# Patient Record
Sex: Female | Born: 1945 | Race: White | Hispanic: No | Marital: Married | Smoking: Current every day smoker
Health system: Southern US, Community
[De-identification: ages and names within clinical notes are randomized; demographics above are authoritative.]

## PROBLEM LIST (undated history)

## (undated) DIAGNOSIS — IMO0002 Reserved for concepts with insufficient information to code with codable children: Secondary | ICD-10-CM

## (undated) DIAGNOSIS — J449 Chronic obstructive pulmonary disease, unspecified: Secondary | ICD-10-CM

## (undated) DIAGNOSIS — M1990 Unspecified osteoarthritis, unspecified site: Secondary | ICD-10-CM

## (undated) DIAGNOSIS — I10 Essential (primary) hypertension: Secondary | ICD-10-CM

## (undated) DIAGNOSIS — G894 Chronic pain syndrome: Secondary | ICD-10-CM

## (undated) DIAGNOSIS — E039 Hypothyroidism, unspecified: Secondary | ICD-10-CM

## (undated) DIAGNOSIS — G4700 Insomnia, unspecified: Secondary | ICD-10-CM

## (undated) DIAGNOSIS — K219 Gastro-esophageal reflux disease without esophagitis: Secondary | ICD-10-CM

## (undated) DIAGNOSIS — F32A Depression, unspecified: Secondary | ICD-10-CM

## (undated) DIAGNOSIS — R002 Palpitations: Secondary | ICD-10-CM

## (undated) DIAGNOSIS — F329 Major depressive disorder, single episode, unspecified: Secondary | ICD-10-CM

## (undated) DIAGNOSIS — G459 Transient cerebral ischemic attack, unspecified: Secondary | ICD-10-CM

## (undated) DIAGNOSIS — G2581 Restless legs syndrome: Secondary | ICD-10-CM

## (undated) DIAGNOSIS — F419 Anxiety disorder, unspecified: Secondary | ICD-10-CM

## (undated) DIAGNOSIS — E785 Hyperlipidemia, unspecified: Secondary | ICD-10-CM

## (undated) HISTORY — DX: Gastro-esophageal reflux disease without esophagitis: K21.9

## (undated) HISTORY — DX: Restless legs syndrome: G25.81

## (undated) HISTORY — PX: CERVICAL SPINE SURGERY: SHX589

## (undated) HISTORY — DX: Reserved for concepts with insufficient information to code with codable children: IMO0002

## (undated) HISTORY — PX: CATARACT EXTRACTION, BILATERAL: SHX1313

## (undated) HISTORY — DX: Unspecified osteoarthritis, unspecified site: M19.90

## (undated) HISTORY — DX: Insomnia, unspecified: G47.00

## (undated) HISTORY — DX: Hyperlipidemia, unspecified: E78.5

## (undated) HISTORY — DX: Palpitations: R00.2

## (undated) HISTORY — DX: Transient cerebral ischemic attack, unspecified: G45.9

## (undated) HISTORY — DX: Anxiety disorder, unspecified: F41.9

## (undated) HISTORY — DX: Chronic pain syndrome: G89.4

## (undated) HISTORY — PX: APPENDECTOMY: SHX54

## (undated) HISTORY — PX: LUMBAR SPINE SURGERY: SHX701

## (undated) HISTORY — DX: Essential (primary) hypertension: I10

## (undated) HISTORY — PX: ABDOMINAL HYSTERECTOMY: SHX81

---

## 1998-04-08 ENCOUNTER — Inpatient Hospital Stay (HOSPITAL_COMMUNITY): Admission: RE | Admit: 1998-04-08 | Discharge: 1998-04-09 | Payer: Self-pay | Admitting: Neurosurgery

## 1999-07-26 ENCOUNTER — Ambulatory Visit (HOSPITAL_BASED_OUTPATIENT_CLINIC_OR_DEPARTMENT_OTHER): Admission: RE | Admit: 1999-07-26 | Discharge: 1999-07-26 | Payer: Self-pay | Admitting: Orthopedic Surgery

## 1999-08-09 ENCOUNTER — Ambulatory Visit (HOSPITAL_COMMUNITY): Admission: RE | Admit: 1999-08-09 | Discharge: 1999-08-09 | Payer: Self-pay | Admitting: Orthopedic Surgery

## 1999-08-09 ENCOUNTER — Encounter: Payer: Self-pay | Admitting: Orthopedic Surgery

## 1999-09-11 ENCOUNTER — Ambulatory Visit (HOSPITAL_COMMUNITY): Admission: RE | Admit: 1999-09-11 | Discharge: 1999-09-11 | Payer: Self-pay | Admitting: Neurosurgery

## 1999-10-06 ENCOUNTER — Ambulatory Visit (HOSPITAL_COMMUNITY): Admission: RE | Admit: 1999-10-06 | Discharge: 1999-10-06 | Payer: Self-pay | Admitting: Neurosurgery

## 1999-10-25 ENCOUNTER — Encounter: Admission: RE | Admit: 1999-10-25 | Discharge: 1999-10-25 | Payer: Self-pay | Admitting: Neurosurgery

## 2000-02-07 ENCOUNTER — Ambulatory Visit (HOSPITAL_COMMUNITY): Admission: RE | Admit: 2000-02-07 | Discharge: 2000-02-07 | Payer: Self-pay | Admitting: Neurosurgery

## 2000-12-21 ENCOUNTER — Encounter: Admission: RE | Admit: 2000-12-21 | Discharge: 2001-03-21 | Payer: Self-pay | Admitting: Neurosurgery

## 2002-07-24 ENCOUNTER — Inpatient Hospital Stay (HOSPITAL_COMMUNITY): Admission: RE | Admit: 2002-07-24 | Discharge: 2002-07-26 | Payer: Self-pay | Admitting: Neurosurgery

## 2003-06-25 ENCOUNTER — Ambulatory Visit (HOSPITAL_COMMUNITY): Admission: RE | Admit: 2003-06-25 | Discharge: 2003-06-25 | Payer: Self-pay | Admitting: Neurosurgery

## 2005-09-01 ENCOUNTER — Ambulatory Visit: Payer: Self-pay | Admitting: Internal Medicine

## 2005-10-13 ENCOUNTER — Ambulatory Visit: Payer: Self-pay | Admitting: Internal Medicine

## 2005-10-13 ENCOUNTER — Encounter (INDEPENDENT_AMBULATORY_CARE_PROVIDER_SITE_OTHER): Payer: Self-pay | Admitting: Specialist

## 2005-12-16 ENCOUNTER — Emergency Department (HOSPITAL_COMMUNITY): Admission: EM | Admit: 2005-12-16 | Discharge: 2005-12-16 | Payer: Self-pay | Admitting: *Deleted

## 2006-01-08 ENCOUNTER — Inpatient Hospital Stay (HOSPITAL_COMMUNITY): Admission: RE | Admit: 2006-01-08 | Discharge: 2006-01-10 | Payer: Self-pay | Admitting: Neurosurgery

## 2008-02-05 ENCOUNTER — Ambulatory Visit (HOSPITAL_COMMUNITY): Admission: RE | Admit: 2008-02-05 | Discharge: 2008-02-05 | Payer: Self-pay | Admitting: Neurosurgery

## 2008-02-19 ENCOUNTER — Ambulatory Visit (HOSPITAL_COMMUNITY): Admission: RE | Admit: 2008-02-19 | Discharge: 2008-02-19 | Payer: Self-pay | Admitting: Neurosurgery

## 2008-04-16 ENCOUNTER — Inpatient Hospital Stay (HOSPITAL_COMMUNITY): Admission: RE | Admit: 2008-04-16 | Discharge: 2008-04-20 | Payer: Self-pay | Admitting: Neurosurgery

## 2008-10-21 ENCOUNTER — Ambulatory Visit: Payer: Self-pay | Admitting: Internal Medicine

## 2008-11-02 ENCOUNTER — Ambulatory Visit: Payer: Self-pay | Admitting: Internal Medicine

## 2008-11-02 ENCOUNTER — Encounter: Payer: Self-pay | Admitting: Internal Medicine

## 2008-11-04 ENCOUNTER — Encounter: Payer: Self-pay | Admitting: Internal Medicine

## 2008-11-06 ENCOUNTER — Encounter: Admission: RE | Admit: 2008-11-06 | Discharge: 2008-11-06 | Payer: Self-pay | Admitting: Neurosurgery

## 2008-12-29 ENCOUNTER — Encounter (INDEPENDENT_AMBULATORY_CARE_PROVIDER_SITE_OTHER): Payer: Self-pay | Admitting: *Deleted

## 2009-01-25 ENCOUNTER — Ambulatory Visit: Payer: Self-pay | Admitting: Internal Medicine

## 2009-02-11 ENCOUNTER — Encounter: Payer: Self-pay | Admitting: Internal Medicine

## 2009-02-11 ENCOUNTER — Ambulatory Visit: Payer: Self-pay | Admitting: Internal Medicine

## 2009-02-15 ENCOUNTER — Encounter: Payer: Self-pay | Admitting: Internal Medicine

## 2009-03-22 ENCOUNTER — Inpatient Hospital Stay (HOSPITAL_COMMUNITY): Admission: AD | Admit: 2009-03-22 | Discharge: 2009-03-24 | Payer: Self-pay | Admitting: Internal Medicine

## 2009-03-23 ENCOUNTER — Ambulatory Visit: Payer: Self-pay | Admitting: Vascular Surgery

## 2009-03-23 ENCOUNTER — Encounter (INDEPENDENT_AMBULATORY_CARE_PROVIDER_SITE_OTHER): Payer: Self-pay | Admitting: *Deleted

## 2009-05-31 ENCOUNTER — Encounter: Admission: RE | Admit: 2009-05-31 | Discharge: 2009-05-31 | Payer: Self-pay | Admitting: Neurosurgery

## 2009-09-06 ENCOUNTER — Encounter: Admission: RE | Admit: 2009-09-06 | Discharge: 2009-09-06 | Payer: Self-pay | Admitting: Neurological Surgery

## 2010-01-18 ENCOUNTER — Encounter: Admission: RE | Admit: 2010-01-18 | Discharge: 2010-01-18 | Payer: Self-pay | Admitting: Neurosurgery

## 2010-02-07 ENCOUNTER — Encounter: Admission: RE | Admit: 2010-02-07 | Discharge: 2010-02-07 | Payer: Self-pay | Admitting: Neurosurgery

## 2010-03-25 ENCOUNTER — Encounter: Admission: RE | Admit: 2010-03-25 | Discharge: 2010-03-25 | Payer: Self-pay | Admitting: Neurosurgery

## 2010-08-18 ENCOUNTER — Encounter: Payer: Self-pay | Admitting: Nurse Practitioner

## 2010-10-20 ENCOUNTER — Encounter: Payer: Self-pay | Admitting: Nurse Practitioner

## 2010-10-24 ENCOUNTER — Encounter (INDEPENDENT_AMBULATORY_CARE_PROVIDER_SITE_OTHER): Payer: Self-pay | Admitting: *Deleted

## 2010-10-25 ENCOUNTER — Telehealth: Payer: Self-pay | Admitting: Internal Medicine

## 2010-10-27 ENCOUNTER — Encounter: Payer: Self-pay | Admitting: Internal Medicine

## 2010-10-27 ENCOUNTER — Ambulatory Visit
Admission: RE | Admit: 2010-10-27 | Discharge: 2010-10-27 | Payer: Self-pay | Source: Home / Self Care | Attending: Gastroenterology | Admitting: Gastroenterology

## 2010-10-27 DIAGNOSIS — K219 Gastro-esophageal reflux disease without esophagitis: Secondary | ICD-10-CM | POA: Insufficient documentation

## 2010-10-27 DIAGNOSIS — K146 Glossodynia: Secondary | ICD-10-CM | POA: Insufficient documentation

## 2010-10-27 DIAGNOSIS — R1084 Generalized abdominal pain: Secondary | ICD-10-CM | POA: Insufficient documentation

## 2010-10-27 DIAGNOSIS — G8929 Other chronic pain: Secondary | ICD-10-CM | POA: Insufficient documentation

## 2010-10-27 DIAGNOSIS — Z860100 Personal history of colon polyps, unspecified: Secondary | ICD-10-CM | POA: Insufficient documentation

## 2010-10-27 DIAGNOSIS — R1319 Other dysphagia: Secondary | ICD-10-CM | POA: Insufficient documentation

## 2010-10-27 DIAGNOSIS — M545 Low back pain, unspecified: Secondary | ICD-10-CM | POA: Insufficient documentation

## 2010-10-28 ENCOUNTER — Telehealth: Payer: Self-pay | Admitting: Nurse Practitioner

## 2010-11-04 ENCOUNTER — Encounter
Admission: RE | Admit: 2010-11-04 | Discharge: 2010-11-04 | Payer: Self-pay | Source: Home / Self Care | Attending: Neurosurgery | Admitting: Neurosurgery

## 2010-11-15 ENCOUNTER — Other Ambulatory Visit: Payer: Self-pay | Admitting: Internal Medicine

## 2010-11-15 ENCOUNTER — Ambulatory Visit
Admission: RE | Admit: 2010-11-15 | Discharge: 2010-11-15 | Payer: Self-pay | Source: Home / Self Care | Attending: Internal Medicine | Admitting: Internal Medicine

## 2010-11-15 DIAGNOSIS — D1330 Benign neoplasm of unspecified part of small intestine: Secondary | ICD-10-CM

## 2010-11-17 NOTE — Letter (Signed)
Summary: New Patient letter  [facility]  [address] [place], [place] [address]   Phone: [phone]  Fax: [phone]       10/24/2010 MRN: [id number]  [facility] [address] [place], [place]  [address]  Dear Ms. Pantaleon,  Welcome to the Gastroenterology Division at [facility].    You are scheduled to see Dr.  Marina Goodell on 12-05-10 at 11:00a.m. on the 3rd floor at [facility], [address]  We ask that you try to arrive at our office 15 minutes prior to your appointment time to allow for check-in.  We would like you to complete the enclosed self-administered evaluation form prior to your visit and bring it with you on the day of your appointment.  We will review it with you.  Also, please bring a complete list of all your medications or, if you prefer, bring the medication bottles and we will list them.  Please bring your insurance card so that we may make a copy of it.  If your insurance requires a referral to see a specialist, please bring your referral form from your primary care physician.  Co-payments are due at the time of your visit and may be paid by cash, check or credit card.     Your office visit will consist of a consult with your physician (includes a physical exam), any laboratory testing he/she may order, scheduling of any necessary diagnostic testing (e.g. x-ray, ultrasound, CT-scan), and scheduling of a procedure (e.g. Endoscopy, Colonoscopy) if required.  Please allow enough time on your schedule to allow for any/all of these possibilities.    If you cannot keep your appointment, please call [phone] to cancel or reschedule prior to your appointment date.  This allows [place] the opportunity to schedule an appointment for another patient in need of care.  If you do not cancel or reschedule by 5 p.m. the business day prior to your appointment date, you will be charged a $50.00 late cancellation/no-show fee.    Thank you for choosing [facility] for your medical needs.  We appreciate the opportunity to care for you.  Please visit [place] at our website  to learn more about our practice.                     Sincerely,                                                             The Gastroenterology Division

## 2010-11-17 NOTE — Progress Notes (Signed)
Summary: Triage  Phone Note Call from Patient Call back at Home Phone [phone]   Caller: Patient Call For: Dr. Marina Goodell Reason for Call: Talk to Nurse Summary of Call: Abd pain, vomiting, rash on her tongue. Seen ENT and it is not working. Requesting sooner appt. than her appt on 12-05-10 Initial call taken by: Karna Christmas,  October 25, 2010 9:12 AM  Follow-up for Phone Call        Given appt. with N..P. for Thursday.She will have records from ENT faxed to [facility]. Follow-up by: Teryl Lucy RN,  October 25, 2010 10:41 AM

## 2010-11-17 NOTE — Progress Notes (Signed)
Summary: Prep ?s  Phone Note Call from Patient Call back at Home Phone [phone]   Caller: Patient Call For: Gunnar Fusi Reason for Call: Talk to Nurse Summary of Call: Pt says she can not drink her prep she wants another option Initial call taken by: [place] Johnson,  October 28, 2010 10:25 AM  Follow-up for Phone Call        Pt complained about the cost of the Prep. She was told by the pharmacy it was 67.00 dollars.  I told her it is the prep the Doctor's here prefer because it really cleans out the colon if the pt drinks the entire prep.  I told her we do not  have a sample for everyone.  I did tell her I would put 1 at the front desk for her.  She said it would be Monday before she gets here. She was grateful for the sample. Follow-up by: Joselyn Glassman,  October 28, 2010 11:35 AM

## 2010-11-17 NOTE — Letter (Signed)
Summary: [facility] Instructions  [facility]  [address] [place], [place] [address]   Phone: [phone]  Fax: [phone]       Emily Leonard    [birth year]    MRN: [id number]        Procedure Day /Date:11-15-2010     Arrival Time:9:30 AM      Procedure Time:10:30 AM     Location of Procedure:                         [facility] (4th Floor)  PREPARATION FOR COLONOSCOPY WITH MOVIPREP   Starting 5 days prior to your procedure 11-10-2010  do not eat nuts, seeds, popcorn, corn, beans, peas,  salads, or any raw vegetables.  Do not take any fiber supplements (e.g. Metamucil, Citrucel, and Benefiber).  THE DAY BEFORE YOUR PROCEDURE         DATE:11-09-2010 DAY: Monday  1.  Drink clear liquids the entire day-NO SOLID FOOD  2.  Do not drink anything colored red or purple.  Avoid juices with pulp.  No orange juice.  3.  Drink at least 64 oz. (8 glasses) of fluid/clear liquids during the day to prevent dehydration and help the prep work efficiently.  CLEAR LIQUIDS INCLUDE: Water Jello Ice Popsicles Tea (sugar ok, no milk/cream) Powdered fruit flavored drinks Coffee (sugar ok, no milk/cream) Gatorade Juice: apple, white grape, white cranberry  Lemonade Clear bullion, consomm, broth Carbonated beverages (any kind) Strained chicken noodle soup Hard Candy                             4.  In the morning, mix first dose of MoviPrep solution:    Empty 1 Pouch A and 1 Pouch B into the disposable container    Add lukewarm drinking water to the top line of the container. Mix to dissolve    Refrigerate (mixed solution should be used within 24 hrs)  5.  Begin drinking the prep at 5:00 p.m. The MoviPrep container is divided by 4 marks.   Every 15 minutes drink the solution down to the next mark (approximately 8 oz) until the full liter is complete.   6.  Follow completed prep with 16 oz of clear liquid of your choice (Nothing red or purple).  Continue to  drink clear liquids until bedtime.  7.  Before going to bed, mix second dose of MoviPrep solution:    Empty 1 Pouch A and 1 Pouch B into the disposable container    Add lukewarm drinking water to the top line of the container. Mix to dissolve    Refrigerate  THE DAY OF YOUR PROCEDURE      DATE: 11-15-2010 DAY: Tuesday  Beginning at 5:30 A.M.  (5 hours before procedure):         1. Every 15 minutes, drink the solution down to the next mark (approx 8 oz) until the full liter is complete.  2. Follow completed prep with 16 oz. of clear liquid of your choice.    3. You may drink clear liquids until 8:30 AM  (2 HOURS BEFORE PROCEDURE).    MEDICATION INSTRUCTIONS  Unless otherwise instructed, you should take regular prescription medications with a small sip of water   as early as possible the morning of your procedure.        OTHER INSTRUCTIONS  You will need a responsible adult at least 66  years of age to accompany you and drive you home.   This person must remain in the waiting room during your procedure.  Wear loose fitting clothing that is easily removed.  Leave jewelry and other valuables at home.  However, you may wish to bring a book to read or  an iPod/MP3 player to listen to music as you wait for your procedure to start.  Remove all body piercing jewelry and leave at home.  Total time from sign-in until discharge is approximately 2-3 hours.  You should go home directly after your procedure and rest.  You can resume normal activities the  day after your procedure.  The day of your procedure you should not:   Drive   Make legal decisions   Operate machinery   Drink alcohol   Return to work  You will receive specific instructions about eating, activities and medications before you leave.    The above instructions have been reviewed and explained to me by   _______________________    I fully understand and can verbalize these instructions  _____________________________ Date _________

## 2010-11-17 NOTE — Assessment & Plan Note (Signed)
Summary: dysphagia--ch./Dr.perry pt.   History of Present Illness Visit Type: Initial Consult Primary GI MD: Yancey Flemings MD Primary Provider: Philemon Kingdom, MD Requesting Provider: Philemon Kingdom, MD Chief Complaint: dysphagia History of Present Illness:   Patient followed by Dr. Marina Goodell for history of colon polyps. She was last seen at the time of her colonoscopy April 2010.   Patient presents today with several GI problems. First, she complains of a constantly burning tongue. Her tongue feels raw and intermittently feels swollen.  She can not correlate onset of symptoms with any of her medications. The only medication started since onset of symptoms a few months ago was Hydromorphone. Saw an ENT in [place] (she doesn't remember who) and was given Dukes Mouthwash but failed to improved. She complains of solid food getting stuck high up in her throat. The food sometimes comes back in her mouth. She complains of pyrosis. She takes Nexium on a daily basis.  Patient complains of nagging mid abdominal pain, not necessarily worse with meals. Pain several months ago, around the same time as her upper GI problems. BMs are normal. No rectal bleeding. Her weight is up several pounds.    GI Review of Systems    Reports abdominal pain, acid reflux, belching, chest pain, dysphagia with liquids, dysphagia with solids, and  nausea.     Location of  Abdominal pain: mid abdomen.    Denies bloating, heartburn, loss of appetite, vomiting, vomiting blood, weight loss, and  weight gain.        Denies anal fissure, black tarry stools, change in bowel habit, constipation, diarrhea, diverticulosis, fecal incontinence, heme positive stool, hemorrhoids, irritable bowel syndrome, jaundice, light color stool, liver problems, rectal bleeding, and  rectal pain. Preventive Screening-Counseling & Management      Drug Use:  no.      Current Medications (verified): 1)  Fluoxetine Hcl 20 Mg Caps (Fluoxetine Hcl)  .... 3 Caps in The Am , Orally Once Daily 2)  Calcium 600 Mg Tabs (Calcium) .... Take 2 Tab Daily 3)  Fish Oil 1000 Mg Caps (Omega-3 Fatty Acids) .... Take 1 Cap Daily 4)  Aspirin 81 Mg Tbec (Aspirin) .... Take 1 Tab Daily 5)  Synthroid 112 Mcg Tabs (Levothyroxine Sodium) .... Take 1 Tab in The Am On Empty Stomach 6)  Meloxicam 15 Mg Tabs (Meloxicam) .... Take 1 Tab Daily For Pain and Inflammation 7)  Pravastatin Sodium 20 Mg Tabs (Pravastatin Sodium) .... Take 1 Tab The Am 8)  Alendronate Sodium 70 Mg Tabs (Alendronate Sodium) .... Take 1 Tab Once A Week For Bones 9)  Orphenadrine Citrate Cr 100 Mg Xr12h-Tab (Orphenadrine Citrate) .... Take 1 Tab Every 12 Hours As Needed For Leg Cramps 10)  Estradiol 1 Mg Tabs (Estradiol) .... Take 1 Tab Daily 11)  Ranitidine Hcl 150 Mg Caps (Ranitidine Hcl) .... Take 1 Tab Twice Daily 12)  Hydrochlorothiazide 25 Mg Tabs (Hydrochlorothiazide) .... Take 1 Tab Once Daily 13)  Neurontin 600 Mg Tabs (Gabapentin) .... Take 1 Cap 3 Times Daily For Nerve Pain 14)  Robaxin-750 750 Mg Tabs (Methocarbamol) .... Take 1 Tab 4 Times Daily As Needed 15)  Nexium 40 Mg Cpdr (Esomeprazole Magnesium) .... Take 1 Cap Once Daily 16)  Trazodone Hcl 50 Mg Tabs (Trazodone Hcl) .... Take 1-3 Tab At Bedtime Orally Once Daily 17)  Clonazepam 1 Mg Tabs (Clonazepam) .... Take 1 Tab Orally Once Daily 18)  Hydromorphone Hcl 4 Mg Tabs (Hydromorphone Hcl) .... Take As Directed 19)  Ropinirole  Hcl 2 Mg Tabs (Ropinirole Hcl) .... Take 1 Tab Once Daily  Allergies (verified): 1)  ! Erythromycin 2)  ! * Latex 3)  ! Morphine Sulfate (Morphine Sulfate)  Past History:  Past Medical History: Hypertension TIA Thyroid Disease Arthritis History of UTI Anxiety Disorder Tubular Adenoma- Colon Polyps Depression  Past Surgical History: Reviewed history from 10/26/2010 and no changes required. Hysterectomy 1978 Appendectomy 2010 Lumbar spine surgery fusion Cervical spine surgery  fusion  Family History: Reviewed history from 10/26/2010 and no changes required. Father- deceased 74 years, Hypertension, MI Mother- deceased 1 years Hypertension, stroke, breast cancer Son- 1 alive Daughters- 2 alive Spouse- alive 5 yrs No FH of Colon Cancer:  Social History: Reviewed history from 10/26/2010 and no changes required. Smoked 41 years- smokes 31 cig a day Alcohol Use - no Daily Caffeine Use  3 per day Illicit Drug Use - no Married 2 boys and 2 girlsDrug Use:  no  Review of Systems       The patient complains of arthritis/joint pain, back pain, change in vision, cough, heart rhythm changes, muscle pains/cramps, shortness of breath, and sore throat.  The patient denies allergy/sinus, anemia, blood in urine, breast changes/lumps, confusion, coughing up blood, fainting, fatigue, fever, headaches-new, hearing problems, heart murmur, itching, menstrual pain, night sweats, nosebleeds, pregnancy symptoms, skin rash, sleeping problems, swelling of feet/legs, swollen lymph glands, thirst - excessive , urination - excessive , urination changes/pain, urine leakage, vision changes, and voice change.    Vital Signs:  Patient profile:   65 year old female Height:      64 inches Weight:      155.4 pounds BMI:     26.77 Pulse rate:   64 / minute Pulse rhythm:   regular BP sitting:   122 / 68  (left arm)  Vitals Entered By: Milford Cage NCMA (October 27, 2010 8:24 AM)  Physical Exam  General:  Well developed, well nourished, no acute distress. Head:  Normocephalic and atraumatic. Eyes:  Conjunctiva pink, no icterus.  Mouth:  No deformity or lesions Neck:  no obvious masses  Lungs:  Clear throughout to auscultation. Heart:  Regular rate and rhythm; no murmurs, rubs,  or bruits. Abdomen:  Abdomen soft,  nondistended. Mild periumbilical tenderness.  No obvious masses or hepatomegaly.Normal bowel sounds.  Msk:  Symmetrical with no gross deformities. Normal  posture. Extremities:  No palmar erythema, no edema.  Neurologic:  Alert and  oriented x4;  grossly normal neurologically. Skin:  Intact without significant lesions or rashes. Cervical Nodes:  No significant cervical adenopathy. Psych:  Alert and cooperative. Normal mood and affect.   Impression & Recommendations:  Problem # 1:  DYSPHAGIA (ZOX-096.04) Assessment New Several month history of solid food dysphagia. On daily Nexium. She is having breakthrough pyrosis, burning tongue. Trial of Dukes Mouthwash by ENT was not helpful. We are trying to determine which ENT patient saw and obtain those records. For further evaluation the patient will be scheduled for an EGD with biopsies/ esophageal dilation ( if indicated).  The risks and benefits of the procedure, as well as alternatives were discussed with the patient and she agrees to proceed. Of note, the patient is chronic pain medications and may need Propofol for sedation. I will talk with Dr. Marina Goodell, the patient's primary gastroenterologist, and see what he recommends as far as sedation.   Orders: Colon/Endo (Colon/Endo)  Problem # 2:  GERD (ICD-530.81) Assessment: Deteriorated Breakthrough GERD symptoms on daily Nexium.  Her pyrosis is in  the setting of dysphagia and complaints of a burning tongue. See #1.   Problem # 3:  GLOSSODYNIA (ICD-529.6) Assessment: New  Previously evaluated by ENT. No obvious tongue abnormalities on exam (normal appearing papillae, normal color, normal size). It could be one of her medications. She could have a vitamin deficiency but again her tongue appears normal on exam. Etiology is not clear to me.  Problem # 4:  ABDOMINAL PAIN -GENERALIZED (ICD-789.07) Assessment: New Mid abdominal discomfort (nagging), unrelated to meals or defecation.  Pain doesn't wake her up at night, her weight is stable, BMs normal. Her abdominal exam is not overly concerning. Patient does take a daily NSAID and PUD is a consideration  but location of pain atypical for PUD.  If pain persists and EGD is negative, she may need imaging.   Problem # 5:  PERSONAL HX COLONIC POLYPS (ICD-V12.72) Assessment: Comment Only Patient has a history of adenomatous colon polyps and is due for surveillance examination in April 2012. If patient requires Propofol for her upcoming EGD Dr. Marina Goodell may wish to do her colonoscopy at the same time. I will discuss this him.   Problem # 6:  BACK PAIN, CHRONIC (ICD-724.5) Assessment: Comment Only On chronic pain medications and muscle relaxers.  Patient Instructions: 1)  Chew your food well. 2)  We have scheduled the Endoscopy/Colonoscopy with Dr. Marina Goodell on 10-28-2010.Marland Kitchen 3)  Directions and brochure provided. 4)  [facility] Patient Information Guide given to patient. 5)  We sent the perscription for the colonoscopy prep you will be drinking to [facility], [place].   6)  We have given you a mail-in rebate for $20 off your prep.  7)  Copy sent to : Dr. Philemon Kingdom 8)  The medication list was reviewed and reconciled.  All changed / newly prescribed medications were explained.  A complete medication list was provided to the patient / caregiver. Prescriptions: MOVIPREP 100 GM  SOLR (PEG-KCL-NACL-NASULF-NA ASC-C) As per prep instructions.  #1 x 0   Entered by:   Lowry Ram NCMA   Authorized by:   Willette Cluster NP   Signed by:   Lowry Ram NCMA on 10/27/2010   Method used:   Electronically to        [facility] [id number] (retail)       [address] [place] [address] [place], [place]  [address]       Ph: [phone]       Fax: [phone]   RxID:   [id number]

## 2010-11-17 NOTE — Letter (Signed)
Summary: Orest Dikes MD/[facility]  Orest Dikes MD/[facility]   Imported By: Lester [place] 11/07/2010 08:12:11  _____________________________________________________________________  External Attachment:    Type:   Image     Comment:   External Document

## 2010-11-17 NOTE — Letter (Signed)
Summary: Cruzita Lederer MD/[facility]  Cruzita Lederer MD/[facility]   Imported By: Lester [place] 11/07/2010 08:13:52  _____________________________________________________________________  External Attachment:    Type:   Image     Comment:   External Document

## 2010-11-21 ENCOUNTER — Encounter: Payer: Self-pay | Admitting: Internal Medicine

## 2010-11-23 NOTE — Miscellaneous (Signed)
Summary: trial of Mycelex  Clinical Lists Changes  Medications: Added new medication of MYCELEX 10 MG TROC (CLOTRIMAZOLE) One 5x daily until gone - Signed Rx of MYCELEX 10 MG TROC (CLOTRIMAZOLE) One 5x daily until gone;  #50 x 0;  Signed;  Entered by: Doristine Church RN II;  Authorized by: Hilarie Fredrickson MD;  Method used: Electronically to [facility] [address] [place] [address] [place], [place]  [address], Ph: [phone], Fax: [phone] Observations: Added new observation of ALLERGY REV: Done (11/15/2010 11:36)    Prescriptions: MYCELEX 10 MG TROC (CLOTRIMAZOLE) One 5x daily until gone  #50 x 0   Entered by:   Doristine Church RN II   Authorized by:   Hilarie Fredrickson MD   Signed by:   Doristine Church RN II on 11/15/2010   Method used:   Electronically to        [facility] [id number] (retail)       [address] [place] [address] [place], [place]  [address]       Ph: [phone]       Fax: [phone]   RxID:   [id number]

## 2010-11-23 NOTE — Procedures (Addendum)
Summary: Upper Endoscopy  Patient: Meilin Brosh Note: All result statuses are Final unless otherwise noted.  Tests: (1) Upper Endoscopy (EGD)   EGD Upper Endoscopy       DONE     [facility]     [address]     [place], [place]  [address]           ENDOSCOPY PROCEDURE REPORT           PATIENT:  Emmajean, Ratledge  MR#:  [id number]     BIRTHDATE:  [birth year], 64 yrs. old  GENDER:  female           ENDOSCOPIST:  Wilhemina Bonito. Eda Keys, MD     Referred by:  Office           PROCEDURE DATE:  11/15/2010     PROCEDURE:  EGD, diagnostic 19147     ASA CLASS:  Class II     INDICATIONS:  GERD, dysphagia ; burning tongue           MEDICATIONS:   MAC sedation, administered by CRNA, propofol     (Diprivan) 140 mg IV     TOPICAL ANESTHETIC:  Exactacain Spray           DESCRIPTION OF PROCEDURE:   After the risks benefits and     alternatives of the procedure were thoroughly explained, informed     consent was obtained.  The LB [id number]-H180 [id number] endoscope was     introduced through the mouth and advanced to the second portion of     the duodenum, without limitations.  The instrument was slowly     withdrawn as the mucosa was fully examined.     <<PROCEDUREIMAGES>>           The upper, middle, and distal third of the esophagus were     carefully inspected and no abnormalities were noted. The z-line     was well seen at the GEJ. The endoscope was pushed into the fundus     which was normal including a retroflexed view. The antrum,gastric     body, first and second part of the duodenum were unremarkable.     Retroflexed views revealed no abnormalities.    The scope was then     withdrawn from the patient and the procedure completed.           COMPLICATIONS:  None           ENDOSCOPIC IMPRESSION:     1) Normal EGD     2) Gerd           RECOMMENDATIONS:     1) Continue Reflux medication     2) Trial of Mycelex troches; #50; 0ne 5x daily until gone     3) See your ENT doctor if symptoms  presist           ______________________________     Wilhemina Bonito. Eda Keys, MD           CC:  The Patient; Philemon Kingdom MD           n.     eSIGNED:   Wilhemina Bonito. Eda Keys at 11/15/2010 11:28 AM           Arty Baumgartner, [phone]  Note: An exclamation mark (!) indicates a result that was not dispersed into the flowsheet. Document Creation Date: 11/15/2010 11:28 AM _______________________________________________________________________  (1) Order result status: Final Collection or observation date-time: 11/15/2010 11:11 Requested date-time:  Receipt date-time:  Reported date-time:  Referring Physician:   Ordering Physician: Fransico Setters [phone]) Specimen Source:  Source: Launa Grill Order Number: [id number] Lab site:

## 2010-11-23 NOTE — Procedures (Addendum)
Summary: Colonoscopy  Patient: Emily Leonard Note: All result statuses are Final unless otherwise noted.  Tests: (1) Colonoscopy (COL)   COL Colonoscopy           DONE     [facility]     [address]     [place], [place]  [address]           COLONOSCOPY PROCEDURE REPORT           PATIENT:  [name], [name]  MR#:  [id number]     BIRTHDATE:  [birth year], 64 yrs. old  GENDER:  female     ENDOSCOPIST:  Wilhemina Bonito. Eda Keys, MD     REF. BY:  Office     PROCEDURE DATE:  11/15/2010     PROCEDURE:  Colonoscopy with snare polypectomy X 2     ASA CLASS:  Class II     INDICATIONS:  history of pre-cancerous (adenomatous) colon polyps,     surveillance and high-risk screening ; PRIOR EXAMS 2003,6,10,10 W/     MULTIPLE AND LARGE POLYPS     MEDICATIONS:   MAC sedation, administered by CRNA, propofol     (Diprivan) 230 mg IV           DESCRIPTION OF PROCEDURE:   After the risks benefits and     alternatives of the procedure were thoroughly explained, informed     consent was obtained.  Digital rectal exam was performed and     revealed no abnormalities.   The LB 180AL [id number] endoscope was     introduced through the anus and advanced to the cecum, which was     identified by both the appendix and ileocecal valve, without     limitations.TIME TO CECUM = 5:29 MIN.  The quality of the prep was     good, using MoviPrep.  The instrument was then slowly withdrawn     (TIME = 16:08 MIN) as the colon was fully examined.     <<PROCEDUREIMAGES>>           FINDINGS:  An 8mm sessile polyp was found in the cecum. Polyp was     snared without cautery. Retrieval was successful.   A diminutive     polyp was found in the mid transverse colon. Polyp was snared     without cautery. Retrieval was successful.  Moderate     diverticulosis was found throughout the colon.   Retroflexed views     in the rectum revealed no abnormalities.    The scope was then     withdrawn from the patient and the procedure  completed.           COMPLICATIONS:  None     ENDOSCOPIC IMPRESSION:     1) Sessile polyp in the cecum - removed     2) Diminutive polyp in the mid transverse colon - removed     3) Moderate diverticulosis throughout the colon           RECOMMENDATIONS:     1) Repeat Colonoscopy in 3 years.     ______________________________     Wilhemina Bonito. Eda Keys, MD           CC:  The Patient;  Philemon Kingdom  MD           n.     eSIGNED:   Wilhemina Bonito. Eda Keys at 11/15/2010 11:20 AM           Arty Baumgartner, [id number]  Note: An exclamation mark (!) indicates a result that was not dispersed into the flowsheet. Document Creation Date: 11/15/2010 11:21 AM _______________________________________________________________________  (1) Order result status: Final Collection or observation date-time: 11/15/2010 11:04 Requested date-time:  Receipt date-time:  Reported date-time:  Referring Physician:   Ordering Physician: Fransico Setters [phone]) Specimen Source:  Source: Launa Grill Order Number: [id number] Lab site:   Appended Document: Colonoscopy recall     Procedures Next Due Date:    Colonoscopy: 10/2013

## 2010-12-01 NOTE — Letter (Signed)
Summary: Patient Notice- Polyp Results  [facility]  [address] [place], [place] [address]   Phone: [phone]  Fax: [phone]        November 21, 2010 MRN: [id number]    [facility] [address] [place], [place]  [address]    Dear Emily Leonard,  I am pleased to inform you that the colon polyp(s) removed during your recent colonoscopy was (were) found to be benign (no cancer detected) upon pathologic examination.  I recommend you have a repeat colonoscopy examination in 3 years to look for recurrent polyps, as having colon polyps increases your risk for having recurrent polyps or even colon cancer in the future.  Should you develop new or worsening symptoms of abdominal pain, bowel habit changes or bleeding from the rectum or bowels, please schedule an evaluation with either your primary care physician or with me.   Additional information/recommendations:  __ No further action with gastroenterology is needed at this time. Please      follow-up with your primary care physician for your other healthcare      needs.    Please call us if you are having persistent problems or have questions about your condition that have not been fully answered at this time.  Sincerely,  Hilarie Fredrickson MD  This letter has been electronically signed by your physician.  Appended Document: Patient Notice- Polyp Results LETTER MAILED

## 2011-01-23 LAB — DIFFERENTIAL
Basophils Absolute: 0.1 10*3/uL (ref 0.0–0.1)
Basophils Relative: 2 % — ABNORMAL HIGH (ref 0–1)
Eosinophils Absolute: 0.2 10*3/uL (ref 0.0–0.7)
Eosinophils Relative: 4 % (ref 0–5)
Lymphocytes Relative: 47 % — ABNORMAL HIGH (ref 12–46)
Lymphs Abs: 2 10*3/uL (ref 0.7–4.0)
Monocytes Absolute: 0.4 10*3/uL (ref 0.1–1.0)
Monocytes Relative: 10 % (ref 3–12)
Neutro Abs: 1.6 10*3/uL — ABNORMAL LOW (ref 1.7–7.7)
Neutrophils Relative %: 38 % — ABNORMAL LOW (ref 43–77)

## 2011-01-23 LAB — URINALYSIS, ROUTINE W REFLEX MICROSCOPIC
Ketones, ur: 15 mg/dL — AB
Nitrite: NEGATIVE
Protein, ur: NEGATIVE mg/dL
Specific Gravity, Urine: 1.011 (ref 1.005–1.030)
Urobilinogen, UA: 0.2 mg/dL (ref 0.0–1.0)
pH: 6 (ref 5.0–8.0)

## 2011-01-23 LAB — CARDIAC PANEL(CRET KIN+CKTOT+MB+TROPI)
CK, MB: 2.1 ng/mL (ref 0.3–4.0)
Relative Index: INVALID (ref 0.0–2.5)
Relative Index: INVALID (ref 0.0–2.5)
Total CK: 63 U/L (ref 7–177)
Total CK: 71 U/L (ref 7–177)
Troponin I: 0.01 ng/mL (ref 0.00–0.06)

## 2011-01-23 LAB — CULTURE, BLOOD (ROUTINE X 2): Culture: NO GROWTH

## 2011-01-23 LAB — CBC
Hemoglobin: 14 g/dL (ref 12.0–15.0)
MCHC: 34.4 g/dL (ref 30.0–36.0)
RBC: 4.5 MIL/uL (ref 3.87–5.11)

## 2011-01-23 LAB — COMPREHENSIVE METABOLIC PANEL
ALT: 12 U/L (ref 0–35)
AST: 17 U/L (ref 0–37)
Albumin: 3.3 g/dL — ABNORMAL LOW (ref 3.5–5.2)
Alkaline Phosphatase: 76 U/L (ref 39–117)
BUN: 11 mg/dL (ref 6–23)
CO2: 24 mEq/L (ref 19–32)
Calcium: 9.3 mg/dL (ref 8.4–10.5)
Chloride: 109 mEq/L (ref 96–112)
Creatinine, Ser: 0.73 mg/dL (ref 0.4–1.2)
GFR calc Af Amer: >60 mL/min (ref >60)
GFR calc non Af Amer: >60 mL/min (ref >60)
Glucose, Bld: 121 mg/dL — ABNORMAL HIGH (ref 70–99)
Potassium: 3.7 mEq/L (ref 3.5–5.1)
Sodium: 142 mEq/L (ref 135–145)
Total Bilirubin: 0.4 mg/dL (ref 0.3–1.2)
Total Protein: 6.2 g/dL (ref 6.0–8.3)

## 2011-01-23 LAB — PHOSPHORUS: Phosphorus: 3.1 mg/dL (ref 2.3–4.6)

## 2011-01-23 LAB — LIPID PANEL
LDL Cholesterol: 96 mg/dL (ref 0–99)
Total CHOL/HDL Ratio: 3.6 RATIO
Triglycerides: 100 mg/dL (ref <150)
VLDL: 20 mg/dL (ref 0–40)

## 2011-01-23 LAB — TSH: TSH: 0.13 u[IU]/mL — ABNORMAL LOW (ref 0.350–4.500)

## 2011-01-23 LAB — PROTIME-INR
INR: 1 (ref 0.00–1.49)
Prothrombin Time: 13.3 s (ref 11.6–15.2)

## 2011-01-23 LAB — URINE CULTURE
Colony Count: NO GROWTH
Culture: NO GROWTH

## 2011-01-23 LAB — MAGNESIUM: Magnesium: 1.9 mg/dL (ref 1.5–2.5)

## 2011-01-23 LAB — APTT: aPTT: 29 s (ref 24–37)

## 2011-01-31 ENCOUNTER — Ambulatory Visit (HOSPITAL_BASED_OUTPATIENT_CLINIC_OR_DEPARTMENT_OTHER): Payer: PRIVATE HEALTH INSURANCE | Admitting: Physical Medicine & Rehabilitation

## 2011-01-31 ENCOUNTER — Encounter: Payer: Medicare Other | Attending: Physical Medicine & Rehabilitation

## 2011-01-31 DIAGNOSIS — M545 Low back pain, unspecified: Secondary | ICD-10-CM | POA: Insufficient documentation

## 2011-01-31 DIAGNOSIS — M25559 Pain in unspecified hip: Secondary | ICD-10-CM | POA: Insufficient documentation

## 2011-01-31 DIAGNOSIS — M533 Sacrococcygeal disorders, not elsewhere classified: Secondary | ICD-10-CM | POA: Insufficient documentation

## 2011-01-31 DIAGNOSIS — M76899 Other specified enthesopathies of unspecified lower limb, excluding foot: Secondary | ICD-10-CM | POA: Insufficient documentation

## 2011-01-31 DIAGNOSIS — M961 Postlaminectomy syndrome, not elsewhere classified: Secondary | ICD-10-CM | POA: Insufficient documentation

## 2011-01-31 DIAGNOSIS — F17200 Nicotine dependence, unspecified, uncomplicated: Secondary | ICD-10-CM | POA: Insufficient documentation

## 2011-02-01 NOTE — Group Therapy Note (Signed)
REASON FOR VISIT:  Back pain and left hip pain.  HISTORY:  A 65 year old female with long history of lumbar surgeries. She states she has had at least 5 surgeries.  Her most recent surgery was L2 through L5 fusion performed by Dr. Delma Officer.  She does not exactly remember when this was performed and I searched E-chart.  She had L4-5 interbody fusion July 24, 2002.  L3-4 PEEK cages, body fusion 2007, bilateral laminectomy, decompression L2-3 and L2-3 fusion as well as re-instrumentation L2 through L5 April 16, 2008.  Her pain is left hip area and left lateral thigh.  The pain is described as sharp, averaging 8/10.  Sleep is fair.  She has undergone multiple injections including L5-S1 translaminar epidural May 31, 2009 and repeated on September 06, 2009.  She underwent a caudal epidural steroid injection January 18, 2010. She underwent L5-S1 transforaminal injection February 07, 2010 and underwent L5-S1 transforaminal repeat March 25, 2010 and then a L1-L2 transforaminal epidural steroid injection November 04, 2010.  Most recent MRI demonstrated a new left-sided L1-2 foraminal protrusion potentially affecting the L1 nerve root which of course prompted the L1-2 transforaminal injection.  She states that these injections have helped her on average about a month and half.  Her chief complaint is her low back.  She has had trochanteric bursa injections in the past with about a 6-week relief for these as well.  Last employed 1999.  REVIEW OF SYSTEMS:  Trouble walking, depression and anxiety.  SOCIAL HISTORY:  Married, lives with her husband, smokes three-quarter pack per day.  FAMILY HISTORY:  Hypertension and psychiatric problems.  PHYSICAL EXAMINATION:  VITAL SIGNS:  Blood pressure 144/83, pulse 56, respirations 18 and O2 sat 96% on room air. GENERAL:  Well-developed, well-nourished female, in no acute distress. Orientation x3.  Affect is alert.  Gait is normal. EXTREMITIES:  Sensation  mildly decreased left S1, sensation to pinprick also 1+ deep tendon reflex left Achilles versus 2+ on the right, 2+ at the bilateral knees, 2+ bilateral biceps, triceps and brachioradialis. Motor strength is 5/5 bilateral upper and lower extremities.  She has no evidence of toe drag or knee instability during gait.  Lumbar spine range of motion extremely limited about 25% normal forward flexion, extension, lateral rotation and bending.  IMPRESSION: 1. Lumbar post laminectomy syndrome, status post L2 through L5 fusion. 2. Sacroiliac dysfunction.  She has tenderness over the PSIS.  Also,     exam demonstrates positive Luisa Hart test in the SI area.  PLAN: 1. We will set her for SI injections. 2. For her trochanteric bursitis, we will set her for TFL stretching     with PT hip girdle strengthening exercises.  I do not think re-     injection needed at this time.  Discussed with the patient, agrees with plan.  We will also check urine drug screen.  Discussed Duragesic patches a potential longer acting medication in place of her oxycodone which she takes q.i.d.     Erick Colace, M.D. Electronically Signed    AEK/MedQ D:  01/31/2011 15:59:51  T:  02/01/2011 02:56:21  Job #:  [id number]  cc:   Cristi Loron, M.D. Fax: [phone]

## 2011-02-28 NOTE — Discharge Summary (Signed)
Emily Leonard, Emily Leonard NO.:  [id number]   MEDICAL RECORD NO.:  [id number]          PATIENT TYPE:  INP   LOCATION:  3020                         FACILITY:  [facility]   PHYSICIAN:  Monte Fantasia, MD  DATE OF BIRTH:  25-Jan-1946   DATE OF ADMISSION:  03/22/2009  DATE OF DISCHARGE:  03/24/2009                               DISCHARGE SUMMARY   PRIMARY CARE PHYSICIAN:  Unassigned, as the patient wants to follow up  with the different primary care.   DISCHARGE DIAGNOSES:  1. Altered mental status with slurred speech which has resolved.  2. Hypertension which is controlled.  3. History of restless leg syndrome.  4. Chronic back pain secondary to degenerative joint disease status      post spinal fusion.  5. Dyslipidemia.  6. Osteoporosis.   MEDICATIONS UPON DISCHARGE:  1. Nabumetone 500 mg p.o. twice daily.  2. Simvastatin 20 mg p.o. nightly.  3. Calcium 600 mg p.o. twice daily.  4. Cymbalta 60 mg p.o. daily.  5. Synthroid 125 mcg p.o. daily.  6. Protonix 80 mg p.o. b.i.d.  7. Estradiol 1 mg p.o. daily.  8. Fish oil 1 g p.o. daily.  9. Aspirin 81 mg p.o. daily.  10.Actonel 35 mg p.o. weekly.  11.Morphine 15 mg p.o. twice daily p.r.n. pain.  12.Senna 2 tablets p.o. nightly.   COURSE DURING THE HOSPITAL STAY:  Emily Leonard, 65 year old Caucasian lady  patient was transferred from [facility] with altered mental  status and slurred speech.  As per the family, the patient on admission  suddenly became more confused, having horrible dreams, and was delirious  and hence was brought into the emergency department.  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  The patient also had a 2-D echo done  which was normal systolic function and normal wall motion.  No regional  wall abnormalities.  No cardiac source of embolism identified with an EF  was normal.  The patient improved well through the stay in the hospital.  All her mood medications like ropinirole, orphenadrine, and gabapentin  was held.  The altered mental status resolved.  Carotid Dopplers were  done, which showed no ICA stenosis bilaterally.  The patient improved  well and now at present the patient is medically stable to be  discharged.  PT evaluation was also done and recommended outpatient PT  followup 3 times a week for 1 week.  The patient at present is medically  stable and can be discharged home.   INVESTIGATIONS DONE DURING THE STAY IN THE HOSPITAL:  A 2-D echo done on  March 23, 2009, conclusions, left ventricle cavity size normal, systolic  function was normal, wall motion  was normal.  No regional wall  abnormalities.  No cardiac source of embolism identified but cannot be  ruled out on the basis of this examination.   RADIOLOGICAL INVESTIGATIONS DONE DURING THE STAY IN THE HOSPITAL:  1. MR brain.  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  MRA of the head,      impression, negative intracranial.  MR angiography with large or      medium-sized vessels.  2. Chest x-ray done on March 23, 2009, impression, stable chest x-ray.      No evidence of acute cardiac or pulmonary processes.   LABORATORIES DONE DURING THE STAY IN THE HOSPITAL:  Total WBC 4.2,  hemoglobin 14.0, hematocrit 40.6, platelets 249.  Prothrombin time 13.3,  INR 1.0, PTT 29.  Sodium 142, potassium 3.7, chloride 109, bicarb 24,  glucose 121, BUN 11, creatinine 0.73, total bilirubin 0.4, alkaline  phosphatase 76, AST 17, ALT 12, albumin 3.3, total protein 6.2,  phosphorus 3.1, magnesium 1.9.  Cardiac  enzymes x2 sets have been  negative.  TSH 0.130.  UA is negative.  Blood cultures have been no  growth to date, and urine cultures have been no growth.   Neurovascular studies done on March 23, 2009, impression, no significant  extracranial carotid artery stenosis demonstrated, mild mixed plaque  noted throughout bilaterally.  Vertebrals are patent with antegrade  flow.  No other specific details can be found on the table as dictated.   DISPOSITION:  The patient at present is medically stable to be  discharged.  The patient is recommended to have a primary care physician  and to follow up with the primary care physician in the next 2 weeks.  The patient has been informed regarding her mood medications to be  avoided, possible altered mental status.   TOTAL TIME FOR DISCHARGE:  40 minutes, counseling of 20 minutes.      Monte Fantasia, MD  Electronically Signed     MP/MEDQ  D:  03/24/2009  T:  03/25/2009  Job:  [id number]

## 2011-02-28 NOTE — Op Note (Signed)
Emily Leonard, Emily Leonard NO.:  [id number]   MEDICAL RECORD NO.:  [id number]          PATIENT TYPE:  INP   LOCATION:  3019                         FACILITY:  [facility]   PHYSICIAN:  Cristi Loron, M.D.DATE OF BIRTH:  Apr 06, 1946   DATE OF PROCEDURE:  DATE OF DISCHARGE:                               OPERATIVE REPORT   BRIEF HISTORY:  The patient is a 65 year old white female who has  undergone 2 previous lumbar fusions.  She has developed intractable  back, hip, and leg pain.  She underwent both lumbar MRI and myelo-CT,  which demonstrated the patient had degenerative changes and stenosis at  L2-L3.  I discussed the various treatment options with the patient and  her family.  The patient is aware of the risks, benefits, and  alternatives of surgery, so I will proceed with an L2-L3 decompression,  instrumentation, and fusion, as well as exploration of her lumbar  fusion.   PREOPERATIVE DIAGNOSES:  1. L2-L3 degeneration stenosis.  2. Lumbar radiculopathy/myelopathy.  3. Lumbago.  4. Possible pseudoarthrosis at L3-L4.   POSTOPERATIVE DIAGNOSES:  1. L2-L3 degeneration.  2. Spondylosis.  3. Stenosis.  4. Lumbar radiculopathy/myelopathy.  5. 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   PROCEDURE:  Exploration of lumbar fusion; bilateral L2 laminotomies and  foraminotomies to decompress the bilateral L2 and L3 nerve roots; L2-L3  posterior lumbar interbody fusion with local autograft bone and  Actifuse/Vitoss bone graft extender; insertion of L2-L3 interbody  prosthesis (Capstone PEEK interbody prosthesis); L2-L5 posterior  segmental instrumentation with Legacy and CD Horizon titanium pedicle  screws and rods; and L2-L3 and L3-L4 posterolateral arthrodesis with  local morselized autograft bone, Vitoss and Actifuse bone graft  extenders.  The fusions were augmented with bone morphogenic protein-  soaked collagen sponges.   SURGEON:  Cristi Loron, M.D.   ASSISTANT:  Coletta Memos, M.D.   ANESTHESIA:  General endotracheal.   ESTIMATED BLOOD LOSS:  250 mL.   SPECIMENS:  None.   DRAINS:  None.   COMPLICATIONS:  None.   DESCRIPTION OF PROCEDURE:  The patient was brought to the operating room  by anesthesia team.  General endotracheal anesthesia was induced.  The  patient was turned to the prone position on the Wilson frame.  The  lumbosacral region was then prepared with Betadine scrub and Betadine  solution.  Sterile drapes were applied, and then injected the area to be  incised with Marcaine with epinephrine solution.  We used a scalpel to  make a linear midline incision through the patient's previous surgical  scar and extended it in the cephalad direction.  We used electrocautery  to perform a bilateral subperiosteal dissection exposing spinous process  lamina of L2, L3, and L4, as well as the facets at these levels in the  instrumentation from L3-L5.  We used a cerebellar retractor for  exposure.  We began exploration of fusion by removing the caps from the  old CD Horizon, as well as Legacy screws.  We then removed the rod.  We  noted that the screws at L3 were somewhat loose, and we  could still move  independently the L3-L4 lamina indicative of a pseudoarthrosis or  incomplete fusion.   We now turned attention to the L2-L3 decompression.  We used a high-  speed drill to perform bilateral L2 laminotomies and removed the  inferior facet at L2 bilaterally with Kerrison punch.  We then performed  foraminotomies about the bilateral L2-L3 nerve roots.  Of note, the  extent of decompression at this level was greater than required to do  the posterior lumbar interbody fusion because of the patient's stenosis  and facet arthropathy.   Having completed the decompression at L2-L3, we now turned attention to  arthrodesis.  We incised the L2-L3 intervertebral disks bilaterally with  a 15-blade scalpel and performed a partial intervertebral diskectomy   using pituitary forceps.  We prepared the vertebral end plates for the  fusion by removing the soft tissues using the curettes.  We then used  trial spacers and determined to use a 10 x 22 mm Capstone PEEK interbody  prosthesis.  We prefilled these prosthesis with local autograft bone,  Actifuse and Vitoss bone graft extender.  We then inserted the  prosthesis into the disk space of course after retracting the neural  structures out of harm's way, and I should mention we filled medially,  laterally, and ventrally the disk space with local autograft bone Vitoss  and Actifuse.  This completed the posterior lumbar interbody fusion.   We now turned attention to instrumentation.  We, under fluoroscopic  guidance, cannulated bilateral L2 pedicles with a bone probe.  We tapped  the pedicles with a 5.5 mm tap and then probed inside the tapped  pedicles around cortical breeches.  We then inserted 7.5 x 45 mm pedicle  screws bilaterally at L2 under fluoroscopic guidance.  We palpated along  the medial aspect of the L2 pedicles and noticed no cortical breeches.   Because the L3 screws seemed a little loose, we removed the L3 pedicles  and replaced them with 7.5 x 45 mm pedicle screws.  We then cut 2 rods  and contoured into appropriate lordosis, and then connected the  unilateral pedicle screw with the rod.  We passed a rod in place with  capsule, which we tightened appropriately, and then placed across the  connector between the rods completing the instrumentation.   We now turned attention to the posterolateral arthrodesis.  Because of  the pseudoarthrosis at L3-L4, we used a Leksell to remove some soft  tissue and used a high-speed drill to further decorticate the L3-L4  facet pars and transverse process at L3-L4, as well as remainder of the  posterior bony structures at L2-L3, and then we laid a bone morphogenic  protein-soaked collagen sponges over these decorticated posterolateral   structures, as well as local autograft bone, Vitoss and Actifuse.  These  completed the posterolateral arthrodesis at L2-L3 and L3-L4.  We then  inspected the thecal sac in the L2 and L3 nerve roots, noted that they  were well decompressed.  We then obtained hemostasis using bipolar  cautery.  We removed the retractor, and then reapproximated the  patient's thoracolumbar fascia with interrupted #1 Vicryl suture, the  subcutaneous tissue with a interrupted 2-0 Vicryl suture, and skin with  Steri-Strips and benzoin.  The wound was then coated with bacitracin  ointment.  A sterile dressing was applied.  The  drapes were removed and the patient was subsequently returned to supine  position where she was extubated by the anesthesia team and  transported  to the postanesthesia care unit in stable condition.  All sponge,  instrument, and needle counts were correct at the end of the case.      Cristi Loron, M.D.  Electronically Signed     JDJ/MEDQ  D:  04/16/2008  T:  04/17/2008  Job:  [id number]

## 2011-02-28 NOTE — H&P (Signed)
[name], Leonard NO.:  [id number]   MEDICAL RECORD NO.:  [id number]          PATIENT TYPE:  INP   LOCATION:  3020                         FACILITY:  [facility]   PHYSICIAN:  Manus Gunning, MD      DATE OF BIRTH:  [birth year]   DATE OF ADMISSION:  03/22/2009  DATE OF DISCHARGE:                              HISTORY & PHYSICAL   CHIEF COMPLAINT:  Altered mental status, slurred speech.   HISTORY OF PRESENT ILLNESS:  [name] is a pleasant 65 year old  Caucasian female who was transferred from [facility].  She  presented to that facility secondary to altered mental status, slurred  speech and confusion.  Apparently this morning her family claims that  the patient was doing well when she suddenly became more confused.  The  patient claims that she was having horrible dreams where people were  trying to give her more morphine and she was confused about where she  was.  The family claims that she was almost delirious and they brought  her to the emergency department.  In the emergency department, CT scan  of the head was performed and apparently did not demonstrate any acute  findings.  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  The patient herself does complain of a headache.  She denies blurring of vision.  Denies tinnitus, no loss of  consciousness.  No syncope, no presyncope.  No head injury, no  odynophagia, no dysphagia.  No neck fullness, no chest pain,  palpitations, PND, orthopnea.  No shortness of breath, cough,  expectoration.  No rhinorrhea.  No sick contacts.  No recent travel  history.  No ingestion of unusual foods.  No abdominal pain.  No nausea,  vomiting, diarrhea, constipation.  No dysuria, polyuria, hematuria.  No  bright red blood per rectum or melenic stool.   Complains of chronic  lower back pain and cervical neck pain.  Has had multiple spinal  fusions, cervical spine fusions as well.  Recent appendectomy in March  2010.   PAST MEDICAL/SURGICAL HISTORY:  1. Osteoporosis.  2. Restless leg syndrome.  3. Status post spinal fusion of lower back and cervical spine.  4. Hypertension.  5. Dyslipidemia.  6. History of appendectomy.   ALLERGIES:  ERYTHROMYCIN.   FAMILY HISTORY:  Mother had breast cancer, CVA and phlebitis.  Father  had emphysema and a CVA post surgery.   SOCIAL HISTORY:  Smokes one pack per day for 40 years.  No history of  alcohol or illicit drug use.  Lives at home with her husband.  Family  lives close by and are all present during interview.   HOME MEDICATIONS:  Home medications are as follows:.  1. Ropinirole 2 mg at bedtime.  2. Orphenadrine 100 mg twice daily.  3. Zolpidem 10 mg as directed as needed.  4. Nabumetone 500 mg twice daily.  5. Gabapentin 300 mg at bedtime.  6. Simvastatin 20 mg at bedtime.  7. Calcium 600 mg twice daily.  8. Cymbalta 60 mg once daily.  9. Diazepam 5 mg as directed as needed.  10.Synthroid 125 mcg once daily.  11.Nexium 40 mg once daily.  12.Estradiol 1 mg daily.  13.Fish oil 1000 mg daily.  14.Aspirin 81 mg daily.  15.Actonel 35 mg on Saturdays.  16.Morphine 12.5 mg twice daily.   REVIEW OF SYSTEMS:  A 14-point review of systems performed.  Pertinent  positives and negatives as described above.   PHYSICAL EXAMINATION:  VITALS:  At the time of my interview are  temperature 97.6, heart rate 59, respiratory 16, blood pressure 126/74,  oxygen saturation  97% on room air.  GENERAL:  Well-nourished, well-developed Caucasian female lying in bed  comfortably in no apparent distress.  HEENT:  Normocephalic, atraumatic.  Moist oral mucosa.  No thrush, erythema, postnasal drip.  Eyes  anicteric.  Extraocular muscles are intact.  Pupils are equal, react to  light and accommodation.  Neck:  CARDIOVASCULAR:  S1 and S2 normal,  regular rate and rhythm.  No murmur, rub, or gallops.  LUNGS:  Good air entry bilaterally.  No rhonchi, rales or wheezes  appreciated.   ABDOMEN:  Soft, nontender, nondistended.  Positive bowel sounds.  No  organomegaly.  EXTREMITIES:  No cyanosis, clubbing or edema.  Positive bilateral  dorsalis pedis.  CNS: Alert and oriented x3.  Cranial nerves II-XII grossly intact.  Power, sensation, reflexes bilaterally symmetrical.  SKIN:  No breakdown, swelling, ulcerations or masses.  HEM/ONC:  No palpable lymphadenopathy, ecchymosis, bruising or  petechiae.  NECK:  Neck is supple.  Good range of motion.  No thyromegaly.  No  carotid bruits.  Neck veins appear to be within normal limits.   LABORATORY:  Tests from [facility] are as follows:  Urinalysis  demonstrates occasional mucus, occasional epithelial cells, 1+ bacteria,  positive nitrite, negative leukocytes and negative bilirubin, ketones,  glucose and protein.  Sodium 140, potassium 4.2, chloride 106, CO2 26,  glucose 91, BUN 16, creatinine 0.80, anion gap 12, calcium 7.6,  thallium 4.5, CPK 7.4, troponin 0.01, myoglobin 37.7, T bili 0.3, AST  25, ALT 25, was 116, osmolality 270.  Chest x-ray demonstrates no acute  disease.  EKG reviewed by self demonstrates normal sinus rhythm.  No  signs of ischemia or infarction.  White blood cell count 6300,  hemoglobin 14.5, hematocrit 42.2, platelet count 261,000, polymorphs 43,  INR 1, PT 10, APTT 27.1.  The CT scan of the head demonstrates low  density in the region of the anterior horn, right internal capsule. An  acute stroke cannot be excluded.  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 No hemorrhage or mass effect.   ASSESSMENT AND PLAN:  1. Altered mental status with slurred speech, currently improved      significantly.  The family feels close to baseline.  Hold all mood      altering medications at this time.   Continue morphine for pain and      check MRI of the head.  Check ultrasound of bilateral carotids.      Check 2-D echocardiogram.  Start aspirin 325 mg p.o. daily.  Obtain      PT/OT consult.  This is all done essentially to prove out a      cerebrovascular accident.  2. History of hypertension.  Continue home medications at this time.  3. History of restless leg syndrome and chronic back pain secondary to      degenerative disk disease, status post spinal fusion.  At this time      we will hold all mood altering medications.  Continue morphine at      this time and provide supportive care.  Once workup has been      completed and the patient has not been altered greater than 24      hours, we can gradually reinstitute medications and monitor to      ensure that the medications are not causing the patient's      confusion.  At this time provide supportive care.  4. Deep venous thrombosis and GI prophylaxis.  Sequential compression      devices and Protonix and continue the patient Nexium at home.      Manus Gunning, MD     SP/MEDQ  D:  03/22/2009  T:  03/23/2009  Job:  [id number]

## 2011-03-02 ENCOUNTER — Encounter: Payer: PRIVATE HEALTH INSURANCE | Attending: Physical Medicine & Rehabilitation

## 2011-03-02 ENCOUNTER — Ambulatory Visit (HOSPITAL_BASED_OUTPATIENT_CLINIC_OR_DEPARTMENT_OTHER): Payer: PRIVATE HEALTH INSURANCE | Admitting: Physical Medicine & Rehabilitation

## 2011-03-02 DIAGNOSIS — M545 Low back pain, unspecified: Secondary | ICD-10-CM | POA: Insufficient documentation

## 2011-03-02 DIAGNOSIS — M533 Sacrococcygeal disorders, not elsewhere classified: Secondary | ICD-10-CM

## 2011-03-02 DIAGNOSIS — F17200 Nicotine dependence, unspecified, uncomplicated: Secondary | ICD-10-CM | POA: Insufficient documentation

## 2011-03-02 DIAGNOSIS — M961 Postlaminectomy syndrome, not elsewhere classified: Secondary | ICD-10-CM | POA: Insufficient documentation

## 2011-03-02 DIAGNOSIS — M25559 Pain in unspecified hip: Secondary | ICD-10-CM | POA: Insufficient documentation

## 2011-03-02 DIAGNOSIS — M76899 Other specified enthesopathies of unspecified lower limb, excluding foot: Secondary | ICD-10-CM | POA: Insufficient documentation

## 2011-03-02 NOTE — Procedures (Signed)
NAMERILY, NICKEY NO.:  [id number]  MEDICAL RECORD NO.:  [id number]           PATIENT TYPE:  O  LOCATION:  TPC                          FACILITY:  [facility]  PHYSICIAN:  Erick Colace, M.D.DATE OF BIRTH:  12-Jan-1946  DATE OF PROCEDURE:  03/02/2011 DATE OF DISCHARGE:                              OPERATIVE REPORT  REASON FOR VISIT:  Left sacroiliac injection under fluoroscopic guidance.  INDICATION:  Sacroiliac distribution pain, buttocks and hip pain as well as back pain status post fusion.  Pain is only partially response to medication management, rated 6/10, interferes with activities such as housework.  Informed consent was obtained after describing risks and benefits of the procedure with the patient.  These include bleeding, bruising, and infection.  She elects to proceed and has given written consent.  The patient placed prone on fluoroscopy table.  Betadine prep, sterile drape, a 25-gauge inch and half needle was used to anesthetize skin and subcu tissue 1% lidocaine x2 mL.  Then a 25-gauge 3-inch spinal needle was inserted in left SI joint under AP lateral oblique imaging. Omnipaque 180 under live fluoro demonstrated no intravascular uptake followed by injection of 1 mL of 2% lidocaine and 1.5 mL of 40 mg/mL Depo-Medrol.  The patient tolerated procedure well.  Pre and post injection vitals stable.  Post injection instructions given.  Return in 1 month followup visit.     Erick Colace, M.D. Electronically Signed    AEK/MEDQ  D:  03/02/2011 11:36:56  T:  03/02/2011 22:17:01  Job:  [id number]

## 2011-03-03 NOTE — Op Note (Signed)
Emily Leonard, Emily Leonard NO.:  [id number]   MEDICAL RECORD NO.:  [id number]          PATIENT TYPE:  INP   LOCATION:  3006                         FACILITY:  [facility]   PHYSICIAN:  Cristi Loron, M.D.DATE OF BIRTH:  [birth year]   DATE OF PROCEDURE:  01/08/2006  DATE OF DISCHARGE:                                 OPERATIVE REPORT   PREOPERATIVE DIAGNOSIS:  L3-4 herniated nucleus pulposus, degenerative disk  disease, stenosis, facet arthropathy, lumbago and lumbar radiculopathy.   POSTOPERATIVE DIAGNOSIS:  L3-4 herniated nucleus pulposus, degenerative disk  disease, stenosis, facet arthropathy, lumbago and lumbar radiculopathy.   OPERATION PERFORMED:  Bilateral L3 laminotomies/diskectomy; L3-4 posterior  lumbar interbody fusion; insertion of bilateral L3-4 interbody prosthesis  (Capstone PEEK cages); posterior segmental instrumentation, L3 to 5 using  Legacy titanium pedicle screws and rods; posterolateral arthrodesis L3-4  utilizing local morcellized autograft bone and  Vitoss bone graft extender.   SURGEON:  Cristi Loron, M.D.   ASSISTANT:  Danae Orleans. Venetia Maxon, M.D.   ANESTHESIA:  General endotracheal.   ESTIMATED BLOOD LOSS:  150 mL.   SPECIMENS:  None.   DRAINS:  None.   COMPLICATIONS:  None.   INDICATIONS FOR PROCEDURE:  The patient is a 65 year old white female who I  performed an L4-5 fusion on several years ago.  She initially did well. More  recently she developed severe back and left leg pain.  She failed medical  management and was worked up with a lumbar MRI which demonstrated a  herniated disk at L3-4 on the left with significant degenerative disease and  spinal stenosis/facet arthropathy.  I discussed the various treatment  options with the patient including surgery.  The patient weighed the risks,  benefits and alternatives to surgery and decided to proceed with L3-4  decompression and fusion.   DESCRIPTION OF PROCEDURE:  The patient was  brought to the operating room by  the anesthesia team.  General endotracheal anesthesia was induced.  The  patient was then turned to the prone position on the Wilson frame.  Her  lumbosacral region was then prepared with Betadine scrub and Betadine  solution and sterile drapes were applied.  I then injected the area to be  incised with Marcaine with epinephrine solution.  I used a scalpel to make a  midline incision through the patient's previous surgical scar.  I used  electrocautery to perform a bilateral subperiosteal dissection exposing the  bilateral spinous process of L2, 3, upper sacrum as well as exposing the  previous instrumentation.  We then obtained intraoperative radiograph to  confirm our location.  I then inserted a cerebellar retractor for exposure.  We began the procedure by using a high speed drill to perform bilateral L3  laminotomies.  I widened the laminotomy with the Kerrison punch and I then  removed the <L3-4 ligamentum flavum.  I then performed a foraminotomy about  the bilateral L3 and L4 nerve roots.  We then freed up the thecal sac and  the nerve roots from the epidural tissue and then inspected the  intervertebral disk. There was a  large disk herniation at L3-4 on the left.  I removed it in multiple fragments using pituitary forceps.  The disk  herniation had migrated caudally and were compressing the nerve root just as  it entered the neural foramen.  We then sized the L3-4 intervertebral disk  and performed a bilateral diskectomy using pituitary forceps and Epstein and  Scoville curets.  We cleared all soft tissue from the vertebral end plates  using the curettes in preparation for posterior lumbar interbody fusion.  We  then prefilled 10 x 26 Capstone PEEK cages with local autograft bone and  Vitoss.  We then inserted these interbody prostheses into the L3-4  interspace of course after retracting the neural structures out of harm's  way.  We filled in  between and lateral to the cages with Vitoss and  autograft bone completing the posterior lumbar interbody fusion.  We now  turned our attention to the instrumentation.  We removed the caps off all  screws at L4 and 5, removed the rod.  I then under fluoroscopic guidance  cannulated the bilateral 3 pedicles.  I tapped the pedicles with a 5.5 mm  tap.  Probed inside the tapped pedicles to rule out cortical breeches.  I  then inserted 6.5 x 50 mm screws bilaterally at L3 under fluoroscopic  guidance.  I then palpated along the medial aspect of the L3 pedicle with a  nerve hook and noted there was no cortical breeches.  We then placed a rod  from L3 to L5 connecting the unilateral pedicles.  We put the caps back on.  The new screws were Legacy screws and the old screws were CD Horizon M10  screws.  I fastened the rod to the screws by tightening each cap.  This  completed the instrumentation.   We now turned our attention to posterolateral arthrodesis.  We used a high  speed drill to decorticate the bilateral L3-4 facets, pars region.  We then  laid a combination of local morcellized autograft bone and Vitoss over the  decorticated posterolateral structures completing the posterolateral  arthrodesis.   I then inspected the thecal sac and along the exit route of the L3 and L4  nerve roots and noted neural structures were well decompressed.  We obtained  hemostasis using bipolar electrocautery.  We irrigated the wound out with  bacitracin solution.  I then removed the cerebellar retractors and then  reapproximated the patient's thoracolumbar fascia with interrupted #1 Vicryl  sutures, subcutaneous tissue with interrupted 2-0 Vicryl suture and the skin  with Steri-Strips and benzoin. The wound was then coated with bacitracin  ointment, sterile dressing was applied, the drapes were removed.  The patient was subsequently returned to supine position where she was extubated  by the anesthesia team  and transported to the post anesthesia care unit in  stable condition.  All sponge, needle and instrument counts were correct at  the end of the case.      Cristi Loron, M.D.  Electronically Signed     JDJ/MEDQ  D:  01/08/2006  T:  01/09/2006  Job:  [id number]

## 2011-03-03 NOTE — Discharge Summary (Signed)
Emily Leonard, HUSBY NO.:  [id number]   MEDICAL RECORD NO.:  [id number]          PATIENT TYPE:  INP   LOCATION:  3019                         FACILITY:  [facility]   PHYSICIAN:  Cristi Loron, M.D.DATE OF BIRTH:  [birth year]   DATE OF ADMISSION:  04/16/2008  DATE OF DISCHARGE:  04/20/2008                               DISCHARGE SUMMARY   BRIEF HISTORY:  The patient is a 65 year old white female who has  undergone 2 previous lumbar fusions.  She has developed intractable  back, hip, and leg pain.  She underwent both lumbar MRI and lumbar CT,  which demonstrated the patient has degenerative changes and  stenosis at  L2-L3.  I discussed the various treatment options with the patient and  her family.  The patient weighed the risks, benefits, and alternatives  of surgery, and decided to proceed with an L2-L3 decompression and  fusion as well as exploration of her prior arthrodesis.   For further details of this admission, please refer to typed history and  physical.   HOSPITAL COURSE:  Admitted the patient to [facility] on April 16, 2008.  On the day of admission, I performed an exploration of her L3-L4  fusion as well as L2-L3 decompression, instrumentation, and fusion. The  surgery went well (for full details of this operation, please refer to  typed operative note).   POSTOPERATIVE COURSE:  The patient's postoperative course was  unremarkable and by postop day #4, she was afebrile. Vital signs were  stable.  She was eating well and ambulating well and requesting  discharge to home.  She was therefore discharged to home on April 20, 2008.   DISCHARGE PRESCRIPTIONS:  The patient was given prescription for:  1. Percocet 10/325 #100 one p.o. q.4 h. p.r.n. pain.  2. Valium 5 mg #15 one p.o. q.6 h. p.r.n. for muscle spasm.   DISCHARGE INSTRUCTIONS:  The patient was instructed to follow up with me  in 4 weeks and given written discharge instructions.   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   PROCEDURE PERFORMED:  Exploration of lumbar fusion, 2 laminectomies,  foraminotomies due to decompressive bilateral L2 and L3 nerve roots; L2-  L3 posterior lumbar interbody fusion with local autograft bone and  Actifuse/ Vitoss bone graft extender; insertion of L2-L3 interbody  prosthesis (Capstone PEEK interbody prosthesis); L2-L5 posterior  segmental instrumentation with Legacy and CD Horizon titanium pedicle  screws and rods; L2-L3 and L3-L4 posterolateral arthrodesis with local  morselized autograft bone, Vitoss and Actifuse bone graft extenders; the  fusions were augmented with bone morphogenic protein-soaked collagen  sponges.     Cristi Loron, M.D.  Electronically Signed    JDJ/MEDQ  D:  05/14/2008  T:  05/15/2008  Job:  [id number]

## 2011-03-03 NOTE — Op Note (Signed)
NAME:  Emily Leonard, Emily Leonard                           ACCOUNT NO.:  [id number]   MEDICAL RECORD NO.:  [id number]                   PATIENT TYPE:  INP   LOCATION:  3009                                 FACILITY:  [facility]   PHYSICIAN:  Cristi Loron, M.D.            DATE OF BIRTH:  1945-10-22   DATE OF PROCEDURE:  07/24/2002  DATE OF DISCHARGE:                                 OPERATIVE REPORT   PREOPERATIVE DIAGNOSIS:  L4-5 degenerative disc disease, spondylosis,  stenosis, lumbago and lumbar radiculopathy.   POSTOPERATIVE DIAGNOSIS:  L4-5 degenerative disc disease, spondylosis,  stenosis, lumbago and lumbar radiculopathy.   OPERATION:  Redo L4 laminectomy; L4-5 posterior lumbar interbody fusion;  insertion of bilateral tangent bone dowels (8 x 26 mm); posterior  instrumentation at L4-5 (CD Horizon M10 pedicle screws); posterior lateral  arthrodesis with local morselized Allograft bone and bone scaffolding.   SURGEON:  Cristi Loron, M.D.   ASSISTANT:  Clydene Fake, M.D.   ANESTHESIA:  General endotracheal   ESTIMATED BLOOD LOSS:  350 cc   SPECIMENS:  None   DRAINS:  None   COMPLICATIONS:  None   BRIEF HISTORY:  The patient is a 65 year old white female whom I performed  an L4-5 laminectomy and diskectomy several years ago.  She did well for many  years but has complained of back and leg pain.  She was worked up with a  lumbar MRI which demonstrated severe degenerative disease at L4-5 with motor  type changes and spondylosis and stenosis.  I discussed the procedure with  her.  She weighted the risks, benefits and alteratives of surgery and  decided to proceed with a lumbar fusion.   DESCRIPTION OF PROCEDURE:  The patient was brought to the operating room by  anesthesia team.  General endotracheal anesthesia was induced.  The patient  was then turned to the prone position on the chest rolls.  Her lumbosacral  region was prepared with Betadine scrub and Betadine  solution and sterile  drapes were applied.  I then injected the area to be incised with Marcaine  with epinephrine solution.  I used the scalpel to ellipse out the patient's  out prior surgical scar and extend the incision above this cephalad and  caudal direction.  I used electrocautery to dissect down to the  thoracolumbar fascia and then divided the fascia bilaterally performing a  bilateral subperiosteal dissection.  Stripped the paraspinous musculature  from the spinous process and lamina of L4 and L5 bilaterally.  I inserted  the McCall retractor for exposure.   We obtained intraoperative radiograph to confirm our location.  We began on  the left side and used the high speed drill to perform a left L4 laminotomy.  We widened the laminectomy with the Kerrison punch with both the L4-5  ligamentum flavum.  We then incised the interspinous ligament at L4-5 and L3-  4 and then  used the Leksell rongeur to remove the spinous processes of L4  and part of the L4 lamina.  This bone was then cleaned of soft tissue and  later used in the fusion process.  The L4 laminectomy was completed with the  Kerrison punch.  There was considerable scar tissue on the right side and I  used the Jefferson #4 to dissect the epidural fibrosis from the dura and  performed a complete laminectomy at L4 and remove the ligamentum flavum  bilaterally at L4-5 and L3-4 and got good decompression of the thecal sac.  Performed a foraminotomy about the bilateral L4 and L5 nerve roots.  The  thecal sac and L5 nerve roots were mobilized and then the bilateral L4-5  aggressive dissection was performed using the #15 scalpel, the Epstein  Scoville curets and epidural pituitary forceps.   We now turned our attention to the posterior lumbar interbody fusion.  The  L4-5 vertebral end plates were then prepared by using the curved curets.  The interspace was displaced and an 8 x 26 mm bone dowel was inserted into  the interspace  bilaterally of course after retracting the neural structures  out of harms way.  In between and lateral to the bone grafts was packed with  a combination of local morselized Allograft bone completing the posterior  lumbar interbody fusion.   We now turned our attention to the posterior instrumentation.  The  electrocautery was used to expose the bilateral transverse processes at L4  and L5 and then the high speed drill was used to decorticate posterior to  the bilateral L4 and L5 pedicles.  The bilateral L4 and L5 pedicles were  then cannulated with ball probes under fluoroscopic guidance.  They were  then tapped with a 5.5 mm tap.  The interior of the tapped pedicles was  probed with a ball probe and it was noted that there was no cortical  breeches.  Then a 6.5 x 45 mm pedicle screws were placed bilaterally in the  L4 and L5 pedicles under fluoroscopic guidance.  The medial edge of the  pedicle was then palpated from within the spinal canal and noted that there  were no cortical breeches and the bilateral L4 and L5 nerve roots were not  injured.  The unilateral pedicle screws were then connected using the  appropriate length contoured rod and then secured with the caps which were  tightened down appropriately.   We now turned our attention to the posterior lateral edges, the remainder of  the L4-5 facet, as well as the L5 lamina and bilateral L4 and L5 transverse  processes were cleared of soft tissue using the high speed drill,  decorticated and then a combination of local morselized Allograft and V-Toss  bone scrapping were laid over the posterior lateral structures completing  the posterior arthrodesis.   We then inspected the thecal sac at about L4 and L5 nerve roots and are  noted to be well decompressed.  Then the wound was copiously irrigated with  Bacitracin solution.  Solution was removed and then [facility] retractor was removed.  The thoracolumbar fascia was reapproximated with  interrupted #1  Vicryl suture, subcutaneous tissue with 2-0 Vicryl suture and the skin with  Steri-Strips and Benzoin.  The wound was then covered with Bacitracin  ointment and sterile dressing was applied.  The drapes were removed. The  patient was subsequently extubated by the anesthesia team and transported to  the postanesthesia care unit in stable condition.  All sponge, instrument  and needle counts were correct at the end of this case.                                                Cristi Loron, M.D.    JDJ/MEDQ  D:  07/24/2002  T:  07/25/2002  Job:  [id number]

## 2011-03-31 ENCOUNTER — Encounter: Payer: PRIVATE HEALTH INSURANCE | Attending: Physical Medicine & Rehabilitation

## 2011-03-31 ENCOUNTER — Ambulatory Visit (HOSPITAL_BASED_OUTPATIENT_CLINIC_OR_DEPARTMENT_OTHER): Payer: PRIVATE HEALTH INSURANCE | Admitting: Physical Medicine & Rehabilitation

## 2011-03-31 DIAGNOSIS — F17200 Nicotine dependence, unspecified, uncomplicated: Secondary | ICD-10-CM | POA: Insufficient documentation

## 2011-03-31 DIAGNOSIS — M545 Low back pain, unspecified: Secondary | ICD-10-CM | POA: Insufficient documentation

## 2011-03-31 DIAGNOSIS — M5137 Other intervertebral disc degeneration, lumbosacral region: Secondary | ICD-10-CM

## 2011-03-31 DIAGNOSIS — M76899 Other specified enthesopathies of unspecified lower limb, excluding foot: Secondary | ICD-10-CM

## 2011-03-31 DIAGNOSIS — M25559 Pain in unspecified hip: Secondary | ICD-10-CM | POA: Insufficient documentation

## 2011-03-31 DIAGNOSIS — M533 Sacrococcygeal disorders, not elsewhere classified: Secondary | ICD-10-CM | POA: Insufficient documentation

## 2011-03-31 DIAGNOSIS — M51379 Other intervertebral disc degeneration, lumbosacral region without mention of lumbar back pain or lower extremity pain: Secondary | ICD-10-CM

## 2011-03-31 DIAGNOSIS — M961 Postlaminectomy syndrome, not elsewhere classified: Secondary | ICD-10-CM | POA: Insufficient documentation

## 2011-04-03 NOTE — Procedures (Signed)
Emily Leonard, Emily Leonard NO.:  [id number]  MEDICAL RECORD NO.:  [id number]           PATIENT TYPE:  LOCATION:                                 FACILITY:  PHYSICIAN:  Erick Colace, M.D.DATE OF BIRTH:  1945-12-07  DATE OF PROCEDURE: DATE OF DISCHARGE:                              OPERATIVE REPORT  PROCEDURE:  Left trochanteric bursa injection area.  INDICATION:  Trochanteric bursitis, pain is only partially response to medication management, other conservative care interfering with activities.  Informed consent was obtained after describing risks and benefits of the procedure with patient.  These include bleeding, bruising, and infection.  She elects to proceed and has given written consent.  The patient placed in right lateral decubitus position area, marked and prepped over the left greater trochanter, entered with 25-gauge inch and half needle.  0.5 mL of 40 mg/mL of Depo-Medrol plus 4 mL of 1% lidocaine were injected.  The patient tolerated procedure well. Postprocedure instructions given.     Erick Colace, M.D. Electronically Signed    AEK/MEDQ  D:  03/31/2011 11:36:50  T:  04/01/2011 00:13:19  Job:  [id number]

## 2011-04-03 NOTE — Assessment & Plan Note (Signed)
A 65 year old female who has a long history of low back pain.  She has had lumbar laminectomy and fusion basically fused L2 through L5.  She has had prior L5-S1 transforaminal injection on February 07, 2010 and March 25, 2010 and then L1-2 transforaminal on November 04, 2010.  She did have a L1-2 foraminal protrusion potentially affecting the L1 nerve root. Her last injection lasted for about a month and half.  Trialed her on SI injection was not helpful.  SI belt seems to back graded some hip pain. She has had no new medical problems in the interval time.  Pain is about 5.5-6/10.  Twenty-minute walking tolerance.  She can climb steps.  She can drive.  She needs help with certain household duties, but otherwise independent.  PHYSICAL EXAMINATION:  VITAL SIGNS:  Blood pressure 153/92, pulse 65, respirations 18, and O2 saturation 98% on room air. GENERAL:  A well-developed, well-nourished female in no acute distress. Mood and affect appropriate. MUSCULOSKELETAL:  Her gait with slight limp favoring the left lower extremity.  She has tenderness over the left hip greater trochanter.  No pain with hip range of motion on the left side.  She has normal lower extremity strength.  Normal sensation.  Deep tendon reflexes are normal. Back has some tenderness over the SI joint area.  IMPRESSION: 1. Lumbar postlaminectomy syndrome, extensive fusion L2 through L5 as     well as left-sided L1-2 protrusion, may be causing some L1     radicular symptomatology. 2. Trochanteric bursitis.  This is certainly adding to her problems.  PLAN: 1. We will inject the hip today. 2. Set her up for L1-2 transforaminal injection. 3. We will trial her on Flexeril 5 mg b.i.d.     Erick Colace, M.D. Electronically Signed    AEK/MedQ D:  03/31/2011 11:35:09  T:  04/01/2011 00:15:14  Job #:  [id number]

## 2011-05-11 ENCOUNTER — Encounter: Payer: PRIVATE HEALTH INSURANCE | Attending: Physical Medicine & Rehabilitation

## 2011-05-11 ENCOUNTER — Encounter (HOSPITAL_BASED_OUTPATIENT_CLINIC_OR_DEPARTMENT_OTHER): Payer: PRIVATE HEALTH INSURANCE | Admitting: Physical Medicine & Rehabilitation

## 2011-05-11 DIAGNOSIS — M76899 Other specified enthesopathies of unspecified lower limb, excluding foot: Secondary | ICD-10-CM | POA: Insufficient documentation

## 2011-05-11 DIAGNOSIS — IMO0002 Reserved for concepts with insufficient information to code with codable children: Secondary | ICD-10-CM

## 2011-05-11 DIAGNOSIS — M533 Sacrococcygeal disorders, not elsewhere classified: Secondary | ICD-10-CM | POA: Insufficient documentation

## 2011-05-11 DIAGNOSIS — M545 Low back pain, unspecified: Secondary | ICD-10-CM | POA: Insufficient documentation

## 2011-05-11 DIAGNOSIS — F17200 Nicotine dependence, unspecified, uncomplicated: Secondary | ICD-10-CM | POA: Insufficient documentation

## 2011-05-11 DIAGNOSIS — M25559 Pain in unspecified hip: Secondary | ICD-10-CM | POA: Insufficient documentation

## 2011-05-11 DIAGNOSIS — M961 Postlaminectomy syndrome, not elsewhere classified: Secondary | ICD-10-CM | POA: Insufficient documentation

## 2011-05-15 NOTE — Procedures (Signed)
Emily Leonard, WILLIAMS NO.:  [id number]  MEDICAL RECORD NO.:  [id number]           PATIENT TYPE:  LOCATION:                                 FACILITY:  PHYSICIAN:  Erick Colace, M.D.DATE OF BIRTH:  Feb 25, 1946  DATE OF PROCEDURE: DATE OF DISCHARGE:                              OPERATIVE REPORT  PROCEDURE:  Caudal epidural steroid injection under fluoroscopic guidance.  INDICATION:  Pain rated as 6-7/10 back and buttock sometime going into the lower extremities even as far as the feet which interferes with activity.  Pain is only partially responsive to medication management and other conservative care.  She responded only partially to SI injection.  Informed consent was obtained after describing risks and benefits of the procedure with the patient.  These include bleeding, bruising and infection.  She elects to proceed and has given written consent.  The patient placed prone on fluoroscopy table.  Betadine prep, sterile drape 25-gauge inch and half needle was used to anesthetize skin and subcutaneous tissue 1% lidocaine x2 mL.  Then, a 22-gauge, 3-1/2-inch spinal needle was inserted into the sacral hiatus.  AP and lateral images utilized.  Once needle tip approximated L3 foramen on AP views 3 mL of Omnipaque 180 were injected.  Christmas tree pattern elicited followed by injection of 2 mL of 40 mg/mL Depo-Medrol and 2 mL of 1% MPF lidocaine.  The patient tolerated the procedure well.  Postprocedure instructions and monitoring performed.  We will return in 3 months for possible reinjection at that time.     Erick Colace, M.D. Electronically Signed    AEK/MEDQ  D:  05/11/2011 11:31:15  T:  05/11/2011 12:44:09  Job:  [id number]

## 2011-06-06 ENCOUNTER — Ambulatory Visit: Payer: PRIVATE HEALTH INSURANCE | Admitting: Cardiovascular Disease

## 2011-06-06 ENCOUNTER — Ambulatory Visit (INDEPENDENT_AMBULATORY_CARE_PROVIDER_SITE_OTHER): Payer: PRIVATE HEALTH INSURANCE | Admitting: Cardiovascular Disease

## 2011-06-06 ENCOUNTER — Encounter: Payer: Self-pay | Admitting: Cardiovascular Disease

## 2011-06-06 ENCOUNTER — Encounter: Payer: Self-pay | Admitting: *Deleted

## 2011-06-06 ENCOUNTER — Telehealth: Payer: Self-pay

## 2011-06-06 DIAGNOSIS — R079 Chest pain, unspecified: Secondary | ICD-10-CM | POA: Insufficient documentation

## 2011-06-06 DIAGNOSIS — R0600 Dyspnea, unspecified: Secondary | ICD-10-CM

## 2011-06-06 DIAGNOSIS — R0609 Other forms of dyspnea: Secondary | ICD-10-CM

## 2011-06-06 DIAGNOSIS — I517 Cardiomegaly: Secondary | ICD-10-CM

## 2011-06-06 NOTE — Patient Instructions (Addendum)
You are scheduled for a Lexiscan and Echo in [place]. Please refer to instructions given in office.  Your physician has recommended that you have a pulmonary function test. Pulmonary Function Tests are a group of tests that measure how well air moves in and out of your lungs. This will be done at [facility]. We will call you with your appt  Your physician recommends that you schedule a follow-up appointment in: 1-2 months IN [place]

## 2011-06-06 NOTE — Progress Notes (Signed)
Emily Leonard Date of Birth  02-May-1946 [facility] [address]     [address] [place], [place]  [address] [phone]  Fax  [phone]  History of Present Illness:  65 year old female with a history of hypertension. She reports having spells recently.  She's been having episodes of palpitations. These episodes are associated with a week or syncopal sensation.Marland Kitchen  He is also associated with some chest heaviness.  She's having frequent episodes of heartburn.  She has multiple vague complaints.  She walks a regular basis.  She's not had any episodes of weakness or chest pain with walking.  Current Outpatient Prescriptions  Medication Sig Dispense Refill  . amitriptyline (ELAVIL) 25 MG tablet Take 25 mg by mouth at bedtime.        Marland Kitchen aspirin 81 MG tablet Take 81 mg by mouth daily.        . calcium carbonate (OS-CAL) 600 MG TABS Take 600 mg by mouth 2 (two) times daily with a meal.        . esomeprazole (NEXIUM) 40 MG capsule Take 40 mg by mouth daily before breakfast.        . estradiol (ESTRACE) 1 MG tablet Take 1 mg by mouth daily.        Marland Kitchen FLUoxetine (PROZAC) 40 MG capsule Take 40 mg by mouth daily.        Marland Kitchen gabapentin (NEURONTIN) 600 MG tablet Take 600 mg by mouth 3 (three) times daily.        . hydrochlorothiazide 25 MG tablet Take 25 mg by mouth daily.        Marland Kitchen levothyroxine (SYNTHROID, LEVOTHROID) 112 MCG tablet Take 112 mcg by mouth daily.        . meloxicam (MOBIC) 15 MG tablet Take 15 mg by mouth daily.        . Omega-3 Fatty Acids (FISH OIL) 1000 MG CAPS Take 1,000 mg by mouth 1 day or 1 dose.        . oxyCODONE (OXYCONTIN) 15 MG TB12 Take 15 mg by mouth every 12 (twelve) hours.        . pravastatin (PRAVACHOL) 20 MG tablet Take 20 mg by mouth daily.        Marland Kitchen rOPINIRole (REQUIP) 2 MG tablet Take 2 mg by mouth at bedtime.        . temazepam (RESTORIL) 30 MG capsule Take 30 mg by mouth at bedtime as needed.        Marland Kitchen tiZANidine (ZANAFLEX) 4  MG capsule Take 4 mg by mouth 3 (three) times daily.           Allergies  Allergen Reactions  . Erythromycin     REACTION: Swelling  . Latex     REACTION: Blisters  . Morphine Sulfate     REACTION: Halucinations/deep sleep    Past Medical History  Diagnosis Date  . Palpitations   . Chronic pain syndrome   . Essential hypertension, benign   . Insomnia, unspecified   . Depressive disorder, not elsewhere classified   . Anxiety   . Restless leg syndrome   . Thyroid disease   . Osteoarthritis   . TIA (transient ischemic attack)   . Hyperlipidemia   . GERD (gastroesophageal reflux disease)   . Degenerative joint disease   . Degenerative disc disease     Past Surgical History  Procedure Date  . Abdominal hysterectomy   . Appendectomy   . Cataract extraction, bilateral   .  Lumbar spine surgery     x 3    History  Smoking status  . Current Everyday Smoker -- 1.0 packs/day for 30 years  . Types: Cigarettes  Smokeless tobacco  . Not on file    History  Alcohol Use  . 0.5 oz/week  . 1 drink(s) per week    Family History  Problem Relation Age of Onset  . Hypertension Mother   . Hypertension Father   . Heart attack Father     Reviw of Systems:  Reviewed in the HPI.  All other systems are negative.  Physical Exam: BP 146/89  Pulse 76  Ht 5\' 4"  (1.626 m)  Wt 147 lb (66.679 kg)  BMI 25.23 kg/m2 The patient is alert and oriented x 3.  The mood and affect are normal.   Skin: warm and dry.  Color is normal.    HEENT:   the sclera are nonicteric.  The mucous membranes are moist.  The carotids are 2+ without bruits.  There is no thyromegaly.  There is no JVD.    Lungs: clear.  The chest wall is non tender.    Heart: regular rate with a normal S1 and S2.  There are no murmurs, gallops, or rubs. The PMI is not displaced.     Abdomen: good bowel sounds.  There is no guarding or rebound.  There is no hepatosplenomegaly or tenderness.  There are no masses.    Extremities:  no clubbing, cyanosis, or edema.  The legs are without rashes.  The distal pulses are intact.   Neuro:  Cranial nerves II - XII are intact.  Motor and sensory functions are intact.    The gait is normal.  ECG: Normal sinus rhythm. She has pulmonary disease pattern. There is right ventricular hypertrophy.  Assessment / Plan:

## 2011-06-06 NOTE — Telephone Encounter (Signed)
Duplicate chart message.

## 2011-06-06 NOTE — Assessment & Plan Note (Signed)
She's having some episodes of chest discomfort. These are fairly atypical but she is a long-term cigarette smoker. She also has shortness of breath and some weakness and dizziness. We will get a Lexiscan Myoview study for further evaluation.  We will also get an echocardiogram. She has right ventricular hypertrophy on her EKG and certainly could have some COPD. I'll see her again in one to 2 months. She was to followup in our [place] office.

## 2011-06-06 NOTE — Assessment & Plan Note (Signed)
She has a long history of cigarette smoking. I suspect she may have some degree of COPD. She also has right ventricular hypertrophy on her EKG some worried about significant coronary scarring. She does not have any real symptoms of pulmonary embolus.  We'll get a full set of pulmonary function tests including a DLCO, lung volumes,  pre-and post bronchodilator.  I've asked her to stop smoking.

## 2011-06-09 ENCOUNTER — Ambulatory Visit (HOSPITAL_COMMUNITY)
Admission: RE | Admit: 2011-06-09 | Discharge: 2011-06-09 | Disposition: A | Discharge status: Home / Self Care | Payer: PRIVATE HEALTH INSURANCE | Source: Ambulatory Visit | Attending: Cardiovascular Disease | Admitting: Cardiovascular Disease

## 2011-06-09 DIAGNOSIS — R0609 Other forms of dyspnea: Secondary | ICD-10-CM | POA: Insufficient documentation

## 2011-06-09 DIAGNOSIS — R0600 Dyspnea, unspecified: Secondary | ICD-10-CM

## 2011-06-09 DIAGNOSIS — R0989 Other specified symptoms and signs involving the circulatory and respiratory systems: Secondary | ICD-10-CM | POA: Insufficient documentation

## 2011-06-09 LAB — PULMONARY FUNCTION TEST

## 2011-06-12 ENCOUNTER — Encounter: Payer: Self-pay | Admitting: *Deleted

## 2011-06-13 ENCOUNTER — Ambulatory Visit (HOSPITAL_BASED_OUTPATIENT_CLINIC_OR_DEPARTMENT_OTHER): Payer: Medicare Other | Admitting: Radiology

## 2011-06-13 ENCOUNTER — Ambulatory Visit (HOSPITAL_COMMUNITY): Payer: Medicare Other | Attending: Cardiovascular Disease | Admitting: Radiology

## 2011-06-13 DIAGNOSIS — R0609 Other forms of dyspnea: Secondary | ICD-10-CM | POA: Insufficient documentation

## 2011-06-13 DIAGNOSIS — I517 Cardiomegaly: Secondary | ICD-10-CM

## 2011-06-13 DIAGNOSIS — R0989 Other specified symptoms and signs involving the circulatory and respiratory systems: Secondary | ICD-10-CM | POA: Insufficient documentation

## 2011-06-13 DIAGNOSIS — R079 Chest pain, unspecified: Secondary | ICD-10-CM | POA: Insufficient documentation

## 2011-06-13 DIAGNOSIS — R072 Precordial pain: Secondary | ICD-10-CM

## 2011-06-13 DIAGNOSIS — R0789 Other chest pain: Secondary | ICD-10-CM

## 2011-06-13 DIAGNOSIS — R0600 Dyspnea, unspecified: Secondary | ICD-10-CM

## 2011-06-13 MED ORDER — TECHNETIUM TC 99M TETROFOSMIN IV KIT
33.0000 | PACK | Freq: Once | INTRAVENOUS | Status: AC | PRN
  Administered 2011-06-13: 33 via INTRAVENOUS

## 2011-06-13 MED ORDER — REGADENOSON 0.4 MG/5ML IV SOLN
0.4000 mg | Freq: Once | INTRAVENOUS | Status: AC
  Administered 2011-06-13: 0.4 mg via INTRAVENOUS

## 2011-06-13 MED ORDER — TECHNETIUM TC 99M TETROFOSMIN IV KIT
11.0000 | PACK | Freq: Once | INTRAVENOUS | Status: AC | PRN
  Administered 2011-06-13: 11 via INTRAVENOUS

## 2011-06-13 NOTE — Progress Notes (Signed)
[facility] SITE 3 NUCLEAR MED [address] [place] [address] [phone]  Cardiology Nuclear Med Study  Emily Leonard is a 65 y.o. female [id number] Feb 19, 1946   Nuclear Med Background Indication for Stress Test:  Evaluation for Ischemia History:  >10 yrs. ago  GXT:OK per patient; '10 Echo:Normal LVF Cardiac Risk Factors: CVA, Family History - CAD, Hypertension, Lipids, Smoker and TIA  Symptoms:  Chest Pain/Pressure.  (last episode of chest discomfort was about one week ago), Diaphoresis, Fatigue, Nausea and Rapid HR   Nuclear Pre-Procedure Caffeine/Decaff Intake:  None NPO After: 8:00pm   Lungs:  Clear.  O2 sat 96% on RA. IV 0.9% NS with Angio Cath:  20g  IV Site: R Antecubital  IV Started by:  Stanton Kidney, EMT-P  Chest Size (in):  36 Cup Size: B  Height: 5\' 4"  (1.626 m)  Weight:  148 lb (67.132 kg)  BMI:  Body mass index is 25.40 kg/(m^2). Tech Comments:  NA    Nuclear Med Study 1 or 2 day study: 1 day  Stress Test Type:  Lexiscan  Reading MD: Cassell Clement, MD  Order Authorizing Provider:  Kristeen Miss, MD  Resting Radionuclide: Technetium 14m Tetrofosmin  Resting Radionuclide Dose: 11.0 mCi   Stress Radionuclide:  Technetium 10m Tetrofosmin  Stress Radionuclide Dose: 33.0 mCi           Stress Protocol Rest HR: 61 Stress HR: 86  Rest BP: 137/96 Stress BP: 154/91  Exercise Time (min): n/a METS: n/a   Predicted Max HR: 154 bpm % Max HR: 55.84 bpm Rate Pressure Product: 21308   Dose of Adenosine (mg):  n/a Dose of Lexiscan: 0.4 mg  Dose of Atropine (mg): n/a Dose of Dobutamine: n/a mcg/kg/min (at max HR)  Stress Test Technologist: Smiley Houseman, CMA-N  Nuclear Technologist:  Doyne Keel, CNMT     Rest Procedure:  Myocardial perfusion imaging was performed at rest 45 minutes following the intravenous administration of Technetium 48m Tetrofosmin.  Rest ECG: No acute changes.  Stress Procedure:  The patient received IV Lexiscan 0.4 mg  over 15-seconds.  Technetium 59m Tetrofosmin injected at 30-seconds.  There were no significant changes with Lexiscan.  Quantitative spect images were obtained after a 45 minute delay.  Stress ECG: No significant change from baseline ECG  QPS Raw Data Images:  Normal; no motion artifact; normal heart/lung ratio. Stress Images:  Normal homogeneous uptake in all areas of the myocardium. Rest Images:  Normal homogeneous uptake in all areas of the myocardium. Subtraction (SDS):  No evidence of ischemia. Transient Ischemic Dilatation (Normal <1.22):  1.15 Lung/Heart Ratio (Normal <0.45):  0.26  Quantitative Gated Spect Images QGS EDV:  70 ml QGS ESV:  23 ml QGS cine images:  NL LV Function; NL Wall Motion QGS EF: 68%  Impression Exercise Capacity:  Lexiscan with no exercise. BP Response:  Normal blood pressure response. Clinical Symptoms:  No chest pain. ECG Impression:  No significant ST segment change suggestive of ischemia. Comparison with Prior Nuclear Study: No previous nuclear study performed  Overall Impression:  Normal stress nuclear study.    Cassell Clement

## 2011-06-14 ENCOUNTER — Telehealth: Payer: Self-pay | Admitting: Cardiovascular Disease

## 2011-06-14 ENCOUNTER — Telehealth: Payer: Self-pay | Admitting: Cardiology

## 2011-06-14 ENCOUNTER — Institutional Professional Consult (permissible substitution): Payer: PRIVATE HEALTH INSURANCE | Admitting: Cardiovascular Disease

## 2011-06-14 NOTE — Telephone Encounter (Signed)
Returned your phone call and when asked what number she was calling from today she gave me her listed home phone number. Please call back.

## 2011-06-14 NOTE — Telephone Encounter (Signed)
Called x2 number not in service that is listed, normal echo

## 2011-06-14 NOTE — Telephone Encounter (Signed)
Pt returning call from [facility].

## 2011-06-14 NOTE — Telephone Encounter (Signed)
Message copied by Karle Plumber on Wed Jun 14, 2011  9:05 AM ------      Message from: Vesta Mixer      Created: Tue Jun 13, 2011  5:44 PM       Normal stress test

## 2011-06-14 NOTE — Telephone Encounter (Signed)
Left message for pt to call back regarding stress test and echo results.

## 2011-06-16 ENCOUNTER — Telehealth: Payer: Self-pay | Admitting: *Deleted

## 2011-06-20 ENCOUNTER — Telehealth: Payer: Self-pay | Admitting: *Deleted

## 2011-06-20 NOTE — Telephone Encounter (Signed)
Normal  Results given for echo and lexiscan, Pt verbalized understanding. Pt is f/u with dr hochrein. Alfonso Ramus RN

## 2011-06-22 ENCOUNTER — Ambulatory Visit (HOSPITAL_BASED_OUTPATIENT_CLINIC_OR_DEPARTMENT_OTHER): Payer: Medicare Other | Admitting: Physical Medicine & Rehabilitation

## 2011-06-22 ENCOUNTER — Encounter: Payer: Medicare Other | Attending: Physical Medicine & Rehabilitation

## 2011-06-22 DIAGNOSIS — M545 Low back pain, unspecified: Secondary | ICD-10-CM | POA: Insufficient documentation

## 2011-06-22 DIAGNOSIS — IMO0002 Reserved for concepts with insufficient information to code with codable children: Secondary | ICD-10-CM

## 2011-06-22 DIAGNOSIS — M533 Sacrococcygeal disorders, not elsewhere classified: Secondary | ICD-10-CM | POA: Insufficient documentation

## 2011-06-22 DIAGNOSIS — F17200 Nicotine dependence, unspecified, uncomplicated: Secondary | ICD-10-CM | POA: Insufficient documentation

## 2011-06-22 DIAGNOSIS — M961 Postlaminectomy syndrome, not elsewhere classified: Secondary | ICD-10-CM

## 2011-06-22 DIAGNOSIS — M76899 Other specified enthesopathies of unspecified lower limb, excluding foot: Secondary | ICD-10-CM | POA: Insufficient documentation

## 2011-06-22 DIAGNOSIS — M25559 Pain in unspecified hip: Secondary | ICD-10-CM | POA: Insufficient documentation

## 2011-06-26 NOTE — Assessment & Plan Note (Signed)
REASON FOR VISIT:  Chronic low back pain and lower extremity pain.  HISTORY:  [name] returns today.  A 65 year old female with prior history of lumbar degenerative disk and radiculopathy.  She has undergone multiple level fusion and basically fuse now from L2-L5.  She had a 100% relief with sacroiliac injection but this is only short term. She had no significant relief with the caudal injection other than a couple of days.  She was trialed on trochanteric bursa injection that helped for about a week.  Her pain is 6-10/17/08, interferes activity at a moderate level.  She is independent with all of her self-care and mobility.  She walk 45 minutes at a time.  She climbs steps.  She drives.  Her primary physician is prescribing oxycodone and Neurontin as her main pain medication.  She is on Neurontin 600 mg t.i.d. and the oxycodone 15 q.4-6 h p.r.n. she takes may be a couple of days.  ALLERGIES:  MORPHINE, ERYTHROMYCIN, CLONAZEPAM.  SOCIAL HISTORY:  Nonsmoker, nondrinker.  PHYSICAL EXAMINATION:  VITAL SIGNS:  Blood pressure 130/86, pulse 70, respirations 16, and O2 sat 97% on room air. GENERAL:  No acute stress.  Mood and affect appropriate. BACK:  Has tenderness of the PSIS bilaterally, tenderness over the greater trochanters bilaterally, tenderness over the lumbosacral junction paraspinals. She has limited spine range of motion about 50% range, forward flexion, extension, lateral station, and bending.  Straight leg raising test is negative.  Strength is normal lower extremity.  IMPRESSION: 1. Lumbar post laminectomy syndrome. 2. Chronic radiculitis. 3. Sacroiliac disorder.  PLAN: 1. We discussed her treatment options.  In terms of radiculitis,     certainly be reasonable trial her on Lyrica, main draw back to this     would be cost and I have written her a prescription for 75 b.i.d.     working up to t.i.d. over the course of week and she will presses     with her pharmacy if  she want to try this, she will switch from     Neurontin to this and if it is too costly she will stick with her     Neurontin. 2. I will see her back in 2 months.  I would discuss sacroiliac     radiofrequency enterotomy that has been increasing evidence for     efficacy, however, due to variation in the sacroiliac denervation     cannot guarantee that it will be helpful despite good short-term     result with sacroiliac injection. 3. We talked about other treatment options including spinal cord     stimulation.  If she relax to go that route, we will send her to     Dr. Odette Fraction.  Discussed with the patient and agrees with plan.     Erick Colace, M.D. Electronically Signed    AEK/MedQ D:  06/22/2011 11:25:31  T:  06/22/2011 13:03:27  Job #:  [id number]  cc:   Philemon Kingdom, MD Fax: [phone]  Cristi Loron, M.D. Fax: [phone]

## 2011-07-07 ENCOUNTER — Encounter: Payer: Self-pay | Admitting: *Deleted

## 2011-07-10 ENCOUNTER — Encounter: Payer: Self-pay | Admitting: Cardiology

## 2011-07-10 ENCOUNTER — Ambulatory Visit (INDEPENDENT_AMBULATORY_CARE_PROVIDER_SITE_OTHER): Payer: Medicare Other | Admitting: Cardiology

## 2011-07-10 ENCOUNTER — Encounter: Payer: PRIVATE HEALTH INSURANCE | Admitting: Cardiology

## 2011-07-10 VITALS — BP 129/80 | HR 65 | Ht 63.0 in | Wt 149.0 lb

## 2011-07-10 DIAGNOSIS — F17200 Nicotine dependence, unspecified, uncomplicated: Secondary | ICD-10-CM

## 2011-07-10 DIAGNOSIS — I10 Essential (primary) hypertension: Secondary | ICD-10-CM

## 2011-07-10 DIAGNOSIS — R002 Palpitations: Secondary | ICD-10-CM

## 2011-07-10 DIAGNOSIS — R079 Chest pain, unspecified: Secondary | ICD-10-CM

## 2011-07-10 DIAGNOSIS — R0602 Shortness of breath: Secondary | ICD-10-CM

## 2011-07-10 DIAGNOSIS — Z720 Tobacco use: Secondary | ICD-10-CM

## 2011-07-10 NOTE — Assessment & Plan Note (Signed)
If she has a negative stress test no further evaluation of the palpitations will be indicated.  We would treat these symptomatically.

## 2011-07-10 NOTE — Assessment & Plan Note (Signed)
She is going to try patches next.

## 2011-07-10 NOTE — Progress Notes (Signed)
HPI The patient presents for followup of multiple cardiovascular risk factors. She was seen by Dr. Elease Hashimoto for HTN and palpitations.  She has had labile blood pressure but this is mostly related to chronic back pain as they are trying to wean her off of pain medications. She has no prior cardiac history but is a very limited functional capacity because of back pain. He has dyslipidemia and strong smoking. She does get dyspnea with moderate exertion she is not describing PND or orthopnea. She does describe palpitations particularly at night. She might feel a little lightheaded though she said no syncope. They last for 10 seconds at a time. She has had some atypical chest discomfort associated with this. She is not describing neck or arm discomfort, nausea vomiting or diaphoresis. I did review an echocardiogram which was ordered and which was normal.  Allergies  Allergen Reactions  . Erythromycin     REACTION: Swelling  . Latex     REACTION: Blisters  . Morphine Sulfate     REACTION: Halucinations/deep sleep    Current Outpatient Prescriptions  Medication Sig Dispense Refill  . alendronate (FOSAMAX) 70 MG tablet 1 tab weekly      . amitriptyline (ELAVIL) 25 MG tablet Take 25 mg by mouth at bedtime.        Marland Kitchen aspirin 81 MG tablet Take 81 mg by mouth daily.        . calcium carbonate (OS-CAL) 600 MG TABS Take 600 mg by mouth 2 (two) times daily with a meal.        . esomeprazole (NEXIUM) 40 MG capsule Take 40 mg by mouth daily before breakfast.        . estradiol (ESTRACE) 1 MG tablet Take 1 mg by mouth daily.        Marland Kitchen FLUoxetine (PROZAC) 40 MG capsule Take 40 mg by mouth daily.        Marland Kitchen gabapentin (NEURONTIN) 600 MG tablet Take 300 mg by mouth 3 (three) times daily.       . hydrochlorothiazide 25 MG tablet Take 25 mg by mouth daily.        Marland Kitchen levothyroxine (SYNTHROID, LEVOTHROID) 112 MCG tablet Take 112 mcg by mouth daily.        . methocarbamol (ROBAXIN) 750 MG tablet Take 750 mg by mouth 4  (four) times daily. As needed for muscle spasms       . Omega-3 Fatty Acids (FISH OIL) 1000 MG CAPS Take 1,000 mg by mouth 1 day or 1 dose.        . oxyCODONE (OXYCONTIN) 15 MG TB12 Take 15 mg by mouth every 12 (twelve) hours.        . pravastatin (PRAVACHOL) 20 MG tablet Take 20 mg by mouth daily.        Marland Kitchen rOPINIRole (REQUIP) 2 MG tablet Take 2 mg by mouth at bedtime.        . temazepam (RESTORIL) 30 MG capsule Take 30 mg by mouth at bedtime as needed.          Past Medical History  Diagnosis Date  . Essential hypertension, benign   . Thyroid disease   . Hyperlipidemia   . GERD (gastroesophageal reflux disease)   . Palpitations   . Chronic pain syndrome   . Insomnia   . TIA (transient ischemic attack)   . Anxiety   . Restless leg syndrome   . Degenerative disc disease   . Degenerative joint disease   . Osteoarthritis  Past Surgical History  Procedure Date  . Abdominal hysterectomy   . Cataract extraction, bilateral   . Lumbar spine surgery     x 3  . Appendectomy     ROS:  As stated in the HPI and negative for all other systems.  PHYSICAL EXAM BP 129/80  Pulse 65  Ht 5\' 3"  (1.6 m)  Wt 149 lb (67.586 kg)  BMI 26.39 kg/m2 GENERAL:  Well appearing HEENT:  Pupils equal round and reactive, fundi not visualized, oral mucosa unremarkable NECK:  No jugular venous distention, waveform within normal limits, carotid upstroke brisk and symmetric, no bruits, no thyromegaly LYMPHATICS:  No cervical, inguinal adenopathy LUNGS:  Clear to auscultation bilaterally BACK:  No CVA tenderness, back brace in place. CHEST:  Unremarkable HEART:  PMI not displaced or sustained,S1 and S2 within normal limits, no S3, no S4, no clicks, no rubs, no murmurs ABD:  Flat, positive bowel sounds normal in frequency in pitch, no bruits, no rebound, no guarding, no midline pulsatile mass, no hepatomegaly, no splenomegaly EXT:  2 plus pulses throughout, no edema, no cyanosis no clubbing SKIN:  No  rashes no nodules NEURO:  Cranial nerves II through XII grossly intact, motor grossly intact throughout PSYCH:  Cognitively intact, oriented to person place and time  ASSESSMENT AND PLAN

## 2011-07-10 NOTE — Assessment & Plan Note (Signed)
Her labile BP is a pain issue primarily.  No change in therapy is indicated.

## 2011-07-10 NOTE — Patient Instructions (Signed)
Your physician has requested that you have a lexiscan myoview. For further information please visit [email or web address] Please follow instruction sheet, as given.  You will be called with the results of your testing.  The current medical regimen is effective;  continue present plan and medications.

## 2011-07-10 NOTE — Assessment & Plan Note (Signed)
Given her risk factors, prior to back surgery, exercise treadmill testing would be indicated. She cannot exercise and therefore she will need a [employer].

## 2011-07-11 LAB — CREATININE, SERUM: Creatinine, Ser: 0.92

## 2011-07-13 LAB — CBC
HCT: 42.3
HCT: 34.3 — ABNORMAL LOW
MCHC: 34.3
MCHC: 34
MCV: 92.5
MCV: 92.7
MCV: 93.2
Platelets: 316
Platelets: 230
Platelets: 258
RDW: 14.5
RDW: 14.2
WBC: 11.5 — ABNORMAL HIGH

## 2011-07-13 LAB — BASIC METABOLIC PANEL
BUN: 13
BUN: 7
CO2: 27
CO2: 29
Chloride: 102
Chloride: 100
Creatinine, Ser: 0.74
Glucose, Bld: 87
Potassium: 4.2

## 2011-07-19 ENCOUNTER — Encounter: Payer: Self-pay | Admitting: *Deleted

## 2011-07-19 NOTE — Progress Notes (Signed)
Copy of Nuclear study faxed to Dr. Elvina Sidle [phone] per pt. Request.Falecha L Clark

## 2011-07-20 ENCOUNTER — Other Ambulatory Visit (HOSPITAL_COMMUNITY): Payer: Medicare Other | Admitting: Radiology

## 2011-07-20 NOTE — Progress Notes (Signed)
Per Dr Antoine Poche  - pt is cleared for surgery.  Pt is aware of results.

## 2011-08-02 NOTE — Telephone Encounter (Signed)
ok 

## 2011-08-03 ENCOUNTER — Encounter: Payer: Medicare Other | Attending: Neurosurgery | Admitting: Neurosurgery

## 2011-08-22 ENCOUNTER — Ambulatory Visit (HOSPITAL_BASED_OUTPATIENT_CLINIC_OR_DEPARTMENT_OTHER): Payer: Medicare Other | Admitting: Physical Medicine & Rehabilitation

## 2011-08-22 ENCOUNTER — Encounter: Payer: Medicare Other | Attending: Physical Medicine & Rehabilitation

## 2011-08-22 ENCOUNTER — Ambulatory Visit: Payer: Medicare Other | Admitting: Physical Medicine & Rehabilitation

## 2011-08-22 DIAGNOSIS — M545 Low back pain, unspecified: Secondary | ICD-10-CM | POA: Insufficient documentation

## 2011-08-22 DIAGNOSIS — M25559 Pain in unspecified hip: Secondary | ICD-10-CM | POA: Insufficient documentation

## 2011-08-22 DIAGNOSIS — M961 Postlaminectomy syndrome, not elsewhere classified: Secondary | ICD-10-CM

## 2011-08-22 DIAGNOSIS — M76899 Other specified enthesopathies of unspecified lower limb, excluding foot: Secondary | ICD-10-CM | POA: Insufficient documentation

## 2011-08-22 DIAGNOSIS — M533 Sacrococcygeal disorders, not elsewhere classified: Secondary | ICD-10-CM | POA: Insufficient documentation

## 2011-08-22 DIAGNOSIS — F17200 Nicotine dependence, unspecified, uncomplicated: Secondary | ICD-10-CM | POA: Insufficient documentation

## 2011-08-22 NOTE — Procedures (Signed)
[name], Emily Leonard                 ACCOUNT NO.:  [id number]  MEDICAL RECORD NO.:  [id number]           PATIENT TYPE:  O  LOCATION:  [facility]                          FACILITY:  [facility]  PHYSICIAN:  Erick Colace, M.D.DATE OF BIRTH:  November 09, 1945  DATE OF PROCEDURE:  08/22/2011 DATE OF DISCHARGE:                              OPERATIVE REPORT  PROCEDURE:  Left L5, left S1, left S2 dorsal ramus radiofrequency neurotomy.  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 Pain has been temporarily relieved by sacroiliac injections.  PROCEDURE:  Informed consent was obtained after describing risks and benefits of the procedure with the patient.  These include bleeding, bruising, and infection.  She elects to proceed and has given written consent.  The patient was placed prone on fluoroscopy table.  Betadine prep, sterile drape.  Time-out taken to assure proper patient and procedure.  The patient placed prone on fluoroscopy table.  A 25-gauge inches half needle was used to anesthetize skin subcu tissue, 1% lidocaine of 2 mL x3 spots.  Then, a 22-gauge 3-1/2 inch spinal needle was inserted under fluoroscopic guidance, targeting the left S1 SAP sacral ala junction, bone contact made, confirmed with lateral imaging. Sensory stimulation at 50 Hz followed by motor stimulation at 2 Hz, confirmed proper needle location, followed by injection 1 mL of solution containing 1 mL of 4 mg/mL dexamethasone and 2 mL of 1% MPF lidocaine. Then, left S1 lateral aspect of the foramen was approximated with needle.  Sensory stimulation at 50 Hz followed by motor stimulation at 2 Hz, confirmed proper needle location, followed by injection 1 mL dexamethasone-lidocaine solution.  Then, the left S2 lateral aspect of the sacral foramen was approximated by a needle.  Sensory stimulation at 50 Hz followed by motor stimulation at 2 Hz, confirmed proper needle location, followed by injection 1 mL of  dexamethasone-lidocaine solution.  Radiofrequency was performed at 70 degrees, time 90 seconds at all 3 sites at 1 time.  The patient tolerated procedure well. Postprocedure instructions given.     Erick Colace, M.D. Electronically Signed    AEK/MEDQ  D:  08/22/2011 15:24:59  T:  08/22/2011 19:07:14  Job:  [id number]

## 2011-09-22 ENCOUNTER — Encounter: Payer: Medicare Other | Attending: Physical Medicine & Rehabilitation

## 2011-09-22 ENCOUNTER — Ambulatory Visit (HOSPITAL_BASED_OUTPATIENT_CLINIC_OR_DEPARTMENT_OTHER): Payer: Medicare Other | Admitting: Physical Medicine & Rehabilitation

## 2011-09-22 DIAGNOSIS — M25559 Pain in unspecified hip: Secondary | ICD-10-CM | POA: Insufficient documentation

## 2011-09-22 DIAGNOSIS — M76899 Other specified enthesopathies of unspecified lower limb, excluding foot: Secondary | ICD-10-CM | POA: Insufficient documentation

## 2011-09-22 DIAGNOSIS — F17200 Nicotine dependence, unspecified, uncomplicated: Secondary | ICD-10-CM | POA: Insufficient documentation

## 2011-09-22 DIAGNOSIS — M533 Sacrococcygeal disorders, not elsewhere classified: Secondary | ICD-10-CM | POA: Insufficient documentation

## 2011-09-22 DIAGNOSIS — M961 Postlaminectomy syndrome, not elsewhere classified: Secondary | ICD-10-CM | POA: Insufficient documentation

## 2011-09-22 DIAGNOSIS — M545 Low back pain, unspecified: Secondary | ICD-10-CM | POA: Insufficient documentation

## 2011-09-22 NOTE — Assessment & Plan Note (Signed)
HISTORY:  Ms. Haros is a 65 year old female who I last saw 1 month ago for sacroiliac RF on the left side.  Her left-sided back pain is 80% better.  She still has bilateral hip pain, this is more on the side of her hip that she grades as a 5/10.  She can walk 30 minutes at a time. She climbs steps.  She drives.  INTERVAL MEDICAL HISTORY:  Positive for pneumonia, has not had any fevers today, still smokes.  PAST SURGICAL HISTORY:  L2-L5 fusions.  The patient is on disability.  She needs help with certain household duties and shopping, but otherwise independent.  Her current pain medications include gabapentin 600 mg t.i.d.  ALLERGIES:  MORPHINE and ERYTHROMYCIN.  PHYSICAL EXAMINATION:  VITAL SIGNS:  Blood pressure 143/86, pulse 66, respirations 18, and O2 saturation 97% on room air. GENERAL:  No acute distress.  Mood and affect appropriate. MUSCULOSKELETAL:  Her hips have tenderness to palpation right greater left side over the greater trochanter.  No pain over the PSIS.  Her range of motion in lumbar spine is about 50% flexion, extension, lateral rotation, and bending.  Straight leg raising test is negative.  Her hip range of motion is full.  She has minor tenderness over the greater trochanter on the right side with hip internal and external rotation. Deep tendon reflexes are normal.  Straight leg raising test is negative.  IMPRESSION: 1. Lumbar post laminectomy syndrome. 2. 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     I did L5-S1 and S2. 3. Bilateral trochanteric bursitis of the hips.  I showed her some     exercises for this, she has previously had some PT exercises as     well.  She is already on medications.  We will do right hip     trochanteric bursa injection, and if successful, do the left side     next month if needed.  Discussed with the patient and agrees with     plan.     Erick Colace, M.D. Electronically  Signed    AEK/MedQ D:  09/22/2011 11:19:31  T:  09/22/2011 13:20:27  Job #:  [id number]

## 2011-10-26 ENCOUNTER — Encounter: Payer: Medicare Other | Attending: Physical Medicine & Rehabilitation

## 2011-10-26 ENCOUNTER — Ambulatory Visit (HOSPITAL_BASED_OUTPATIENT_CLINIC_OR_DEPARTMENT_OTHER): Payer: Medicare Other | Admitting: Physical Medicine & Rehabilitation

## 2011-10-26 DIAGNOSIS — M76899 Other specified enthesopathies of unspecified lower limb, excluding foot: Secondary | ICD-10-CM | POA: Insufficient documentation

## 2011-10-26 DIAGNOSIS — M533 Sacrococcygeal disorders, not elsewhere classified: Secondary | ICD-10-CM | POA: Insufficient documentation

## 2011-10-26 DIAGNOSIS — M25559 Pain in unspecified hip: Secondary | ICD-10-CM | POA: Insufficient documentation

## 2011-10-26 DIAGNOSIS — M961 Postlaminectomy syndrome, not elsewhere classified: Secondary | ICD-10-CM | POA: Insufficient documentation

## 2011-10-26 DIAGNOSIS — M545 Low back pain, unspecified: Secondary | ICD-10-CM | POA: Insufficient documentation

## 2011-10-26 DIAGNOSIS — G894 Chronic pain syndrome: Secondary | ICD-10-CM

## 2011-10-26 DIAGNOSIS — F17200 Nicotine dependence, unspecified, uncomplicated: Secondary | ICD-10-CM | POA: Insufficient documentation

## 2011-10-27 NOTE — Procedures (Signed)
Emily Leonard, SEEMAN                 ACCOUNT NO.:  [id number]  MEDICAL RECORD NO.:  [id number]           PATIENT TYPE:  O  LOCATION:  [facility]                          FACILITY:  [facility]  PHYSICIAN:  Kerrigan Glendening L. Ratasha Fabre, ANP-CDATE OF BIRTH:  1946/06/15  DATE OF PROCEDURE:  10/26/2011 DATE OF DISCHARGE:                              OPERATIVE REPORT  This is patient of Dr. Wynn Banker, who followed today just for a right greater trochanter injections.  She states her pain is worsened on that side.  She is following up with Dr. Delma Officer at [facility] and about a month regarding her back, rates her average pain at 4. Pain is worse in the morning.  Sleep patterns are fair, injections, medication helps some.  She walks independently.  She drives a car.  She walks 30 minutes at a time.  She is on disability, needs help with household duties.  REVIEW OF SYSTEMS:  Notable for difficulties as described above otherwise within normal limits.  Past medical history, social history, family history are unchanged.  PHYSICAL EXAMINATION:  VITAL SIGNS:  Blood pressure 126/80, pulse 70, respirations 16, O2 sats 99 on room air. MUSCULOSKELETAL:  Motor strength sensation intact. NEUROLOGIC:  Constitutionally, she is within normal limits.  She is alert and oriented x3.  She has a normal gait.  She is point tender over the right greater trochanter area.  ASSESSMENT:  Greater trochanter bursitis, right greater than left.  PLAN:  After informed consent, the risks and benefits were explained. Betadine prep just posterior right greater trochanter, I injected with 3 mL of lidocaine and 1 mL of 40 mg Depo-Medrol.  She tolerated it well. There is no bleeding.  No blood draw back.  She knows to ice that area tonight.  She will follow up with Dr. Wynn Banker in a month or sooner if needed and Dr. Delma Officer in 1 month.  Her questions were encouraged and answered.     Lashawn Bromwell L. Blima Dessert Electronically Signed    RLW/MEDQ  D:  10/26/2011 10:59:01  T:  10/27/2011 04:18:03  Job:  [id number]

## 2011-11-21 ENCOUNTER — Ambulatory Visit: Payer: Medicare Other | Admitting: Physical Medicine & Rehabilitation

## 2011-11-24 ENCOUNTER — Ambulatory Visit: Payer: Medicare Other | Admitting: Physical Medicine & Rehabilitation

## 2011-11-30 ENCOUNTER — Ambulatory Visit (HOSPITAL_BASED_OUTPATIENT_CLINIC_OR_DEPARTMENT_OTHER): Payer: Medicare Other | Admitting: Physical Medicine & Rehabilitation

## 2011-11-30 ENCOUNTER — Encounter: Payer: Medicare Other | Attending: Physical Medicine & Rehabilitation

## 2011-11-30 DIAGNOSIS — M533 Sacrococcygeal disorders, not elsewhere classified: Secondary | ICD-10-CM

## 2011-11-30 DIAGNOSIS — M545 Low back pain, unspecified: Secondary | ICD-10-CM | POA: Insufficient documentation

## 2011-11-30 DIAGNOSIS — M76899 Other specified enthesopathies of unspecified lower limb, excluding foot: Secondary | ICD-10-CM | POA: Insufficient documentation

## 2011-11-30 DIAGNOSIS — F17200 Nicotine dependence, unspecified, uncomplicated: Secondary | ICD-10-CM | POA: Insufficient documentation

## 2011-11-30 DIAGNOSIS — M25559 Pain in unspecified hip: Secondary | ICD-10-CM | POA: Insufficient documentation

## 2011-11-30 DIAGNOSIS — M961 Postlaminectomy syndrome, not elsewhere classified: Secondary | ICD-10-CM | POA: Insufficient documentation

## 2011-11-30 NOTE — Assessment & Plan Note (Signed)
A 66 year old female with history of lumbar post laminectomy syndrome. She has had L4-5, L5-S1 fusions.  She has had sacroiliac RF on the left side, that has helped with her left-sided low back pain.  She was able to come off narcotics.  She was able to come off of tramadol, but now complaining more pain on the right side in the similar location.  She also has pain when she lies on her hips on both sides.  She is frustrated.  We discussed she also had some radiating pain in the lower extremities, bilaterally in the feet.  PHYSICAL EXAMINATION:  GENERAL:  No acute distress.  Mood and affect is labile. VITAL SIGNS:  Blood pressure 150/97, pulse 82, respirations 16, O2 sat 100% on room air. I asked her to contact her primary physician about her blood pressure, weight 159 pounds, height 5 feet 4 inches. MUSCULOSKELETAL:  She has pain to palpation, bilateral greater trochanters.  She also has limited range of motion in lumbar spine.  Her gait is normal.  IMPRESSION: 1. Lumbar post laminectomy syndrome.  She also has a sacroiliac     disorder post lumbar fusion.  Had good result with left sacroiliac     radiofrequency.  I will do the right side sacroiliac injection and     if she responds to this temporarily, she may benefit from a right-     sided RF. 2. Trochanteric bursitis.  We will inject bilateral bursa today. 3. She is really not taking pain medicine at this time.  We will     restart the tramadol 50 mg t.i.d., which was helpful, but she     states she ran out.  She forgot to ask [place] for anymore. 4. We will bump up her gabapentin to 800 t.i.d.     Erick Colace, M.D. Electronically Signed    AEK/MedQ D:  11/30/2011 11:57:02  T:  11/30/2011 23:32:30  Job #:  [id number]  cc:   Cristi Loron, M.D. Fax: [phone]  Philemon Kingdom, MD Fax: [phone]

## 2011-12-01 NOTE — Procedures (Signed)
NAMEDELORSE, SHANE NO.:  [id number]  MEDICAL RECORD NO.:  [id number]           PATIENT TYPE:  O  LOCATION:  [facility]                          FACILITY:  [facility]  PHYSICIAN:  Erick Colace, M.D.DATE OF BIRTH:  1946/05/24  DATE OF PROCEDURE: DATE OF DISCHARGE:                              OPERATIVE REPORT  This is a bilateral trochanteric bursa injection.  INDICATION:  Bilateral trochanteric bursitis.  Pain is only partially responsive to medication management, interfering with self-care and asleep in particular.  Informed consent was obtained after describing risks and benefits of the procedure with the patient.  These include bleeding, bruising, infection.  She elects to proceed and has given written consent.  She has already failed physical therapy stretching.  Not a good candidate for nonsteroidals, does not tolerate and has other medical comorbidities.  The left hip was marked and prepped with Betadine, alcohol and sprayed with Vapocoolant spray, then entered with a 25-gauge 3-inch spinal needle was inserted to bone contact.  A 2.5 mL of solution containing 1 mL of 40 mg/mL Depo-Medrol and 4 mL of 1% lidocaine was injected.  The same procedure was repeated on the right side using same needle, injectate, and technique.  The patient tolerated the procedure well. Postprocedure instructions given.     Erick Colace, M.D. Electronically Signed    AEK/MEDQ  D:  11/30/2011 11:54:06  T:  12/01/2011 00:13:04  Job:  [id number]

## 2011-12-25 ENCOUNTER — Encounter: Payer: Self-pay | Admitting: Physical Medicine & Rehabilitation

## 2011-12-25 ENCOUNTER — Encounter: Payer: Medicare Other | Attending: Physical Medicine & Rehabilitation

## 2011-12-25 ENCOUNTER — Ambulatory Visit (HOSPITAL_BASED_OUTPATIENT_CLINIC_OR_DEPARTMENT_OTHER): Payer: Medicare Other | Admitting: Physical Medicine & Rehabilitation

## 2011-12-25 VITALS — BP 134/63 | HR 68 | Resp 16 | Ht 64.0 in | Wt 157.6 lb

## 2011-12-25 DIAGNOSIS — M533 Sacrococcygeal disorders, not elsewhere classified: Secondary | ICD-10-CM | POA: Insufficient documentation

## 2011-12-25 DIAGNOSIS — M76899 Other specified enthesopathies of unspecified lower limb, excluding foot: Secondary | ICD-10-CM | POA: Insufficient documentation

## 2011-12-25 DIAGNOSIS — M545 Low back pain, unspecified: Secondary | ICD-10-CM | POA: Insufficient documentation

## 2011-12-25 DIAGNOSIS — M961 Postlaminectomy syndrome, not elsewhere classified: Secondary | ICD-10-CM | POA: Insufficient documentation

## 2011-12-25 DIAGNOSIS — F17200 Nicotine dependence, unspecified, uncomplicated: Secondary | ICD-10-CM | POA: Insufficient documentation

## 2011-12-25 DIAGNOSIS — M25559 Pain in unspecified hip: Secondary | ICD-10-CM | POA: Insufficient documentation

## 2011-12-25 NOTE — Progress Notes (Signed)
  PROCEDURE RECORD [facility]   Name: Emily Leonard DOB:April 10, 1946 MRN: [id number]  Date:12/25/2011  Physician: Claudette Laws, MD    Nurse/CMA: Savina Olshefski RN  Allergies:  Allergies  Allergen Reactions  . Erythromycin     REACTION: Swelling  . Latex     REACTION: Blisters  . Morphine Sulfate     REACTION: Halucinations/deep sleep  . Clozapine Rash    Consent Signed: yes  Is patient diabetic? no  CBG today   Pregnant: no LMP: No LMP recorded. (age 24-55)  13yrs  Anticoagulants: no Anti-inflammatory: no Antibiotics: no  Procedure: right SI joint injection Position: Prone Start Time: 10:14 End Time: 10:18 Fluoro Time: 12 sec  RN/CMA Haematologist RN    Time 9:45 10:22    BP 134/63 137/69    Pulse 68 71    Respirations 16 16    O2 Sat 99% 98%    S/S 6 6    Pain Level 9/10 2/10     D/C home with husband Ronnie, [name], D/C instructions reviewed, and sits independently.

## 2011-12-25 NOTE — Progress Notes (Signed)
Right sacroiliac injection under fluoroscopic guidance  Indication: Right Low back and buttocks pain not relieved by medication management and other conservative care.  Informed consent was obtained after describing risks and benefits of the procedure with the patient, this includes bleeding, bruising, infection, paralysis and medication side effects. The patient wishes to proceed and has given written consent. The patient was placed in a prone position. The lumbar and sacral area was marked and prepped with Betadine. A 25-gauge 1-1/2 inch needle was inserted into the skin and subcutaneous tissue and 1 mL of 1% lidocaine was injected. Then a 25-gauge 3 inch spinal needle was inserted under fluoroscopic guidance into the left sacroiliac joint. AP and lateral images were utilized. Omnipaque 180x0.5 mL under live fluoroscopy demonstrated no intravascular uptake. Then a solution containing one ML of 40 mg per mL depomedrol and 2 ML of 1% lidocaine MPF was injected x1.5 mL. Patient tolerated the procedure well. Post procedure instructions were given. Please see post procedure form.

## 2011-12-25 NOTE — Patient Instructions (Addendum)
Sacroiliac Joint Dysfunction The sacroiliac joint connects the lower part of the spine (the sacrum) with the bones of the pelvis. CAUSES  Sometimes, there is no obvious reason for sacroiliac joint dysfunction. Other times, it may occur   During pregnancy.   After injury, such as:   Car accidents.   Sport-related injuries.   Work-related injuries.   Due to one leg being shorter than the other.   Due to other conditions that affect the joints, such as:   Rheumatoid arthritis.   Gout.   Psoriasis.   Joint infection (septic arthritis).  SYMPTOMS  Symptoms may include:  Pain in the:   Lower back.   Buttocks.   Groin.   Thighs and legs.   Difficult sitting, standing, walking, lying, bending or lifting.  DIAGNOSIS  A number of tests may be used to help diagnose the cause of sacroiliac joint dysfunction, including:  Imaging tests to look for other causes of pain, including:   MRI.   CT scan.   Bone scan.   Diagnostic injection: During a special x-ray (called fluoroscopy), a needle is put into the sacroiliac joint. A numbing medicine is injected into the joint. If the pain is improved or stopped, the diagnosis of sacroiliac joint dysfunction is more likely.  TREATMENT  There are a number of types of treatment used for sacroiliac joint dysfunction, including:  Only take over-the-counter or prescription medicines for pain, discomfort, or fever as directed by your caregiver.   Medications to relax muscles.   Rest. Decreasing activity can help cut down on painful muscle spasms and allow the back to heal.   Application of heat or ice to the lower back may improve muscle spasms and soothe pain.   Brace. A special back brace, called a sacroiliac belt, can help support the joint while your back is healing.   Physical therapy can help teach comfortable positions and exercises to strengthen muscles that support the sacroiliac joint.   Cortisone injections. Injections  of steroid medicine into the joint can help decrease swelling and improve pain.   Hyaluronic acid injections. This chemical improves lubrication within the sacroiliac joint, thereby decreasing pain.   Radiofrequency ablation. A special needle is placed into the joint, where it burns away nerves that are carrying pain messages from the joint.   Surgery. Because pain occurs during movement of the joint, screws and plates may be installed in order to limit or prevent joint motion.  HOME CARE INSTRUCTIONS   Take all medications exactly as directed.   Follow instructions regarding both rest and physical activity, to avoid worsening the pain.   Do physical therapy exercises exactly as prescribed.  SEEK IMMEDIATE MEDICAL CARE IF:  You experience increasingly severe pain.   You develop new symptoms, such as numbness or tingling in your legs or feet.   You lose bladder or bowel control.  Document Released: 12/29/2008 Document Revised: 09/21/2011 Document Reviewed: 12/29/2008 Exit[employer] Patient Information 2012 [employer]. 

## 2011-12-29 ENCOUNTER — Telehealth: Payer: Self-pay | Admitting: Physical Medicine & Rehabilitation

## 2011-12-29 NOTE — Telephone Encounter (Signed)
Emily Leonard had injection 12/25/11 and it didn't work, and is in a lot of pain. She is wondering if you can move up the "burning of the nerve".

## 2011-12-29 NOTE — Telephone Encounter (Signed)
If the injection did not work at all the radiofrequency procedure would also not be helpful. If the injection worked short-term and then the radiofrequency procedure would be helpful.

## 2011-12-29 NOTE — Telephone Encounter (Signed)
Patient in a lot of pain.  Had injection on Monday with no results.  Please call.

## 2012-01-01 ENCOUNTER — Telehealth: Payer: Self-pay | Admitting: *Deleted

## 2012-01-01 DIAGNOSIS — M25551 Pain in right hip: Secondary | ICD-10-CM

## 2012-01-01 NOTE — Telephone Encounter (Signed)
I ordered hip xray.  Make appt for her to see me and we can review her options.

## 2012-01-01 NOTE — Telephone Encounter (Signed)
I relayed MD response to Emily Leonard about her last injection.  She said she is no better than last week, and got no relief from injection so RF is not an option. She is currently receiving Tramadol and Gabapentin from our office and Oxycontin from PCP, but is still in pain.

## 2012-01-01 NOTE — Telephone Encounter (Signed)
I relayed MD response to Emily Leonard about her last injection.  She said she is no better than last week, and got no relief from injection so RF is not an option. She is currently receiving Tramadol and Gabapentin from our office and Oxycontin from PCP, but is still in pain. 

## 2012-01-01 NOTE — Telephone Encounter (Signed)
I spoke with Emily Leonard telling her that she could follow up with Dr Lovell Sheehan or pursue the hip xray.  She insists Dr Lovell Sheehan was supposed to follow up with Dr Wynn Banker, and that Lovell Sheehan has the MRI disc. She wants to wait on the xray until Dr Lovell Sheehan communicates with Dr Wynn Banker.

## 2012-01-04 ENCOUNTER — Telehealth: Payer: Self-pay | Admitting: Physical Medicine & Rehabilitation

## 2012-01-04 NOTE — Telephone Encounter (Signed)
Called last week.  She is in a lot of pain.  Injection not working.

## 2012-01-05 NOTE — Telephone Encounter (Signed)
Please advise. Pt states that Dr. Lovell Sheehan made a suggestion about a myelogram, she is going to schedule that with him but wants to know what to do in the meantime.

## 2012-01-05 NOTE — Telephone Encounter (Signed)
We can see for medication visit/UDS as an add on.  Consider fentanyl patch, I don't see where she has a UDS due to primary MD prescribing meds  The other procedure we can try is a L1 -L2 LESI she has not had this

## 2012-01-05 NOTE — Telephone Encounter (Signed)
Pt aware. Diane to schedule appointment.

## 2012-01-08 ENCOUNTER — Encounter: Payer: Self-pay | Admitting: Physical Medicine & Rehabilitation

## 2012-01-08 ENCOUNTER — Ambulatory Visit (HOSPITAL_BASED_OUTPATIENT_CLINIC_OR_DEPARTMENT_OTHER): Payer: Medicare Other | Admitting: Physical Medicine & Rehabilitation

## 2012-01-08 VITALS — BP 158/87 | HR 76 | Resp 18 | Ht 64.0 in | Wt 164.0 lb

## 2012-01-08 DIAGNOSIS — M961 Postlaminectomy syndrome, not elsewhere classified: Secondary | ICD-10-CM

## 2012-01-08 MED ORDER — FENTANYL 50 MCG/HR TD PT72: 1.0000 | MEDICATED_PATCH | TRANSDERMAL | Status: DC

## 2012-01-08 NOTE — Progress Notes (Signed)
Educated in safe use and disposal of fentanyl patch

## 2012-01-08 NOTE — Progress Notes (Signed)
  Subjective:    Patient ID: Emily Leonard, female    DOB: 1946-03-13, 66 y.o.   MRN: [id number]  HPIPain Inventory Average Pain 7  9 Pain Right Now 7 My pain is constant, sharp and stabbing  In the last 24 hours, has pain interfered with the following? General activity 8 Relation with others 8 Enjoyment of life 8 What TIME of day is your pain at its worst? morning Sleep (in general) Fair  Pain is worse with: sitting and some activites twisting motion, as w/ sweeping floor Pain improves with: medication and TENS Relief from Meds: 6  Mobility walk without assistance how many minutes can you walk? 30 ability to climb steps?  yes do you drive?  yes  Function retired I need assistance with the following:  household duties  Neuro/Psych No problems in this area  Prior Studies Any changes since last visit?  no  Physicians involved in your care Any changes since last visit?  no         Review of Systems  Constitutional: Negative.   HENT: Negative.   Eyes: Negative.   Respiratory: Negative.   Cardiovascular: Negative.   Gastrointestinal: Negative.   Genitourinary: Negative.   Musculoskeletal: Positive for myalgias, back pain and arthralgias.  Skin: Negative.   Neurological: Negative.   Hematological: Negative.   Psychiatric/Behavioral: Negative.        Objective:   Physical Exam        Assessment & Plan:

## 2012-01-08 NOTE — Patient Instructions (Signed)
Epidural Steroid Injection An epidural steroid injection is given to relieve pain in the neck, back, or legs. This procedure involves injecting a steroid and numbing medicine (local anesthetic) into the epidural space. The epidural space is the space between the outer covering of the spinal cord and the vertebra. The epidural steroid injection helps in reducing the pain that is caused by the irritation or swelling of the nerve root. However, it does not cure the underlying problem. The injection may be given for the following conditions:  Changes in the soft, gel-like cushion between two vertebrae (disk) due to wear and tear.   A reduction in the space within the spinal canal.   Slipped or herniated disk.   Low back (lumbar) sprain.   Sciatica. This is shooting pain that radiates down the buttocks and the back of the leg due to compression of the nerve.   Traumatic compression fracture of the vertebra.   Pain that develops after a surgery of the spine.   Pain that arises after an attack of viral infection affecting the nerves (shingles).  LET YOUR CAREGIVER KNOW ABOUT:   Allergies to food or medicine.   Medicines taken, including vitamins, herbs, eyedrops, over-the-counter medicines, and creams.   Use of steroids (by mouth or creams).   Previous problems with anesthetics or numbing medicines.   History of bleeding problems or blood clots.   Previous surgery.   Other health problems, including diabetes and kidney problems.   Possibility of pregnancy, if this applies.     RISKS AND COMPLICATIONS The complications due to the needle insertion are:  Headache.   Bleeding.   Infection.   Allergic reaction to the medicines.   Damage to the nerves.  The complications due to the steroid are:  Weight gain.   Hot flashes.   Mood swings.   Lack of sleep.   Increase in blood sugar levels, especially if you are diabetic.   Retention of water.  The response to this  procedure depends on the underlying cause of the pain and its duration. Patients who have long-term (chronic) pain are less likely to benefit from epidural steroids than are patients whose pain comes on strong and suddenly. BEFORE THE PROCEDURE   The caregiver may ask about your symptoms, do a detailed exam, and advise some tests. These tests may include imaging studies. Your caregiver may review the results of your tests and discuss the procedure with you.   Ask your caregiver about changing or stopping your regular medicines. You may be advised to stop taking blood-thinning medicines a few days before the procedure.   You may also be given medicines to reduce your anxiety.  PROCEDURE  You will remain awake during the whole procedure. Although, you may receive medicine to make you sleepy. You will be asked to lie on your stomach. The site of the injection is cleansed. Then, the injection site is numbed using a small amount of medicine that numbs the area (local anesthetic). A hollow needle is directed through your skin into the epidural space with the help of an X-ray. The X-ray helps to ensure that the steroid is delivered closest to the affected nerve. You may have some minimal discomfort at this time. Once the needle is in the right position, the local anesthetic and the steroid are injected into the epidural space. The needle is then removed. The skin is cleaned and a bandage is applied. The entire procedure takes only a few minutes, although repeated injections may   be required (up to 3 to 4 injections over several weeks).  AFTER THE PROCEDURE   You may be monitored for a short time before you go home.   You may feel weakness or numbness in your arm or leg, which disappears within 1 to 2 hours.   Someone must drive you home or accompany you home if you are taking a taxi.   You may be allowed to eat, drink, and take your regular medicine.   Your pain may improve or worsen right after the  procedure.   You may feel the beneficial effect of the steroid a few days later.   You may have soreness at the site of the injection.   If you have only partial relief of the pain, the injection may be repeated once or even twice within 4 to 8 weeks of the initial injection.  Document Released: 01/09/2008 Document Revised: 09/21/2011 Document Reviewed: 02/11/2009 [employer] Patient Information 2012 [employer]. 

## 2012-01-10 ENCOUNTER — Telehealth: Payer: Self-pay | Admitting: *Deleted

## 2012-01-10 ENCOUNTER — Encounter: Payer: Self-pay | Admitting: Physical Medicine & Rehabilitation

## 2012-01-10 ENCOUNTER — Other Ambulatory Visit: Payer: Self-pay | Admitting: *Deleted

## 2012-01-10 NOTE — Telephone Encounter (Signed)
LM for pt to call back. Pt had inconsistent UDS.

## 2012-01-10 NOTE — Telephone Encounter (Signed)
Message copied by Caryl Ada on Wed Jan 10, 2012 12:57 PM ------      Message from: Ricarda Frame E      Created: Wed Jan 10, 2012 11:56 AM       No oxycodone.  Looks like we were prescribing.  Will discharge pt due to inconsistent UDS.  Offer Ringer.  Cancel further appts

## 2012-01-11 ENCOUNTER — Telehealth: Payer: Self-pay | Admitting: Physical Medicine & Rehabilitation

## 2012-01-15 NOTE — Telephone Encounter (Signed)
Pt has not called back. Closing Encounter.

## 2012-01-17 ENCOUNTER — Other Ambulatory Visit: Payer: Self-pay | Admitting: *Deleted

## 2012-01-17 ENCOUNTER — Telehealth: Payer: Self-pay | Admitting: *Deleted

## 2012-01-17 NOTE — Telephone Encounter (Signed)
Discussed UDS report of no oxycodone present in specimen. Her oxycodone was prescribed by PCP. She was using on prn basis. Could go up to 3 days w/o taking any. Did report taking a dose on day of UDS. (She was non-narcotic management until 01/08/12 when UDS collected and Rx for fentanyl given.)  Fentanyl is making her nauseated and giving her about 20% relief of pain.  Did not discuss your order to discharge her from practice, pending your review of above factors.

## 2012-01-18 ENCOUNTER — Encounter: Payer: Self-pay | Admitting: Physical Medicine & Rehabilitation

## 2012-01-22 ENCOUNTER — Encounter: Payer: Self-pay | Admitting: Physical Medicine & Rehabilitation

## 2012-01-22 ENCOUNTER — Ambulatory Visit (HOSPITAL_BASED_OUTPATIENT_CLINIC_OR_DEPARTMENT_OTHER): Payer: Medicare Other | Admitting: Physical Medicine & Rehabilitation

## 2012-01-22 ENCOUNTER — Ambulatory Visit (HOSPITAL_COMMUNITY)
Admission: RE | Admit: 2012-01-22 | Discharge: 2012-01-22 | Disposition: A | Discharge status: Home / Self Care | Payer: Medicare Other | Source: Ambulatory Visit | Attending: Physical Medicine & Rehabilitation | Admitting: Physical Medicine & Rehabilitation

## 2012-01-22 ENCOUNTER — Encounter: Payer: Medicare Other | Attending: Physical Medicine & Rehabilitation

## 2012-01-22 VITALS — BP 135/79 | HR 92 | Resp 16 | Ht 64.0 in | Wt 162.4 lb

## 2012-01-22 DIAGNOSIS — M25551 Pain in right hip: Secondary | ICD-10-CM

## 2012-01-22 DIAGNOSIS — M545 Low back pain, unspecified: Secondary | ICD-10-CM | POA: Insufficient documentation

## 2012-01-22 DIAGNOSIS — G894 Chronic pain syndrome: Secondary | ICD-10-CM

## 2012-01-22 DIAGNOSIS — IMO0001 Reserved for inherently not codable concepts without codable children: Secondary | ICD-10-CM | POA: Insufficient documentation

## 2012-01-22 DIAGNOSIS — M533 Sacrococcygeal disorders, not elsewhere classified: Secondary | ICD-10-CM | POA: Insufficient documentation

## 2012-01-22 DIAGNOSIS — M25559 Pain in unspecified hip: Secondary | ICD-10-CM | POA: Insufficient documentation

## 2012-01-22 DIAGNOSIS — M961 Postlaminectomy syndrome, not elsewhere classified: Secondary | ICD-10-CM | POA: Insufficient documentation

## 2012-01-22 DIAGNOSIS — M76899 Other specified enthesopathies of unspecified lower limb, excluding foot: Secondary | ICD-10-CM | POA: Insufficient documentation

## 2012-01-22 DIAGNOSIS — F17200 Nicotine dependence, unspecified, uncomplicated: Secondary | ICD-10-CM | POA: Insufficient documentation

## 2012-01-22 DIAGNOSIS — Z981 Arthrodesis status: Secondary | ICD-10-CM | POA: Insufficient documentation

## 2012-01-22 MED ORDER — FENTANYL 25 MCG/HR TD PT72: 1.0000 | MEDICATED_PATCH | TRANSDERMAL | Status: DC

## 2012-01-22 NOTE — Patient Instructions (Signed)
Epidural Steroid Injection Care After  Refer to this sheet in the next few weeks. These instructions provide you with information on caring for yourself after your procedure. Your caregiver may also give you more specific instructions. Your treatment has been planned according to current medical practices, but problems sometimes occur. Call your caregiver if you have any problems or questions after your procedure. HOME CARE INSTRUCTIONS   Avoid the use of heat on the injection site for a day.   Do not have a tub bath or soak in water for the rest of the day.   Remove the bandage on the next day.   Resume your normal activities on the next day.   Use ice packs or mild pain relievers to reduce the soreness around the injection site.   Follow up with your caregiver 7 to 10 days after the procedure.  SEEK MEDICAL CARE IF:   You develop a fever of more than 100.5 F (38.1 C).   You continue to have pain and soreness over the injection site even after taking medicines.   You develop significant nausea or vomiting.  SEEK IMMEDIATE MEDICAL CARE IF:   You have severe back pain, which is not relieved by medicines.   You develop severe headache, stiff neck, or sensitivity to light.   You develop any new numbness or weakness of your legs.   You lose control over your bladder or bowel movements.   You develop a fever of more than 102 F (38.9 C).   You develop difficulty breathing.  Document Released: 01/17/2011 Document Revised: 09/21/2011 Document Reviewed: 01/17/2011 [employer] Patient Information 2012 [employer]. 

## 2012-01-22 NOTE — Progress Notes (Signed)
  PROCEDURE RECORD [facility]   Name: Emily Leonard DOB:17-May-1946 MRN: [id number]  Date:01/22/2012  Physician: Claudette Laws, MD    Nurse/CMA: Lanissa Cashen RN  Allergies:  Allergies  Allergen Reactions  . Erythromycin     REACTION: Swelling  . Latex     REACTION: Blisters  . Morphine Sulfate     REACTION: Halucinations/deep sleep  . Clozapine Rash  . Fentanyl Nausea Only    "Feels like you are a hundred miles away when talking to her"    Consent Signed: yes  Is patient diabetic? no  CBG today? n/a  Pregnant: no LMP: No LMP recorded. Patient has had a hysterectomy. (age 12-55)  Anticoagulants: no Anti-inflammatory: no Antibiotics: no  Procedure: L1 L2 ESI Position: Prone Start Time:2:00pm  End Time:2:07pm  Fluoro Time: 9 seconds  RN/CMA Harriette Bouillon, CMA    Time 1:05 2:09    BP 135/79 125/77    Pulse 92 80    Respirations 16 16    O2 Sat 94% 94%    S/S 6 6    Pain Level 5/10 4/10     D/C home with [name], [name], D/C instructions reviewed, and sits independently.

## 2012-01-23 NOTE — Telephone Encounter (Signed)
Patient in office for injection on 01/22/12. Not discharges; refill Rx given.

## 2012-01-24 ENCOUNTER — Encounter: Payer: Self-pay | Admitting: Physical Medicine & Rehabilitation

## 2012-01-29 ENCOUNTER — Ambulatory Visit: Payer: Medicare Other | Admitting: Physical Medicine & Rehabilitation

## 2012-02-09 ENCOUNTER — Encounter: Payer: Self-pay | Admitting: Physical Medicine & Rehabilitation

## 2012-02-09 ENCOUNTER — Ambulatory Visit (HOSPITAL_BASED_OUTPATIENT_CLINIC_OR_DEPARTMENT_OTHER): Payer: Medicare Other | Admitting: Physical Medicine & Rehabilitation

## 2012-02-09 VITALS — BP 123/78 | HR 83 | Ht 64.0 in | Wt 160.0 lb

## 2012-02-09 DIAGNOSIS — M7060 Trochanteric bursitis, unspecified hip: Secondary | ICD-10-CM

## 2012-02-09 DIAGNOSIS — M76899 Other specified enthesopathies of unspecified lower limb, excluding foot: Secondary | ICD-10-CM

## 2012-02-09 DIAGNOSIS — M961 Postlaminectomy syndrome, not elsewhere classified: Secondary | ICD-10-CM

## 2012-02-09 MED ORDER — DULOXETINE HCL 30 MG PO CPEP: 30.0000 mg | ORAL_CAPSULE | Freq: Every day | ORAL | Status: DC

## 2012-02-09 NOTE — Patient Instructions (Addendum)
Wear the last Duragesic  Or fentanyl patch for 4 days before taking it off   May start Cymbalta today but he can take up to 4 weeks before we see some result

## 2012-02-09 NOTE — Progress Notes (Signed)
  Subjective:    Patient ID: Emily Leonard, female    DOB: [birth year], 66 y.o.   MRN: [id number]  HPI  no significant improvement after L2-L3  Translaminar injection. His right hip pain today. Has had a left hip injection approximately 2 months ago Pain Inventory Average Pain 5 Pain Right Now 5 My pain is sharp and stabbing  In the last 24 hours, has pain interfered with the following? General activity 5 Relation with others 5 Enjoyment of life 5 What TIME of day is your pain at its worst? morning Sleep (in general) Fair  Pain is worse with: walking, bending, sitting, inactivity, standing and some activites Pain improves with: nothing Relief from Meds: 6  Mobility walk without assistance how many minutes can you walk? 30 min ability to climb steps?  no do you drive?  no  Function retired  Neuro/Psych No problems in this area  Prior Studies Any changes since last visit?  no  Physicians involved in your care Any changes since last visit?  no       Review of Systems  Constitutional: Positive for unexpected weight change.  HENT: Negative.   Eyes: Negative.   Respiratory: Negative.   Cardiovascular: Negative.   Gastrointestinal: Positive for nausea.  Genitourinary: Negative.   Musculoskeletal: Negative.   Skin: Negative.   Neurological: Negative.   Hematological: Negative.   Psychiatric/Behavioral: Negative.        Objective:   Physical Exam  Constitutional: She is oriented to person, place, and time. She appears well-developed and well-nourished.  Musculoskeletal:       Right hip: She exhibits tenderness.        Tenderness over the greater trochanter but normal hip range of motion no pain with hip range of motion point tenderness over the greater trochanter  Neurological: She is alert and oriented to person, place, and time. She has normal strength. Gait normal.          Assessment & Plan:   1 lumbar postlaminectomy syndrome with chronic  postoperative pain. Today she is complaining of hip pain that is consistent with trochanteric bursitis on examination. Andrey Campanile inject her today.  in terms of her chronic pain syndrome she has tried various narcotic analgesics with limited success she's had for over sedation with 50 mcg of fentanyl. With 25 mcg she is relatively well controlled but she feels that the patch may not be a good option for her since she likes to go to the beach a lot during the summer. She like to do without this she has done well with Cymbalta in the past I have no problems with starting her on this. She'll be on 30 mg. She is taking a small dose of tramadol which I don't think will significantly interact. We'll see her back in one month and go up to 60 mg if she has insufficient response. She can wean her fentanyl by leaving the last patch on for 4 days rather than 3 days

## 2012-02-19 ENCOUNTER — Other Ambulatory Visit (HOSPITAL_COMMUNITY): Payer: Self-pay | Admitting: Neurosurgery

## 2012-02-19 ENCOUNTER — Other Ambulatory Visit: Payer: Self-pay | Admitting: Neurosurgery

## 2012-02-19 DIAGNOSIS — M545 Low back pain, unspecified: Secondary | ICD-10-CM

## 2012-02-27 ENCOUNTER — Encounter (HOSPITAL_COMMUNITY): Payer: Self-pay | Admitting: Pharmacy Technician

## 2012-03-04 ENCOUNTER — Telehealth: Payer: Self-pay | Admitting: *Deleted

## 2012-03-04 NOTE — Telephone Encounter (Signed)
Pt aware that xrays are normal.

## 2012-03-05 ENCOUNTER — Ambulatory Visit: Payer: Medicare Other | Admitting: Physical Medicine & Rehabilitation

## 2012-03-08 ENCOUNTER — Ambulatory Visit (HOSPITAL_COMMUNITY)
Admission: RE | Admit: 2012-03-08 | Discharge: 2012-03-08 | Disposition: A | Discharge status: Home / Self Care | Payer: Medicare Other | Source: Ambulatory Visit | Attending: Neurosurgery | Admitting: Neurosurgery

## 2012-03-08 ENCOUNTER — Ambulatory Visit: Payer: Medicare Other | Admitting: Physical Medicine & Rehabilitation

## 2012-03-08 DIAGNOSIS — M545 Low back pain: Secondary | ICD-10-CM

## 2012-03-08 DIAGNOSIS — M79609 Pain in unspecified limb: Secondary | ICD-10-CM | POA: Insufficient documentation

## 2012-03-08 DIAGNOSIS — Z981 Arthrodesis status: Secondary | ICD-10-CM | POA: Insufficient documentation

## 2012-03-08 DIAGNOSIS — M549 Dorsalgia, unspecified: Secondary | ICD-10-CM | POA: Insufficient documentation

## 2012-03-08 DIAGNOSIS — M48061 Spinal stenosis, lumbar region without neurogenic claudication: Secondary | ICD-10-CM | POA: Insufficient documentation

## 2012-03-08 MED ORDER — DIAZEPAM 5 MG PO TABS
ORAL_TABLET | ORAL | Status: AC
  Administered 2012-03-08: 10 mg via ORAL
  Filled 2012-03-08: qty 2

## 2012-03-08 MED ORDER — IOHEXOL 180 MG/ML  SOLN
20.0000 mL | Freq: Once | INTRAMUSCULAR | Status: AC | PRN
  Administered 2012-03-08: 16 mL via INTRATHECAL

## 2012-03-08 MED ORDER — KETOROLAC TROMETHAMINE 60 MG/2ML IM SOLN
INTRAMUSCULAR | Status: AC
  Filled 2012-03-08: qty 2

## 2012-03-08 MED ORDER — OXYCODONE-ACETAMINOPHEN 5-325 MG PO TABS
2.0000 | ORAL_TABLET | ORAL | Status: DC | PRN
  Administered 2012-03-08: 2 via ORAL

## 2012-03-08 MED ORDER — OXYCODONE-ACETAMINOPHEN 5-325 MG PO TABS
ORAL_TABLET | ORAL | Status: AC
  Filled 2012-03-08: qty 2

## 2012-03-08 MED ORDER — KETOROLAC TROMETHAMINE 60 MG/2ML IM SOLN
60.0000 mg | Freq: Once | INTRAMUSCULAR | Status: AC
  Administered 2012-03-08: 60 mg via INTRAMUSCULAR

## 2012-03-08 MED ORDER — DIAZEPAM 5 MG PO TABS
10.0000 mg | ORAL_TABLET | Freq: Once | ORAL | Status: AC
  Administered 2012-03-08: 10 mg via ORAL

## 2012-03-08 NOTE — H&P (Signed)
Subjective: The patient has had chronic back pain and several back surgeries. She has failed medical management. I've discussed working up further with a lumbar mild CT. The patient wants to proceed with that study.   Past Medical History  Diagnosis Date  . Essential hypertension, benign   . Thyroid disease   . Hyperlipidemia   . GERD (gastroesophageal reflux disease)   . Palpitations   . Chronic pain syndrome   . Insomnia   . TIA (transient ischemic attack)   . Anxiety   . Restless leg syndrome   . Degenerative disc disease   . Degenerative joint disease   . Osteoarthritis     Past Surgical History  Procedure Date  . Abdominal hysterectomy   . Cataract extraction, bilateral   . Lumbar spine surgery     x 3  . Appendectomy     Allergies  Allergen Reactions  . Erythromycin     REACTION: Swelling  . Latex     REACTION: Blisters  . Morphine Sulfate     REACTION: Halucinations/deep sleep  . Clozapine Rash  . Fentanyl Nausea Only    "Feels like you are a hundred miles away when talking to her"    History  Substance Use Topics  . Smoking status: Current Everyday Smoker -- 1.0 packs/day for 30 years    Types: Cigarettes  . Smokeless tobacco: Not on file  . Alcohol Use: 0.5 oz/week    1 drink(s) per week    Family History  Problem Relation Age of Onset  . Hypertension Mother   . Hypertension Father   . Heart attack Father    Prior to Admission medications   Medication Sig Start Date End Date Taking? Authorizing Provider  alendronate (FOSAMAX) 70 MG tablet Take 70 mg by mouth every 7 (seven) days. Saturday 05/05/11  Yes Historical Provider, MD  amitriptyline (ELAVIL) 100 MG tablet Take 100 mg by mouth at bedtime.   Yes Historical Provider, MD  aspirin EC 81 MG tablet Take 81 mg by mouth daily.   Yes Historical Provider, MD  baclofen (LIORESAL) 10 MG tablet Take 10 mg by mouth at bedtime as needed. For muscle spasms 12/27/11  Yes Historical Provider, MD  calcium  carbonate (OS-CAL) 600 MG TABS Take 1,200 mg by mouth 2 (two) times daily with a meal.    Yes Historical Provider, MD  DULoxetine (CYMBALTA) 30 MG capsule Take 30 mg by mouth daily.   Yes Historical Provider, MD  estradiol (ESTRACE) 1 MG tablet Take 1 mg by mouth daily.     Yes Historical Provider, MD  gabapentin (NEURONTIN) 800 MG tablet Take 800 mg by mouth 3 (three) times daily as needed. For pain   Yes Historical Provider, MD  hydrochlorothiazide 25 MG tablet Take 25 mg by mouth daily.     Yes Historical Provider, MD  levothyroxine (SYNTHROID, LEVOTHROID) 112 MCG tablet Take 112 mcg by mouth daily.     Yes Historical Provider, MD  meloxicam (MOBIC) 15 MG tablet Take 15 mg by mouth daily.  12/07/11  Yes Historical Provider, MD  methocarbamol (ROBAXIN) 750 MG tablet Take 750 mg by mouth 4 (four) times daily. As needed for muscle spasms    Yes Historical Provider, MD  Omega-3 Fatty Acids (FISH OIL) 1000 MG CAPS Take 1,000 mg by mouth 1 day or 1 dose.     Yes Historical Provider, MD  omeprazole (PRILOSEC) 20 MG capsule Take 20 mg by mouth daily.   Yes Historical Provider,  MD  pravastatin (PRAVACHOL) 20 MG tablet Take 20 mg by mouth daily.     Yes Historical Provider, MD  temazepam (RESTORIL) 30 MG capsule Take 30 mg by mouth at bedtime as needed. For sleep   Yes Historical Provider, MD  traMADol (ULTRAM) 50 MG tablet Take 50 mg by mouth 3 (three) times daily as needed. For pain 11/30/11  Yes Historical Provider, MD     Review of Systems  Positive ROS: As above  All other systems have been reviewed and were otherwise negative with the exception of those mentioned in the HPI and as above.  Objective: Vital signs in last 24 hours: Temp:  [97 F (36.1 C)] 97 F (36.1 C) (05/24 0623) Pulse Rate:  [72] 72  (05/24 0623) Resp:  [18] 18  (05/24 0623) BP: (134)/(85) 134/85 mmHg (05/24 0623) SpO2:  [98 %] 98 % (05/24 0623) Weight:  [71.668 kg (158 lb)] 71.668 kg (158 lb) (05/24 1610)  General  Appearance: Alert, cooperative, no distress, appears stated age Head: Normocephalic, without obvious abnormality, atraumatic Eyes: PERRL, conjunctiva/corneas clear, EOM's intact, fundi benign, both eyes      Ears: Normal TM's and external ear canals, both ears Throat: Lips, mucosa, and tongue normal; teeth and gums normal Neck: Supple, symmetrical, trachea midline, no adenopathy; thyroid: No enlargement/tenderness/nodules; no carotid bruit or JVD Back: Symmetric, no curvature, ROM normal, no CVA tenderness her lumbar incision is well-healed. Lungs: Clear to auscultation bilaterally, respirations unlabored Heart: Regular rate and rhythm, S1 and S2 normal, no murmur, rub or gallop Abdomen: Soft, non-tender, bowel sounds active all four quadrants, no masses, no organomegaly Extremities: Extremities normal, atraumatic, no cyanosis or edema Pulses: 2+ and symmetric all extremities Skin: Skin color, texture, turgor normal, no rashes or lesions  NEUROLOGIC:   Mental status: alert and oriented, no aphasia, good attention span, Fund of knowledge/ memory ok Motor Exam - grossly normal Sensory Exam - grossly normal Reflexes:  Coordination - grossly normal Gait - grossly normal Balance - grossly normal Cranial Nerves: I: smell Not tested  II: visual acuity  OS: Normal    OD: Normal   II: visual fields Full to confrontation  II: pupils Equal, round, reactive to light  III,VII: ptosis None  III,IV,VI: extraocular muscles  Full ROM  V: mastication Normal  V: facial light touch sensation  Normal  V,VII: corneal reflex  Present  VII: facial muscle function - upper  Normal  VII: facial muscle function - lower Normal  VIII: hearing Not tested  IX: soft palate elevation  Normal  IX,X: gag reflex Present  XI: trapezius strength  5/5  XI: sternocleidomastoid strength 5/5  XI: neck flexion strength  5/5  XII: tongue strength  Normal    Data Review Lab Results  Component Value Date   WBC 4.2  03/23/2009   HGB 14.0 03/23/2009   HCT 40.6 03/23/2009   MCV 90.3 03/23/2009   PLT 249 03/23/2009   Lab Results  Component Value Date   NA 142 03/23/2009   K 3.7 03/23/2009   CL 109 03/23/2009   CO2 24 03/23/2009   BUN 11 03/23/2009   CREATININE 0.73 03/23/2009   GLUCOSE 121* 03/23/2009   Lab Results  Component Value Date   INR 1.0 03/23/2009    Assessment/Plan: Lumbago, lumbar degenerative disease, lumbar radiculopathy: I discussed situation with the patient. We discussed a myelogram as well as the risks, benefits, alternatives. She has decided proceed with that study.   Tressie Stalker D 03/08/2012 7:45  AM

## 2012-03-08 NOTE — Discharge Instructions (Signed)
Myelogram and Lumbar Puncture Discharge Instructions  1. Go home and rest quietly for the next 24 hours.  It is important to lie flat for the next 24 hours.  Get up only to go to the restroom.  You may lie in the bed or on a couch on your back, your stomach, your left side or your right side.  You may have one pillow under your head.  You may have pillows between your knees while you are on your side or under your knees while you are on your back.  2. DO NOT drive today.  Recline the seat as far back as it will go, while still wearing your seat belt, on the way home.  3. You may get up to go to the bathroom as needed.  You may sit up for 10 minutes to eat.  You may resume your normal diet and medications unless otherwise indicated.  4. The incidence of headache, nausea, or vomiting is about 5% (one in 20 patients).  If you develop a headache, lie flat and drink plenty of fluids until the headache goes away.  Caffeinated beverages may be helpful.  If you develop severe nausea and vomiting or a headache that does not go away with flat bed rest, call MD.  5. You may resume normal activities after your 24 hours of bed rest is over; however, do not exert yourself strongly or do any heavy lifting tomorrow.  6. Call your physician for a follow-up appointment.  The results of your myelogram will be sent directly to your physician by the following day.  7. If you have any questions or if complications develop after you arrive home, please call MD.  Discharge instructions have been explained to the patient.  The patient, or the person responsible for the patient, fully understands these instructions.   

## 2012-03-08 NOTE — Op Note (Signed)
Brief history: The patient complains of back pain.  Procedure: Lumbar myelogram  Surgeon: Dr. Jeff Wylee Ogden  Asst.: None  Level injected:l5/s1  Dye injected: 15 cc of Omnipaque 180  CSF appearance: Clear  Estimated blood loss minimal  Complications: None   

## 2012-04-19 ENCOUNTER — Other Ambulatory Visit: Payer: Self-pay | Admitting: Physical Medicine & Rehabilitation

## 2012-05-06 ENCOUNTER — Other Ambulatory Visit: Payer: Self-pay

## 2012-05-06 MED ORDER — GABAPENTIN 800 MG PO TABS: 800.0000 mg | ORAL_TABLET | Freq: Three times a day (TID) | ORAL | Status: DC

## 2012-06-19 ENCOUNTER — Telehealth: Payer: Self-pay | Admitting: Physical Medicine & Rehabilitation

## 2012-06-19 NOTE — Telephone Encounter (Signed)
Left ,essage asking Emily Leonard to call back. Unsure of what type of procedure she is talking about.

## 2012-06-19 NOTE — Telephone Encounter (Signed)
Wants to see about Dr about a procedure involving Mobic(?)

## 2012-06-21 NOTE — Telephone Encounter (Signed)
3rd attempt to reach Mrs Bihl; unsuccessful. Encounter closed.

## 2012-06-21 NOTE — Telephone Encounter (Signed)
2nd call to reach Quincey. Her husband answered and said she had just left. Gave # [phone] to try around 9:30 and she should be there.

## 2012-07-01 ENCOUNTER — Ambulatory Visit: Payer: Medicare Other | Admitting: Physical Medicine & Rehabilitation

## 2012-07-01 DIAGNOSIS — F17200 Nicotine dependence, unspecified, uncomplicated: Secondary | ICD-10-CM | POA: Insufficient documentation

## 2012-07-01 DIAGNOSIS — Z8673 Personal history of transient ischemic attack (TIA), and cerebral infarction without residual deficits: Secondary | ICD-10-CM | POA: Insufficient documentation

## 2012-07-01 DIAGNOSIS — I10 Essential (primary) hypertension: Secondary | ICD-10-CM | POA: Insufficient documentation

## 2012-07-01 DIAGNOSIS — K219 Gastro-esophageal reflux disease without esophagitis: Secondary | ICD-10-CM | POA: Insufficient documentation

## 2012-07-01 DIAGNOSIS — M961 Postlaminectomy syndrome, not elsewhere classified: Secondary | ICD-10-CM | POA: Insufficient documentation

## 2012-07-01 DIAGNOSIS — M76899 Other specified enthesopathies of unspecified lower limb, excluding foot: Secondary | ICD-10-CM | POA: Insufficient documentation

## 2012-07-01 DIAGNOSIS — E785 Hyperlipidemia, unspecified: Secondary | ICD-10-CM | POA: Insufficient documentation

## 2012-07-05 ENCOUNTER — Encounter: Payer: Medicare Other | Attending: Physical Medicine & Rehabilitation

## 2012-07-05 ENCOUNTER — Encounter: Payer: Self-pay | Admitting: Physical Medicine & Rehabilitation

## 2012-07-05 ENCOUNTER — Ambulatory Visit (HOSPITAL_BASED_OUTPATIENT_CLINIC_OR_DEPARTMENT_OTHER): Payer: Medicare Other | Admitting: Physical Medicine & Rehabilitation

## 2012-07-05 ENCOUNTER — Ambulatory Visit: Payer: Medicare Other | Admitting: Physical Medicine & Rehabilitation

## 2012-07-05 VITALS — BP 119/68 | HR 72 | Resp 16 | Ht 64.0 in | Wt 162.0 lb

## 2012-07-05 DIAGNOSIS — M961 Postlaminectomy syndrome, not elsewhere classified: Secondary | ICD-10-CM

## 2012-07-05 NOTE — Progress Notes (Signed)
Subjective:    Patient ID: Emily Leonard, female    DOB: [birth year], 66 y.o.   MRN: [id number] Chief complaint: I hurt all over HPI Patient with all over body pain but states that the hips seem to be the worst. She has pain when she tries to lay on her side at night. She also has pain when she arises from sitting position. We reviewed her medications. These are prescribed by her primary care physician. She reports taking both Mobic and Celebrex. I instructed patient to stop taking the Mobic. She is taking oxycodone 30 mg IR prescribed by her primary physician and also takes tramadol. I instructed her to stop the tramadol Pain Inventory Average Pain 8 Pain Right Now 8 My pain is dull  In the last 24 hours, has pain interfered with the following? General activity 2 Relation with others 2 Enjoyment of life 2 What TIME of day is your pain at its worst? morning Sleep (in general) Fair  Pain is worse with: unsure Pain improves with: medication Relief from Meds: 5  Mobility how many minutes can you walk? 20 ability to climb steps?  yes do you drive?  yes  Function I need assistance with the following:  household duties and shopping  Neuro/Psych bladder control problems loss of taste or smell  Prior Studies Any changes since last visit?  no  Physicians involved in your care Any changes since last visit?  no   Family History  Problem Relation Age of Onset  . Hypertension Mother   . Hypertension Father   . Heart attack Father    History   Social History  . Marital Status: Married    Spouse Name: N/A    Number of Children: N/A  . Years of Education: N/A   Social History Main Topics  . Smoking status: Current Every Day Smoker -- 1.0 packs/day for 30 years    Types: Cigarettes  . Smokeless tobacco: None  . Alcohol Use: 0.5 oz/week    1 drink(s) per week  . Drug Use: No  . Sexually Active:    Other Topics Concern  . None   Social History Narrative  . None    Past Surgical History  Procedure Date  . Abdominal hysterectomy   . Cataract extraction, bilateral   . Lumbar spine surgery     x 3  . Appendectomy    Past Medical History  Diagnosis Date  . Essential hypertension, benign   . Thyroid disease   . Hyperlipidemia   . GERD (gastroesophageal reflux disease)   . Palpitations   . Chronic pain syndrome   . Insomnia   . TIA (transient ischemic attack)   . Anxiety   . Restless leg syndrome   . Degenerative disc disease   . Degenerative joint disease   . Osteoarthritis    BP 119/68  Pulse 72  Resp 16  Ht 5\' 4"  (1.626 m)  Wt 162 lb (73.483 kg)  BMI 27.81 kg/m2  SpO2 90%      Review of Systems  Constitutional: Negative.   HENT: Negative.   Eyes: Negative.   Respiratory: Negative.   Cardiovascular: Negative.   Gastrointestinal: Negative.   Genitourinary: Positive for difficulty urinating.  Musculoskeletal: Positive for back pain.  Skin: Negative.   Neurological: Negative.   Hematological: Negative.   Psychiatric/Behavioral: Negative.        Objective:   Physical Exam  Constitutional: She appears well-developed and well-nourished.  HENT:  Head: Normocephalic and atraumatic.  Musculoskeletal:       Right hip: She exhibits bony tenderness.       Left hip: She exhibits bony tenderness.       Lumbar back: She exhibits decreased range of motion.       Pain over greater trochanter both hips  Neurological: She has normal strength. No sensory deficit. Gait normal.  Psychiatric: Judgment and thought content normal. Her mood appears anxious. Her speech is delayed. Cognition and memory are impaired. She is inattentive.          Assessment & Plan:  1. Lumbar postlaminectomy syndromePrimary care physician to prescribe pain medications. I've advised her to stop her Mobic as well as her tramadol since she has other medications that she is taking  2. Bilateral trochanteric bursitis  Trochanteric bursa  injection Bilateral hips marked and prepped with Betadine injured with a 21-gauge 2 inch needle 2.5 cc of solution  1 cc of 40 more per cc Depo-Medrol and 4 cc of 1% lidocaine were injectedOn each side The patient tolerated procedure well. Postprocedure instructions given Repeat in 3 months if pain recurs

## 2012-07-05 NOTE — Patient Instructions (Addendum)

## 2012-07-06 ENCOUNTER — Other Ambulatory Visit: Payer: Self-pay | Admitting: Physical Medicine & Rehabilitation

## 2012-08-02 DIAGNOSIS — M25559 Pain in unspecified hip: Secondary | ICD-10-CM | POA: Insufficient documentation

## 2012-08-02 DIAGNOSIS — M545 Low back pain, unspecified: Secondary | ICD-10-CM | POA: Insufficient documentation

## 2012-08-10 ENCOUNTER — Other Ambulatory Visit: Payer: Self-pay | Admitting: Physical Medicine & Rehabilitation

## 2012-10-03 ENCOUNTER — Encounter: Payer: Self-pay | Admitting: Physical Medicine & Rehabilitation

## 2012-10-03 ENCOUNTER — Encounter: Payer: Medicare Other | Attending: Physical Medicine & Rehabilitation

## 2012-10-03 ENCOUNTER — Ambulatory Visit (HOSPITAL_BASED_OUTPATIENT_CLINIC_OR_DEPARTMENT_OTHER): Payer: Medicare Other | Admitting: Physical Medicine & Rehabilitation

## 2012-10-03 VITALS — BP 140/94 | HR 71 | Resp 14 | Ht 64.0 in | Wt 163.8 lb

## 2012-10-03 DIAGNOSIS — M25571 Pain in right ankle and joints of right foot: Secondary | ICD-10-CM

## 2012-10-03 DIAGNOSIS — K219 Gastro-esophageal reflux disease without esophagitis: Secondary | ICD-10-CM | POA: Insufficient documentation

## 2012-10-03 DIAGNOSIS — Z8673 Personal history of transient ischemic attack (TIA), and cerebral infarction without residual deficits: Secondary | ICD-10-CM | POA: Insufficient documentation

## 2012-10-03 DIAGNOSIS — M961 Postlaminectomy syndrome, not elsewhere classified: Secondary | ICD-10-CM

## 2012-10-03 DIAGNOSIS — M25579 Pain in unspecified ankle and joints of unspecified foot: Secondary | ICD-10-CM

## 2012-10-03 DIAGNOSIS — F17200 Nicotine dependence, unspecified, uncomplicated: Secondary | ICD-10-CM | POA: Insufficient documentation

## 2012-10-03 DIAGNOSIS — E785 Hyperlipidemia, unspecified: Secondary | ICD-10-CM | POA: Insufficient documentation

## 2012-10-03 DIAGNOSIS — I10 Essential (primary) hypertension: Secondary | ICD-10-CM | POA: Insufficient documentation

## 2012-10-03 DIAGNOSIS — M76899 Other specified enthesopathies of unspecified lower limb, excluding foot: Secondary | ICD-10-CM | POA: Insufficient documentation

## 2012-10-03 MED ORDER — GABAPENTIN 800 MG PO TABS: 800.0000 mg | ORAL_TABLET | Freq: Two times a day (BID) | ORAL | Status: DC

## 2012-10-03 MED ORDER — DICLOFENAC SODIUM 1 % TD GEL: 2.0000 g | Freq: Four times a day (QID) | TRANSDERMAL | Status: DC

## 2012-10-03 MED ORDER — TRAMADOL HCL 50 MG PO TABS: 50.0000 mg | ORAL_TABLET | Freq: Three times a day (TID) | ORAL | Status: DC | PRN

## 2012-10-03 NOTE — Progress Notes (Signed)
Subjective:    Patient ID: Emily Leonard, female    DOB: November 17, 1945, 66 y.o.   MRN: [id number]  HPI R ankle feels weak no falls No trauma Recurrent ankle sprains as a young adult Pain Inventory Average Pain 7 Pain Right Now 5 My pain is sharp and stabbing  In the last 24 hours, has pain interfered with the following? General activity 5 Relation with others 5 Enjoyment of life 8 What TIME of day is your pain at its worst? morning Sleep (in general) Fair  Pain is worse with:  Pain improves with: medication and  Relief from Meds: 5  Mobility how many minutes can you walk? 30 ability to climb steps?  yes do you drive?  yes  Function retired I need assistance with the following:  household duties and shopping  Neuro/Psych trouble walking  Prior Studies Any changes since last visit?  no Clinical Data: Low back pain. Right leg pain  CT MYELOGRAPHY LUMBAR SPINE  Technique: CT imaging of the lumbar spine was performed after  intrathecal contrast administration. Multiplanar CT image  reconstructions were also generated.  Comparison: Lumbar MRI 12/12/2011  Findings: Normal lumbar alignment. Negative for fracture or mass  lesion. Conus medullaris is normal and terminates at L1.  T12-L1: Mild disc bulging.  L1-2: Broad-based disc bulging. Mild facet and ligamentum flavum  hypertrophy with mild spinal stenosis.  L2-3: Trans pedicle fusion with posterior decompression. Hardware  position is satisfactory. Solid interbody bone fusion with spacer  and bone graft.  L3-4: Trans pedicle fusion with posterior decompression. No  spinal stenosis. Solid interbody fusion.  L4-5: Trans pedicle fusion with posterior decompression. No  significant stenosis. Bone graft is present in the disc space  however there is not definite bony union and this may represent a  pseudoarthrosis. Foramina are patent.  L5 S1: Mild disc degeneration disc bulging without stenosis.  IMPRESSION:  Mild spinal  stenosis L1-2 related to disc bulging and posterior  element hypertrophy.  Trans pedicle and interbody fusion L2-L5. No significant stenosis.  Possible pseudoarthrosis at L4-5.  Physicians involved in your care Any changes since last visit?  no   Family History  Problem Relation Age of Onset  . Hypertension Mother   . Hypertension Father   . Heart attack Father    History   Social History  . Marital Status: Married    Spouse Name: N/A    Number of Children: N/A  . Years of Education: N/A   Social History Main Topics  . Smoking status: Current Every Day Smoker -- 1.0 packs/day for 30 years    Types: Cigarettes  . Smokeless tobacco: None  . Alcohol Use: 0.5 oz/week    1 drink(s) per week  . Drug Use: No  . Sexually Active:    Other Topics Concern  . None   Social History Narrative  . None   Past Surgical History  Procedure Date  . Abdominal hysterectomy   . Cataract extraction, bilateral   . Lumbar spine surgery     x 3  . Appendectomy    Past Medical History  Diagnosis Date  . Essential hypertension, benign   . Thyroid disease   . Hyperlipidemia   . GERD (gastroesophageal reflux disease)   . Palpitations   . Chronic pain syndrome   . Insomnia   . TIA (transient ischemic attack)   . Anxiety   . Restless leg syndrome   . Degenerative disc disease   . Degenerative joint disease   .  Osteoarthritis    BP 140/94  Pulse 71  Resp 14  Ht 5\' 4"  (1.626 m)  Wt 163 lb 12.8 oz (74.299 kg)  BMI 28.12 kg/m2  SpO2 94%    Review of Systems  Constitutional: Positive for unexpected weight change.  Musculoskeletal: Positive for back pain and gait problem.  All other systems reviewed and are negative.       Objective:   Physical Exam  Nursing note and vitals reviewed. Constitutional: She is oriented to person, place, and time. She appears well-developed and well-nourished.  HENT:  Head: Normocephalic and atraumatic.  Musculoskeletal:       Right ankle:  She exhibits normal range of motion, no swelling and no ecchymosis. tenderness. Achilles tendon normal.       Lumbar back: She exhibits decreased range of motion and deformity. She exhibits no tenderness, no edema and no pain.       Flat back  Tenderness R medial talar dome No ligamentous instability  -SLR  Neurological: She is alert and oriented to person, place, and time. She has normal strength. No sensory deficit. Gait normal.  Reflex Scores:      Patellar reflexes are 2+ on the right side and 2+ on the left side.      Achilles reflexes are 2+ on the right side and 2+ on the left side. Psychiatric: She has a normal mood and affect.          Assessment & Plan:  1. Lumbar postlaminectomy syndrome, Now off of oxycodone. Pain is stable on tramadol and gabapentin. Her gabapentin dose is 800 mcg. Tramadol dose is 50 mg. She takes 2-3 tablets of the tramadol per day and mainly 2 tablets a day on the gabapentin 2. History of trochanteric bursitis improved no need for injection today Return to clinic in 6 months 3. 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 No need for imaging studies. Will try diclofenac gel

## 2013-02-13 HISTORY — PX: COLONOSCOPY: SHX174

## 2013-02-14 DIAGNOSIS — F419 Anxiety disorder, unspecified: Secondary | ICD-10-CM | POA: Insufficient documentation

## 2013-02-14 DIAGNOSIS — K219 Gastro-esophageal reflux disease without esophagitis: Secondary | ICD-10-CM | POA: Insufficient documentation

## 2013-02-14 DIAGNOSIS — E785 Hyperlipidemia, unspecified: Secondary | ICD-10-CM | POA: Insufficient documentation

## 2013-02-14 DIAGNOSIS — E039 Hypothyroidism, unspecified: Secondary | ICD-10-CM | POA: Insufficient documentation

## 2013-02-14 DIAGNOSIS — K635 Polyp of colon: Secondary | ICD-10-CM | POA: Insufficient documentation

## 2013-02-14 DIAGNOSIS — G609 Hereditary and idiopathic neuropathy, unspecified: Secondary | ICD-10-CM | POA: Insufficient documentation

## 2013-02-14 DIAGNOSIS — F17200 Nicotine dependence, unspecified, uncomplicated: Secondary | ICD-10-CM | POA: Insufficient documentation

## 2013-02-14 DIAGNOSIS — G894 Chronic pain syndrome: Secondary | ICD-10-CM | POA: Insufficient documentation

## 2013-02-14 DIAGNOSIS — M5136 Other intervertebral disc degeneration, lumbar region: Secondary | ICD-10-CM | POA: Insufficient documentation

## 2013-02-18 ENCOUNTER — Telehealth: Payer: Self-pay

## 2013-02-18 MED ORDER — DULOXETINE HCL 30 MG PO CPEP: 30.0000 mg | ORAL_CAPSULE | Freq: Every day | ORAL | Status: DC

## 2013-02-18 NOTE — Telephone Encounter (Signed)
Patient request cymbalta refill to [facility].  This was sent to the pharmacy.

## 2013-03-16 HISTORY — PX: HEMORRHOID SURGERY: SHX153

## 2013-04-01 ENCOUNTER — Encounter: Payer: Self-pay | Admitting: Physical Medicine & Rehabilitation

## 2013-04-01 ENCOUNTER — Encounter: Payer: Medicare Other | Attending: Physical Medicine & Rehabilitation

## 2013-04-01 ENCOUNTER — Ambulatory Visit (HOSPITAL_BASED_OUTPATIENT_CLINIC_OR_DEPARTMENT_OTHER): Payer: Medicare Other | Admitting: Physical Medicine & Rehabilitation

## 2013-04-01 VITALS — BP 127/57 | HR 69 | Resp 16 | Ht 64.0 in | Wt 164.0 lb

## 2013-04-01 DIAGNOSIS — R209 Unspecified disturbances of skin sensation: Secondary | ICD-10-CM

## 2013-04-01 DIAGNOSIS — Z79899 Other long term (current) drug therapy: Secondary | ICD-10-CM | POA: Insufficient documentation

## 2013-04-01 DIAGNOSIS — M961 Postlaminectomy syndrome, not elsewhere classified: Secondary | ICD-10-CM

## 2013-04-01 MED ORDER — TRAMADOL HCL 50 MG PO TABS: 50.0000 mg | ORAL_TABLET | Freq: Three times a day (TID) | ORAL | Status: DC | PRN

## 2013-04-01 MED ORDER — GABAPENTIN 800 MG PO TABS: 800.0000 mg | ORAL_TABLET | Freq: Two times a day (BID) | ORAL | Status: DC

## 2013-04-01 NOTE — Patient Instructions (Signed)
Please followup with your surgeon for your hemorrhoid pain and itching  I will see you in 6 months  If your right arm burning pain or left big toe burning pain increase call me and I will order a scan

## 2013-04-01 NOTE — Progress Notes (Signed)
Subjective:    Patient ID: Emily Leonard, female    DOB: 1946-04-10, 67 y.o.   MRN: [id number]  HPI Right arm feels like it's on fire. 1 one month duration. No fall, never had anything like that before . Sensation is intermittent. Not influenced by neck position or arm position. No new bowel or bladder dysfunction  Also has intermittent left great toe numbness and burning pain  Hemmorhoidectomy 2 weeks ago.  Pain Inventory Average Pain 6 Pain Right Now 6 My pain is dull and aching  In the last 24 hours, has pain interfered with the following? General activity na Relation with others na Enjoyment of life na What TIME of day is your pain at its worst? night Sleep (in general) Fair  Pain is worse with: na Pain improves with: na Relief from Meds: 6  Mobility how many minutes can you walk? 30 Do you have any goals in this area?  no  Function retired I need assistance with the following:  household duties  Neuro/Psych No problems in this area  Prior Studies Any changes since last visit?  no  Physicians involved in your care Any changes since last visit?  no   Family History  Problem Relation Age of Onset  . Hypertension Mother   . Hypertension Father   . Heart attack Father    History   Social History  . Marital Status: Married    Spouse Name: N/A    Number of Children: N/A  . Years of Education: N/A   Social History Main Topics  . Smoking status: Current Every Day Smoker -- 1.00 packs/day for 30 years    Types: Cigarettes  . Smokeless tobacco: None  . Alcohol Use: 0.5 oz/week    1 drink(s) per week  . Drug Use: No  . Sexually Active:    Other Topics Concern  . None   Social History Narrative  . None   Past Surgical History  Procedure Laterality Date  . Abdominal hysterectomy    . Cataract extraction, bilateral    . Lumbar spine surgery      x 3  . Appendectomy    . Hemorrhoid surgery  03/2013  . Colonoscopy  02/2013   Past Medical History   Diagnosis Date  . Essential hypertension, benign   . Thyroid disease   . Hyperlipidemia   . GERD (gastroesophageal reflux disease)   . Palpitations   . Chronic pain syndrome   . Insomnia   . TIA (transient ischemic attack)   . Anxiety   . Restless leg syndrome   . Degenerative disc disease   . Degenerative joint disease   . Osteoarthritis    BP 127/57  Pulse 69  Resp 16  Ht 5\' 4"  (1.626 m)  Wt 164 lb (74.39 kg)  BMI 28.14 kg/m2  SpO2 94%     Review of Systems  All other systems reviewed and are negative.       Objective:   Physical Exam  Nursing note and vitals reviewed. Constitutional: She is oriented to person, place, and time.  Musculoskeletal:       Lumbar back: She exhibits decreased range of motion. She exhibits no tenderness.  Negative straight leg raising test bilateral Negative Spurling maneuver    Neurological: She is alert and oriented to person, place, and time. She has normal strength. She displays no tremor. A sensory deficit is present. Coordination and gait normal.  Reflex Scores:      Tricep reflexes  are 2+ on the right side and 2+ on the left side.      Bicep reflexes are 2+ on the right side and 2+ on the left side.      Brachioradialis reflexes are 2+ on the right side and 2+ on the left side.      Patellar reflexes are 1+ on the right side and 1+ on the left side.      Achilles reflexes are 1+ on the right side and 1+ on the left side. Left L5 sensory deficit No upper extremity sensory deficits          Assessment & Plan:  1. Lumbar postlaminectomy syndrome, Now off of oxycodone. Pain is stable on tramadol and gabapentin. Her gabapentin dose is 800 mcg. Tramadol dose is 50 mg. She takes 2-3 tablets of the tramadol per day and mainly 2 tablets a day on the gabapentin 2. History of trochanteric bursitis improved no need for injection today Return to clinic in 6 months If the right arm or left great toe discomfort becomes more constant  she will contact the office for another appointment and will likely need imaging studies

## 2013-05-08 ENCOUNTER — Ambulatory Visit (HOSPITAL_BASED_OUTPATIENT_CLINIC_OR_DEPARTMENT_OTHER): Payer: Medicare Other | Admitting: Physical Medicine & Rehabilitation

## 2013-05-08 ENCOUNTER — Encounter: Payer: Self-pay | Admitting: Physical Medicine & Rehabilitation

## 2013-05-08 ENCOUNTER — Encounter: Payer: Medicare Other | Attending: Physical Medicine & Rehabilitation

## 2013-05-08 VITALS — BP 123/72 | HR 66 | Resp 14 | Ht 64.0 in | Wt 164.0 lb

## 2013-05-08 DIAGNOSIS — M76899 Other specified enthesopathies of unspecified lower limb, excluding foot: Secondary | ICD-10-CM

## 2013-05-08 DIAGNOSIS — G8929 Other chronic pain: Secondary | ICD-10-CM

## 2013-05-08 DIAGNOSIS — Z79899 Other long term (current) drug therapy: Secondary | ICD-10-CM | POA: Insufficient documentation

## 2013-05-08 DIAGNOSIS — M7062 Trochanteric bursitis, left hip: Secondary | ICD-10-CM

## 2013-05-08 DIAGNOSIS — M961 Postlaminectomy syndrome, not elsewhere classified: Secondary | ICD-10-CM

## 2013-05-08 DIAGNOSIS — M25579 Pain in unspecified ankle and joints of unspecified foot: Secondary | ICD-10-CM

## 2013-05-08 NOTE — Patient Instructions (Signed)
Ankle x-rays ordered Will review these. If ankle pain persists, will need to do ultrasound guided ankle injection

## 2013-05-08 NOTE — Progress Notes (Signed)
Subjective:    Patient ID: Emily Leonard, female    DOB: 09-Dec-1945, 67 y.o.   MRN: [id number]  HPI  Pain Inventory Average Pain 7 Pain Right Now 7 My pain is sharp and dull  In the last 24 hours, has pain interfered with the following? General activity 5 Relation with others 10 Enjoyment of life 10 What TIME of day is your pain at its worst? constant Sleep (in general) Fair  Pain is worse with: walking, bending, sitting, inactivity and standing Pain improves with: pacing activities and medication Relief from Meds: 7  Mobility how many minutes can you walk? 30 ability to climb steps?  yes do you drive?  yes Do you have any goals in this area?  yes  Function Do you have any goals in this area?  no  Neuro/Psych trouble walking  Prior Studies Any changes since last visit?  no  Physicians involved in your care Any changes since last visit?  no   Family History  Problem Relation Age of Onset  . Hypertension Mother   . Hypertension Father   . Heart attack Father    History   Social History  . Marital Status: Married    Spouse Name: N/A    Number of Children: N/A  . Years of Education: N/A   Social History Main Topics  . Smoking status: Current Every Day Smoker -- 1.00 packs/day for 30 years    Types: Cigarettes  . Smokeless tobacco: None  . Alcohol Use: 0.5 oz/week    1 drink(s) per week  . Drug Use: No  . Sexually Active:    Other Topics Concern  . None   Social History Narrative  . None   Past Surgical History  Procedure Laterality Date  . Abdominal hysterectomy    . Cataract extraction, bilateral    . Lumbar spine surgery      x 3  . Appendectomy    . Hemorrhoid surgery  03/2013  . Colonoscopy  02/2013   Past Medical History  Diagnosis Date  . Essential hypertension, benign   . Thyroid disease   . Hyperlipidemia   . GERD (gastroesophageal reflux disease)   . Palpitations   . Chronic pain syndrome   . Insomnia   . TIA (transient  ischemic attack)   . Anxiety   . Restless leg syndrome   . Degenerative disc disease   . Degenerative joint disease   . Osteoarthritis    BP 123/72  Pulse 66  Resp 14  Ht 5\' 4"  (1.626 m)  Wt 164 lb (74.39 kg)  BMI 28.14 kg/m2  SpO2 98%     Review of Systems  Musculoskeletal: Positive for back pain and gait problem.  All other systems reviewed and are negative.       Objective:   Physical Exam  Tenderness to palpation over bilateral greater  trochanter area of the hip Pain with lateral decubitus positioning Pain to palpation at the area just lateral to the tibialis EHL tendon, at the joint line     Assessment & Plan:  1. Lumbar postlaminectomy syndromePrimary care physician to prescribe pain medications. I've advised her to stop her Mobic as well as her tramadol since she has other medications that she is taking  2. Bilateral trochanteric bursitis  Trochanteric bursa injection Bilateral hips marked and prepped with Betadine injured with a 25-gauge 1.5 inch needle 5 cc of solution  1 cc of 40 more per cc Depo-Medrol and 4 cc of  1% lidocaine were injectedOn each side The patient tolerated procedure well. Postprocedure instructions given Repeat in 3 months if pain recurs 3. Ankle pain this appears to be in the joint. Will order x-rays. May benefit from ankle injection under ultrasound guidance

## 2013-06-24 ENCOUNTER — Other Ambulatory Visit: Payer: Self-pay

## 2013-06-24 MED ORDER — TRAMADOL HCL 50 MG PO TABS: 50.0000 mg | ORAL_TABLET | Freq: Three times a day (TID) | ORAL | Status: DC | PRN

## 2013-08-07 ENCOUNTER — Encounter: Payer: Medicare Other | Attending: Physical Medicine & Rehabilitation

## 2013-08-07 ENCOUNTER — Ambulatory Visit (HOSPITAL_BASED_OUTPATIENT_CLINIC_OR_DEPARTMENT_OTHER): Payer: Medicare Other | Admitting: Physical Medicine & Rehabilitation

## 2013-08-07 ENCOUNTER — Encounter: Payer: Self-pay | Admitting: Physical Medicine & Rehabilitation

## 2013-08-07 VITALS — BP 131/81 | HR 75 | Resp 14 | Ht 64.0 in | Wt 162.8 lb

## 2013-08-07 DIAGNOSIS — Z79899 Other long term (current) drug therapy: Secondary | ICD-10-CM | POA: Insufficient documentation

## 2013-08-07 DIAGNOSIS — M961 Postlaminectomy syndrome, not elsewhere classified: Secondary | ICD-10-CM | POA: Insufficient documentation

## 2013-08-07 DIAGNOSIS — M76899 Other specified enthesopathies of unspecified lower limb, excluding foot: Secondary | ICD-10-CM

## 2013-08-07 DIAGNOSIS — M7061 Trochanteric bursitis, right hip: Secondary | ICD-10-CM | POA: Insufficient documentation

## 2013-08-07 NOTE — Patient Instructions (Signed)

## 2013-08-07 NOTE — Progress Notes (Signed)
  Subjective:    Patient ID: Emily Leonard, female    DOB: Nov 14, 1945, 67 y.o.   MRN: [id number]  HPI Unable to sleep on her side. The pain wakes her up in the hip area every night. Last set of injections was helpful for several weeks. She would like to repeat. Last injection was 3 months ago Pain Inventory Average Pain 5 Pain Right Now 5 My pain is dull and stabbing  In the last 24 hours, has pain interfered with the following? General activity 4 Relation with others 7 Enjoyment of life 8 What TIME of day is your pain at its worst? morning and evening Sleep (in general) Fair  Pain is worse with: some activites Pain improves with: TENS and injections Relief from Meds: 6  Mobility walk without assistance how many minutes can you walk? 30 ability to climb steps?  yes do you drive?  yes  Function retired I need assistance with the following:  household duties and shopping  Neuro/Psych No problems in this area  Prior Studies Any changes since last visit?  no  Physicians involved in your care Any changes since last visit?  no   Family History  Problem Relation Age of Onset  . Hypertension Mother   . Hypertension Father   . Heart attack Father    History   Social History  . Marital Status: Married    Spouse Name: N/A    Number of Children: N/A  . Years of Education: N/A   Social History Main Topics  . Smoking status: Current Every Day Smoker -- 1.00 packs/day for 30 years    Types: Cigarettes  . Smokeless tobacco: Never Used  . Alcohol Use: 0.5 oz/week    1 drink(s) per week  . Drug Use: No  . Sexual Activity: None   Other Topics Concern  . None   Social History Narrative  . None   Past Surgical History  Procedure Laterality Date  . Abdominal hysterectomy    . Cataract extraction, bilateral    . Lumbar spine surgery      x 3  . Appendectomy    . Hemorrhoid surgery  03/2013  . Colonoscopy  02/2013   Past Medical History  Diagnosis Date  .  Essential hypertension, benign   . Thyroid disease   . Hyperlipidemia   . GERD (gastroesophageal reflux disease)   . Palpitations   . Chronic pain syndrome   . Insomnia   . TIA (transient ischemic attack)   . Anxiety   . Restless leg syndrome   . Degenerative disc disease   . Degenerative joint disease   . Osteoarthritis    BP 131/81  Pulse 75  Resp 14  Ht 5\' 4"  (1.626 m)  Wt 162 lb 12.8 oz (73.846 kg)  BMI 27.93 kg/m2  SpO2 95%    Review of Systems  All other systems reviewed and are negative.       Objective:   Physical Exam        Assessment & Plan:  Bilateral trochanteric bursitis  Trochanteric bursa injection  Bilateral hips marked and prepped with Betadine injured with a 25-gauge 1.5 inch needle 5 cc of solution 1 cc of 6mg  per cc betamethasone and 4 cc of 1% lidocaine were injectedOn each side  The patient tolerated procedure well.  Postprocedure instructions given  Repeat in 3 months if pain recurs

## 2013-09-29 ENCOUNTER — Ambulatory Visit: Payer: Medicare Other | Admitting: Physical Medicine & Rehabilitation

## 2013-10-28 ENCOUNTER — Ambulatory Visit (HOSPITAL_BASED_OUTPATIENT_CLINIC_OR_DEPARTMENT_OTHER): Payer: Medicare Other | Admitting: Physical Medicine & Rehabilitation

## 2013-10-28 ENCOUNTER — Encounter: Payer: Self-pay | Admitting: Physical Medicine & Rehabilitation

## 2013-10-28 ENCOUNTER — Encounter: Payer: Medicare Other | Attending: Physical Medicine & Rehabilitation

## 2013-10-28 VITALS — BP 135/82 | HR 70 | Resp 14 | Ht 64.0 in | Wt 164.0 lb

## 2013-10-28 DIAGNOSIS — M19249 Secondary osteoarthritis, unspecified hand: Secondary | ICD-10-CM

## 2013-10-28 DIAGNOSIS — M961 Postlaminectomy syndrome, not elsewhere classified: Secondary | ICD-10-CM

## 2013-10-28 DIAGNOSIS — R209 Unspecified disturbances of skin sensation: Secondary | ICD-10-CM

## 2013-10-28 DIAGNOSIS — Z79899 Other long term (current) drug therapy: Secondary | ICD-10-CM | POA: Insufficient documentation

## 2013-10-28 MED ORDER — GABAPENTIN 600 MG PO TABS: 600.0000 mg | ORAL_TABLET | Freq: Three times a day (TID) | ORAL | Status: DC

## 2013-10-28 NOTE — Patient Instructions (Signed)
Talk to family MD about drowsiness.  Don't think it is related to Tramadol, could be related to robaxin,amitriptyline, or gabapentin.  I adjusted dose of gabapentin May need to adjust other medication doses

## 2013-10-28 NOTE — Progress Notes (Signed)
Originally scheduled for bilateral hip injections under ultrasound guidance however hip pain is not an issue.  The patient has to pain complaints today. The right index finger has pain. No history of trauma. She wonders if it may be gout but the patient does not have a history of gout.  In addition the patient complains of pain in the left great toe. More specifically this is on the plantar aspect of the toe and it feels raw  Limited review of systems:  No significant back pain her neck pain today. No bowel or bladder complaint   Examination: No acute distress Mood and affect are appropriate Right hand has no evidence of pain to palpation in the DIPs or PIPs. The patient does have had very dense nodules right DIP index finger. Grip strength is good bilaterally in the upper extremities Left foot has reduced sensation to light touch as well as pinprick on the plantar surface of the toe as well as medial foot. Intact sensation on the lateral surface Negative Tinel sign over the medial malleoli are area and tarsal tunnel area. Negative straight leg raise Deep tendon reflexes are reduced in bilateral knees and ankles  Impression: 1. Right index finger pain this appears to be secondary to osteoarthritis no acute inflammation. Recommend diclofenac gel to that area 4 times per day  2. Left great toe pain has history of lumbar radiculopathy as well as lumbar fusion surgery from L3-S1. Suspect that this is radicular. She has been on chronic gabapentin but may need to increase dosage.  3. Drowsiness this is multifactorial maybe secondary to medications. Is on multiple sedating medications. I would suspect gabapentin or Robaxin or amitriptyline I will change her gabapentin dose to 600 mg but take 3 times a day Talk to primary M.D. regarding Robaxin or amitriptyline dose adjustment

## 2013-10-29 ENCOUNTER — Encounter: Payer: Self-pay | Admitting: Internal Medicine

## 2014-01-07 ENCOUNTER — Other Ambulatory Visit: Payer: Self-pay | Admitting: Physical Medicine & Rehabilitation

## 2014-01-09 ENCOUNTER — Telehealth: Payer: Self-pay

## 2014-01-09 NOTE — Telephone Encounter (Signed)
Patient called requesting tramadol refill.  Please advise.

## 2014-01-09 NOTE — Telephone Encounter (Signed)
May reill tramadol 50mg #90 with 5 refills

## 2014-01-12 MED ORDER — TRAMADOL HCL 50 MG PO TABS: 50.0000 mg | ORAL_TABLET | Freq: Three times a day (TID) | ORAL | Status: DC | PRN

## 2014-01-12 NOTE — Telephone Encounter (Signed)
Refill phoned in at [facility]. Left a message with patient's husband to inform her that refill was sent to pharmacy.

## 2014-01-28 ENCOUNTER — Telehealth: Payer: Self-pay

## 2014-01-28 NOTE — Telephone Encounter (Signed)
Patient called complaining of burning and needles in her feet.  She would like a back injection for this.  Please advise as to what type of injection.

## 2014-01-29 NOTE — Telephone Encounter (Signed)
Bilateral S1 transforaminal

## 2014-01-29 NOTE — Telephone Encounter (Signed)
Contacted patient to schedule a  Bilateral S1 transforaminal appt is 03/12/14.

## 2014-02-23 ENCOUNTER — Telehealth: Payer: Self-pay

## 2014-02-23 NOTE — Telephone Encounter (Signed)
Pharmacist called to make you aware that patient was prescribed Oxycodone on 4/23 by another provider. He wanted to make sure it was okay to fill the Tramadol RX. Please advise.

## 2014-02-23 NOTE — Telephone Encounter (Signed)
Contacted pharmacy to inform pharmacist per Dr. Letta Pate not to fill patient's Tramadol if she was receiving Oxycodone. Pharmacist states he would inform patient.

## 2014-02-23 NOTE — Telephone Encounter (Signed)
Do not fill tramadol if pt receiving oxycodone

## 2014-02-24 ENCOUNTER — Telehealth: Payer: Self-pay

## 2014-02-24 NOTE — Telephone Encounter (Signed)
Patient was requesting tramadol refill.  Informed her she has refills left and she needs to contact pharmacy.  Patient will do this.

## 2014-02-24 NOTE — Telephone Encounter (Signed)
Tried to contact patient regarding medication request, unable to leave message.

## 2014-02-24 NOTE — Telephone Encounter (Signed)
Contacted pharmacy to see why patient was not able to fill her Tramadol. Pharmacist states the patient received Oxycodone on 02/05/14 from her PCP, and she would need her PCP to call the pharmacy. The pharmacy also said that the PCP would need to prescribe the Tramadol. Contacted patient to inform her. Patient is going to contact her PCP.

## 2014-02-24 NOTE — Telephone Encounter (Signed)
Patient called back and stated that the pharmacy needs the okay to fill the Tramadol.

## 2014-02-24 NOTE — Telephone Encounter (Signed)
Patient is requesting a refill on her medications. Patient did not give the names of the medications.

## 2014-03-12 ENCOUNTER — Ambulatory Visit (HOSPITAL_BASED_OUTPATIENT_CLINIC_OR_DEPARTMENT_OTHER): Payer: Medicare Other | Admitting: Physical Medicine & Rehabilitation

## 2014-03-12 ENCOUNTER — Encounter: Payer: Self-pay | Admitting: Physical Medicine & Rehabilitation

## 2014-03-12 ENCOUNTER — Encounter: Payer: Medicare Other | Attending: Physical Medicine & Rehabilitation

## 2014-03-12 VITALS — BP 143/77 | HR 67 | Resp 14 | Ht 64.0 in | Wt 160.0 lb

## 2014-03-12 DIAGNOSIS — IMO0002 Reserved for concepts with insufficient information to code with codable children: Secondary | ICD-10-CM

## 2014-03-12 NOTE — Patient Instructions (Signed)

## 2014-03-12 NOTE — Progress Notes (Signed)
Bilateral S1 foramen epidural steroid injection under fluoroscopic guidance  Indication: Lumbosacral radiculitis is not relieved by medication management or other conservative care and interfering with self-care and mobility.  Informed consent was obtained after describing risk and benefits of the procedure with the patient, this includes bleeding, bruising, infection, paralysis and medication side effects.  The patient wishes to proceed and has given written consent.  Patient was placed in a prone position.  The lumbar area was marked and prepped with Betadine.  It was entered with a 25-gauge 1-1/2 inch needle and one mL of 1% lidocaine was injected into the skin and subcutaneous tissue.  Then a 22-gauge 3.5 inch  spinal needle was guided into the left S1 foramen under AP and Lateral imaging.  Once needle tip of approximated the posterior elements, a loss of resistance technique was utilized with lateral imaging.  A positive loss of resistance was obtained and then confirmed by injecting 2 mL's of Omnipaque 180.  Then a solution containing 56ml of 10 mg per cc dexamethasone and 1.5 mL's of 1% lidocaine was injected. This same procedure was repeated on the right side at S1 using the same needle and technique The patient tolerated procedure well.  Post procedure instructions were given.  Please see post procedure form.  Toe pain improved post procedure Back pain with minimal improvement post procedure

## 2014-03-12 NOTE — Progress Notes (Signed)
  PROCEDURE RECORD [facility]   Name: PHALA SCHRAEDER DOB:1945-12-22 MRN: [id number]  Date:03/12/2014  Physician: Alysia Penna, MD    Nurse/CMA: Mckyle Solanki CMA  Allergies:  Allergies  Allergen Reactions  . Erythromycin     REACTION: Swelling  . Latex     REACTION: Blisters  . Morphine Sulfate     REACTION: Halucinations/deep sleep  . Clozapine Rash  . Fentanyl Nausea Only    "Feels like you are a hundred miles away when talking to her"    Consent Signed: yes  Is patient diabetic? no  CBG today? na  Pregnant: no LMP: No LMP recorded. Patient has had a hysterectomy. (age 45-55)  Anticoagulants: no Anti-inflammatory: no Antibiotics: no  Procedure: Bilateral S1 transforaminal Epidural Steroid Injection Position: Prone Start Time: 10:39 End Time: 10:49 Fluoro Time: 27 seconds  RN/CMA Tauheedah Bok CMA Makisha Marrin CMA    Time 10:04 10:52    BP 143/77 133/63    Pulse 67 65    Respirations 14 14    O2 Sat 95 95    S/S 6 6    Pain Level 8/10 7/10     D/C home with husband, [name], D/C instructions reviewed, and sits independently.

## 2014-03-18 ENCOUNTER — Telehealth: Payer: Self-pay

## 2014-03-18 NOTE — Telephone Encounter (Signed)
Patient states she has been having headaches and lack of concentration for a couple days. Patient was wondering if this could be a side effect of the ESI she had last week. Per Emily Leonard informed patient that those symptoms would not be a side effect of the ESI, and she may need to F/U with her PCP. Patient verbalized understanding.

## 2014-03-19 ENCOUNTER — Encounter: Payer: Self-pay | Admitting: Cardiology

## 2014-03-24 ENCOUNTER — Encounter: Payer: Self-pay | Admitting: Internal Medicine

## 2014-04-28 ENCOUNTER — Encounter: Payer: Medicare Other | Attending: Physical Medicine & Rehabilitation

## 2014-04-28 ENCOUNTER — Ambulatory Visit (HOSPITAL_BASED_OUTPATIENT_CLINIC_OR_DEPARTMENT_OTHER): Payer: Medicare Other | Admitting: Physical Medicine & Rehabilitation

## 2014-04-28 ENCOUNTER — Encounter: Payer: Self-pay | Admitting: Physical Medicine & Rehabilitation

## 2014-04-28 VITALS — BP 136/90 | HR 77 | Resp 14 | Ht 64.0 in | Wt 162.0 lb

## 2014-04-28 DIAGNOSIS — IMO0002 Reserved for concepts with insufficient information to code with codable children: Secondary | ICD-10-CM | POA: Insufficient documentation

## 2014-04-28 DIAGNOSIS — M5416 Radiculopathy, lumbar region: Secondary | ICD-10-CM

## 2014-04-28 DIAGNOSIS — M961 Postlaminectomy syndrome, not elsewhere classified: Secondary | ICD-10-CM

## 2014-04-28 NOTE — Progress Notes (Signed)
  PROCEDURE RECORD [facility]   Name: Emily Leonard DOB:[birth year] MRN: [id number]  Date:04/28/2014  Physician: Alysia Penna, MD    Nurse/CMA: Chiyoko Torrico CMA  Allergies:  Allergies  Allergen Reactions  . Erythromycin     REACTION: Swelling  . Latex     REACTION: Blisters  . Morphine Sulfate     REACTION: Halucinations/deep sleep  . Clozapine Rash  . Fentanyl Nausea Only    "Feels like you are a hundred miles away when talking to her"    Consent Signed: Yes.    Is patient diabetic? No.  CBG today?   Pregnant: No. LMP: No LMP recorded. Patient has had a hysterectomy. (age 88-55)  Anticoagulants: no Anti-inflammatory: no Antibiotics: no  Procedure: Caudal Epidural Steroid Injection Position: Prone Start Time: 2:37 End Time:2:44      Fluoro Time:26 seconds   RN/CMA Tevon Berhane CMA Shernita Rabinovich CMA    Time 1:32 2:47    BP 136/90 148/66    Pulse 77 72    Respirations 14 14    O2 Sat 98 97    S/S 6 6    Pain Level 7/10 4/10     D/C home with husband, [name], D/C instructions reviewed, and sits independently.

## 2014-04-28 NOTE — Progress Notes (Signed)
Caudal epidural injection Informed consent was obtained after describing risks and benefits of the procedure the patient is include bleeding bruising and infection she elects to proceed and has given written consent. Patient placed prone on fluoroscopy table Betadine prep Sterile drape 25-gauge 1.5 inch needle was used to anesthetize the skin and subcutaneous tissue after identifying the sacral hiatus on fluoroscopic AP views Then a 22-gauge 3.5 inch needle was inserted into the sacral hiatus. Lateral views were utilized to maneuver the needle into the sacral canal Under AP views 3 cc of Omnipaque 180 injected Then a solution containing 12 mg of Celestone and 3 cc of 1% NP of lidocaine were injected. Patient tolerated procedure well Post procedure instructions given  Preinjection 7/10 Post injection 4/10

## 2014-04-28 NOTE — Patient Instructions (Addendum)
You received an epidural steroid injection under fluoroscopic guidance. This is the most accurate way to perform an epidural injection. This injection was performed to relieve thigh or leg or foot pain that may be related to a pinched nerve in the lumbar spine. The local anesthetic injected today may cause numbness in your leg for a couple hours. If it is severe we may need to observe you for 30-60 minutes after the injection. The cortisone medicine injected today may take several days to take full effect. This medicine can also cause facial flushing or feeling of being warm.  This injection may last for days weeks or months. It can be repeated if needed. If it is not effective, another spinal level may need to be injected. Other treatments include medication management as well as physical therapy. In some cases surgery may be an option.  Preinjection

## 2014-05-07 ENCOUNTER — Encounter: Payer: Self-pay | Admitting: *Deleted

## 2014-05-21 ENCOUNTER — Telehealth: Payer: Self-pay | Admitting: Cardiology

## 2014-05-21 NOTE — Telephone Encounter (Signed)
6 pages of records received from [facility] on 8.6.15: Given to [facility] in HIM for the patient's appointment on 8.7.15 with Dr. Hochrein:djc

## 2014-05-22 ENCOUNTER — Ambulatory Visit (INDEPENDENT_AMBULATORY_CARE_PROVIDER_SITE_OTHER): Payer: Medicare Other | Admitting: Cardiology

## 2014-05-22 ENCOUNTER — Encounter (HOSPITAL_COMMUNITY): Payer: Self-pay | Admitting: *Deleted

## 2014-05-22 ENCOUNTER — Encounter: Payer: Self-pay | Admitting: Cardiology

## 2014-05-22 VITALS — BP 152/94 | HR 62 | Ht 64.0 in | Wt 162.6 lb

## 2014-05-22 DIAGNOSIS — R002 Palpitations: Secondary | ICD-10-CM

## 2014-05-22 DIAGNOSIS — R072 Precordial pain: Secondary | ICD-10-CM

## 2014-05-22 MED ORDER — DILTIAZEM HCL 30 MG PO TABS: 30.0000 mg | ORAL_TABLET | Freq: Every day | ORAL | Status: DC

## 2014-05-22 NOTE — Patient Instructions (Signed)
Your physician recommends that you schedule a follow-up appointment in:  One year with Dr Percival Spanish  We are ordering a stress test  Start taking Cardizem 30 mg at night

## 2014-05-22 NOTE — Progress Notes (Signed)
HPI The patient presents for evaluation of chest discomfort and palpitations. She has had palpitations off and on for some time. She describes a brief isolated fluttering that will happen mostly at night but rarely during the day. She does not get shortness of breath, PND or orthopnea or other symptoms with this but it is distressing to her. She cannot make it happen. She does do some activities such as vacuuming which wears her out. However, she is limited by chronic back pain. She has had some chest heaviness. This happens sporadically and not necessarily with activities. She does not describe associated symptoms such as nausea vomiting or diaphoresis. She's not had any new shortness of breath, PND or orthopnea. She has had no weight gain or edema.  Allergies  Allergen Reactions  . Erythromycin     REACTION: Swelling  . Latex     REACTION: Blisters  . Morphine Sulfate     REACTION: Halucinations/deep sleep  . Clozapine Rash  . Fentanyl Nausea Only    "Feels like you are a hundred miles away when talking to her"    Current Outpatient Prescriptions  Medication Sig Dispense Refill  . alendronate (FOSAMAX) 70 MG tablet Take 70 mg by mouth every 7 (seven) days. Saturday      . amitriptyline (ELAVIL) 100 MG tablet Take 100 mg by mouth at bedtime.      Marland Kitchen aspirin EC 81 MG tablet Take 81 mg by mouth daily.      . baclofen (LIORESAL) 10 MG tablet Take 10 mg by mouth at bedtime as needed. For muscle spasms      . calcium carbonate (OS-CAL) 600 MG TABS Take 1,200 mg by mouth 2 (two) times daily with a meal.       . diclofenac sodium (VOLTAREN) 1 % GEL Apply 2 g topically 4 (four) times daily.  3 Tube  1  . DULoxetine (CYMBALTA) 30 MG capsule Take 1 capsule (30 mg total) by mouth daily.  30 capsule  1  . estradiol (ESTRACE) 1 MG tablet Take 1 mg by mouth daily.        Marland Kitchen gabapentin (NEURONTIN) 600 MG tablet Take 1 tablet (600 mg total) by mouth 3 (three) times daily. For pain  90 tablet  5  .  GAVILYTE-N WITH FLAVOR PACK 420 G solution       . hydrochlorothiazide 25 MG tablet Take 25 mg by mouth daily.        Marland Kitchen levothyroxine (SYNTHROID, LEVOTHROID) 112 MCG tablet Take 112 mcg by mouth daily.        . meloxicam (MOBIC) 15 MG tablet Take 15 mg by mouth daily.       . methocarbamol (ROBAXIN) 750 MG tablet Take 750 mg by mouth 4 (four) times daily. As needed for muscle spasms       . nabumetone (RELAFEN) 500 MG tablet       . Omega-3 Fatty Acids (FISH OIL) 1000 MG CAPS Take 1,000 mg by mouth 1 day or 1 dose.        Marland Kitchen omeprazole (PRILOSEC) 40 MG capsule       . pravastatin (PRAVACHOL) 20 MG tablet Take 20 mg by mouth daily.        Marland Kitchen PROCTOSOL HC 2.5 % rectal cream       . temazepam (RESTORIL) 30 MG capsule Take 30 mg by mouth at bedtime as needed. For sleep      . traMADol (ULTRAM) 50 MG tablet Take 1  tablet (50 mg total) by mouth every 8 (eight) hours as needed. Must last 30 days  90 tablet  5   No current facility-administered medications for this visit.    Past Medical History  Diagnosis Date  . Essential hypertension, benign   . Thyroid disease   . Hyperlipidemia   . GERD (gastroesophageal reflux disease)   . Palpitations   . Chronic pain syndrome   . Insomnia   . TIA (transient ischemic attack)   . Anxiety   . Restless leg syndrome   . Degenerative disc disease   . Degenerative joint disease   . Osteoarthritis     Past Surgical History  Procedure Laterality Date  . Abdominal hysterectomy    . Cataract extraction, bilateral    . Lumbar spine surgery      x 3  . Appendectomy    . Hemorrhoid surgery  03/2013  . Colonoscopy  02/2013    Family History  Problem Relation Age of Onset  . Hypertension Mother   . Hypertension Father   . Heart attack Father     History   Social History  . Marital Status: Married    Spouse Name: N/A    Number of Children: N/A  . Years of Education: N/A   Occupational History  . Not on file.   Social History Main Topics  .  Smoking status: Current Every Day Smoker -- 1.00 packs/day for 30 years    Types: Cigarettes  . Smokeless tobacco: Never Used  . Alcohol Use: 0.5 oz/week    1 drink(s) per week  . Drug Use: No  . Sexual Activity: Not on file   Other Topics Concern  . Not on file   Social History Narrative  . No narrative on file    ROS:  As stated in the HPI and negative for all other systems.   PHYSICAL EXAM BP 152/94  Pulse 62  Ht 5\' 4"  (1.626 m)  Wt 162 lb 9.6 oz (73.755 kg)  BMI 27.90 kg/m2 GENERAL:  Well appearing HEENT:  Pupils equal round and reactive, fundi not visualized, oral mucosa unremarkable NECK:  No jugular venous distention, waveform within normal limits, carotid upstroke brisk and symmetric, no bruits, no thyromegaly LYMPHATICS:  No cervical, inguinal adenopathy LUNGS:  Clear to auscultation bilaterally BACK:  No CVA tenderness CHEST:  Unremarkable HEART:  PMI not displaced or sustained,S1 and S2 within normal limits, no S3, no S4, no clicks, no rubs, no murmurs ABD:  Flat, positive bowel sounds normal in frequency in pitch, no bruits, no rebound, no guarding, no midline pulsatile mass, no hepatomegaly, no splenomegaly EXT:  2 plus pulses throughout, no edema, no cyanosis no clubbing SKIN:  No rashes no nodules NEURO:  Cranial nerves II through XII grossly intact, motor grossly intact throughout [facility]:  Cognitively intact, oriented to person place and time   EKG:  Sinus rhythm, rate 62, left axis deviation, poor anterior R wave progression, no acute ST-T wave changes  05/22/2014   ASSESSMENT AND PLAN  CHEST PAIN:  She will need stress testing. However, because of her back pain she would not be a walk on a treadmill. Therefore, she will have a [employer].  PALPITATIONS:  Her symptoms symptoms are likely PACs or PVCs. Since she is only really having them at night I will have her take Cardizem 30 mg daily. I did review some labs recently drawn. She has normal  electrolytes. TSH was also within normal limits.  CAROTID  PLAQUE:  She did have some mild plaque on her recent community screening and we will followup on this in the future.  HTN:  Her blood pressure is controlled. She will continue the meds as listed.

## 2014-06-02 ENCOUNTER — Telehealth (HOSPITAL_COMMUNITY): Payer: Self-pay

## 2014-06-02 NOTE — Telephone Encounter (Signed)
Encounter complete. 

## 2014-06-03 ENCOUNTER — Telehealth (HOSPITAL_COMMUNITY): Payer: Self-pay

## 2014-06-03 NOTE — Telephone Encounter (Signed)
Encounter complete. 

## 2014-06-04 ENCOUNTER — Ambulatory Visit (HOSPITAL_COMMUNITY)
Admission: RE | Admit: 2014-06-04 | Discharge: 2014-06-04 | Disposition: A | Discharge status: Home / Self Care | Payer: Medicare Other | Source: Ambulatory Visit | Attending: Cardiology | Admitting: Cardiology

## 2014-06-04 DIAGNOSIS — R0609 Other forms of dyspnea: Secondary | ICD-10-CM | POA: Diagnosis not present

## 2014-06-04 DIAGNOSIS — F17200 Nicotine dependence, unspecified, uncomplicated: Secondary | ICD-10-CM | POA: Diagnosis not present

## 2014-06-04 DIAGNOSIS — I2510 Atherosclerotic heart disease of native coronary artery without angina pectoris: Secondary | ICD-10-CM | POA: Diagnosis present

## 2014-06-04 DIAGNOSIS — Z8249 Family history of ischemic heart disease and other diseases of the circulatory system: Secondary | ICD-10-CM | POA: Insufficient documentation

## 2014-06-04 DIAGNOSIS — I10 Essential (primary) hypertension: Secondary | ICD-10-CM | POA: Diagnosis not present

## 2014-06-04 DIAGNOSIS — R0989 Other specified symptoms and signs involving the circulatory and respiratory systems: Secondary | ICD-10-CM | POA: Insufficient documentation

## 2014-06-04 DIAGNOSIS — R079 Chest pain, unspecified: Secondary | ICD-10-CM

## 2014-06-04 DIAGNOSIS — R42 Dizziness and giddiness: Secondary | ICD-10-CM | POA: Insufficient documentation

## 2014-06-04 DIAGNOSIS — R002 Palpitations: Secondary | ICD-10-CM | POA: Insufficient documentation

## 2014-06-04 MED ORDER — TECHNETIUM TC 99M SESTAMIBI GENERIC - CARDIOLITE
30.3000 | Freq: Once | INTRAVENOUS | Status: AC | PRN
  Administered 2014-06-04: 30.3 via INTRAVENOUS

## 2014-06-04 MED ORDER — TECHNETIUM TC 99M SESTAMIBI GENERIC - CARDIOLITE
10.1000 | Freq: Once | INTRAVENOUS | Status: AC | PRN
  Administered 2014-06-04: 10.1 via INTRAVENOUS

## 2014-06-04 MED ORDER — AMINOPHYLLINE 25 MG/ML IV SOLN
75.0000 mg | Freq: Once | INTRAVENOUS | Status: AC
  Administered 2014-06-04: 75 mg via INTRAVENOUS

## 2014-06-04 MED ORDER — REGADENOSON 0.4 MG/5ML IV SOLN
0.4000 mg | Freq: Once | INTRAVENOUS | Status: AC
  Administered 2014-06-04: 0.4 mg via INTRAVENOUS

## 2014-06-04 NOTE — Procedures (Addendum)
[facility] [address] [place] [address] [phone]  Cardiology Nuclear Med Study  Emily Leonard is a 68 y.o. female     MRN : [id number]     DOB: Sep 04, 1946  Procedure Date: 06/04/2014  Nuclear Med Background Indication for Stress Test:  Evaluation for Ischemia History:  No prior cardiac or respiratory history reported;No prior NUC MPI for comparison. Cardiac Risk Factors: Carotid Disease, Family History - CAD, Hypertension, Lipids, Smoker and TIA  Symptoms:  Chest Pain, DOE, Light-Headedness and Palpitations   Nuclear Pre-Procedure Caffeine/Decaff Intake:  9:00pm NPO After: 7:00am   IV Site: R Forearm  IV 0.9% NS with Angio Cath:  22g  Chest Size (in):  n/a IV Started by: Rolene Course, RN  Height: 5\' 4"  (1.626 m)  Cup Size: B  BMI:  Body mass index is 27.79 kg/(m^2). Weight:  162 lb (73.483 kg)   Tech Comments:  n/a    Nuclear Med Study 1 or 2 day study: 1 day  Stress Test Type:  [place]  Order Authorizing Provider:  Minus Breeding, MD   Resting Radionuclide: Technetium 29m Sestamibi  Resting Radionuclide Dose: 10.1 mCi   Stress Radionuclide:  Technetium 36m Sestamibi  Stress Radionuclide Dose: 30.3 mCi           Stress Protocol Rest HR: 58 Stress HR:70  Rest BP: 122/84 Stress BP: 147/66  Exercise Time (min): n/a METS: n/a          Dose of Adenosine (mg):  n/a Dose of Lexiscan: 0.4 mg  Dose of Atropine (mg): n/a Dose of Dobutamine: n/a mcg/kg/min (at max HR)  Stress Test Technologist: Mellody Memos, CCT Nuclear Technologist: Imagene Riches, CNMT   Rest Procedure:  Myocardial perfusion imaging was performed at rest 45 minutes following the intravenous administration of Technetium 34m Sestamibi. Stress Procedure:  The patient received IV Lexiscan 0.4 mg over 15-seconds.  Technetium 82m Sestamibi injected at 30-seconds.  Patient experienced chest pressure, shortness of breath, stomach pains and was  administered 75 mg of Aminophylline IV at 5 minutes. There were no significant changes with Lexiscan.  Quantitative spect images were obtained after a 45 minute delay.  Transient Ischemic Dilatation (Normal <1.22):  0.99 QGS EDV:  76 ml QGS ESV:  19 ml LV Ejection Fraction: 74%  Rest ECG: NSR with non-specific ST-T wave changes  Stress ECG: No significant change from baseline ECG  QPS Raw Data Images:  Mild breast attenuation.  Normal left ventricular size. Stress Images:  There is decreased uptake in the anterior wall. Rest Images:  There is decreased uptake in the anterior wall. Subtraction (SDS):  There is a fixed anteriour defect that is most consistent with breast attenuation.  Impression Exercise Capacity:  Lexiscan with no exercise. BP Response:  Normal blood pressure response. Clinical Symptoms:  Dyspnea, mild chest pressure ECG Impression:  No significant ECG changes with Lexiscan. Comparison with Prior Nuclear Study: No previous nuclear study performed  Overall Impression:  Low risk stress nuclear study with small area of distal anterior breast attenuation artifact. No reversible ischemia.  LV Wall Motion:  NL LV Function; NL Wall Motion; EF 74%  [name], MD, [facility] Board Certified in Nuclear Cardiology Attending Cardiologist [place], [place]  06/04/2014 12:42 PM

## 2014-06-05 ENCOUNTER — Encounter: Payer: Self-pay | Admitting: Physical Medicine & Rehabilitation

## 2014-06-05 ENCOUNTER — Encounter: Payer: Medicare Other | Attending: Physical Medicine & Rehabilitation

## 2014-06-05 ENCOUNTER — Ambulatory Visit (HOSPITAL_BASED_OUTPATIENT_CLINIC_OR_DEPARTMENT_OTHER): Payer: Medicare Other | Admitting: Physical Medicine & Rehabilitation

## 2014-06-05 VITALS — BP 130/68 | HR 67 | Resp 14 | Ht 64.0 in | Wt 164.0 lb

## 2014-06-05 DIAGNOSIS — M961 Postlaminectomy syndrome, not elsewhere classified: Secondary | ICD-10-CM

## 2014-06-05 DIAGNOSIS — IMO0002 Reserved for concepts with insufficient information to code with codable children: Secondary | ICD-10-CM | POA: Diagnosis present

## 2014-06-05 DIAGNOSIS — M48062 Spinal stenosis, lumbar region with neurogenic claudication: Secondary | ICD-10-CM

## 2014-06-05 NOTE — Progress Notes (Signed)
Subjective:    Patient ID: Emily Leonard, female    DOB: [birth year], 68 y.o.   MRN: [id number]  HPI  Chief complaint is low back and foot pain.  Underwent caudal epidural injection 04/28/2014 pre-injection 7/10 pain post injection 4/10 pain, injection lasted about 4 days. Injection helped with back pain but not with foot pain. Did not help with cramping pain in the calf muscles  Pain Inventory Average Pain 7 Pain Right Now 7 My pain is sharp, burning and aching  In the last 24 hours, has pain interfered with the following? General activity 7 Relation with others 7 Enjoyment of life 9 What TIME of day is your pain at its worst? constant all day Sleep (in general) Fair  Pain is worse with: na Pain improves with: na Relief from Meds: 5  Mobility walk without assistance ability to climb steps?  yes do you drive?  yes transfers alone Do you have any goals in this area?  yes  Function retired  Neuro/Psych No problems in this area  Prior Studies Any changes since last visit?  yes CT [facility] LUMBAR SPINE May 24th 2013 Technique: CT imaging of the lumbar spine was performed after  intrathecal contrast administration. Multiplanar CT image  reconstructions were also generated.  Comparison: Lumbar MRI 12/12/2011  Findings: Normal lumbar alignment. Negative for fracture or mass  lesion. Conus medullaris is normal and terminates at L1.  T12-L1: Mild disc bulging.  L1-2: Broad-based disc bulging. Mild facet and ligamentum flavum  hypertrophy with mild spinal stenosis.  L2-3: Trans pedicle fusion with posterior decompression. Hardware  position is satisfactory. Solid interbody bone fusion with spacer  and bone graft.  L3-4: Trans pedicle fusion with posterior decompression. No  spinal stenosis. Solid interbody fusion.  L4-5: Trans pedicle fusion with posterior decompression. No  significant stenosis. Bone graft is present in the disc space  however there is not definite  bony union and this may represent a  pseudoarthrosis. Foramina are patent.  L5 S1: Mild disc degeneration disc bulging without stenosis.  IMPRESSION:  Mild spinal stenosis L1-2 related to disc bulging and posterior  element hypertrophy.  Trans pedicle and interbody fusion L2-L5. No significant stenosis.  Possible pseudoarthrosis at L4-5.  Physicians involved in your care Any changes since last visit?  yes   Family History  Problem Relation Age of Onset  . Hypertension Mother   . Hypertension Father   . Heart attack Father 52  . Heart attack Brother 29   History   Social History  . Marital Status: Married    Spouse Name: N/A    Number of Children: 4  . Years of Education: N/A   Social History Main Topics  . Smoking status: Current Every Day Smoker -- 1.00 packs/day for 30 years    Types: Cigarettes  . Smokeless tobacco: Never Used  . Alcohol Use: 0.5 oz/week    1 drink(s) per week  . Drug Use: No  . Sexual Activity: None   Other Topics Concern  . None   Social History Narrative  . None   Past Surgical History  Procedure Laterality Date  . Abdominal hysterectomy    . Cataract extraction, bilateral    . Lumbar spine surgery      x 3  . Appendectomy    . Hemorrhoid surgery  03/2013  . Colonoscopy  02/2013  . Cervical spine surgery     Past Medical History  Diagnosis Date  . Essential hypertension, benign   .  Thyroid disease   . Hyperlipidemia   . GERD (gastroesophageal reflux disease)   . Palpitations   . Chronic pain syndrome   . Insomnia   . TIA (transient ischemic attack)   . Anxiety   . Restless leg syndrome   . Degenerative disc disease   . Degenerative joint disease   . Osteoarthritis    BP 130/68  Pulse 67  Resp 14  Ht 5\' 4"  (1.626 m)  Wt 164 lb (74.39 kg)  BMI 28.14 kg/m2  SpO2 94%  Opioid Risk Score:   Fall Risk Score: Moderate Fall Risk (6-13 points) (pt educated, declined handout)    Review of Systems  Musculoskeletal: Positive  for back pain.  All other systems reviewed and are negative.      Objective:   Physical Exam   decreased sensation to pinprick bilateral L3 Decreased hip flexion left side Lumbar spine range of motion is 75% flexion , extension, lateral bending Straight leg raising is negative Lower extremities without swelling tenderness Motor strength has 3/5 left hip flexion 5/5 right hip flexion 5/5 bilateral knee extension 5/5 bottle ankle dorsiflexion plantar flexion Gait without evidence of toe drag or knee instability     Assessment & Plan:  1. Lumbar postlaminectomy syndrome status post L2-L5 fusion. I suspect she is developing degeneration above and below her fusion. She appears to have some neurogenic claudication like symptoms. I would recommend neuro surgical reevaluation. I will defer imaging studies to neurosurgery. Her last imaging study was a CT myelogram  RTC 3 month Family physician describing medications with exception of gabapentin. Recommend increasing gabapentin to 900 mg each bedtime continue 600 mg twice a day during the day

## 2014-06-05 NOTE — Patient Instructions (Signed)
Increased nighttime dose of gabapentin to 1-1/2 tablets

## 2014-06-18 ENCOUNTER — Ambulatory Visit: Payer: Medicare Other | Admitting: Physical Medicine & Rehabilitation

## 2014-06-18 ENCOUNTER — Encounter: Payer: Medicare Other | Attending: Physical Medicine & Rehabilitation

## 2014-06-18 DIAGNOSIS — IMO0002 Reserved for concepts with insufficient information to code with codable children: Secondary | ICD-10-CM | POA: Insufficient documentation

## 2014-08-11 ENCOUNTER — Telehealth: Payer: Self-pay | Admitting: *Deleted

## 2014-08-11 NOTE — Telephone Encounter (Signed)
Patient called clinical line and left message - she needs an appt for injections in her heels

## 2014-08-13 ENCOUNTER — Encounter: Payer: Medicare Other | Attending: Physical Medicine & Rehabilitation

## 2014-08-13 ENCOUNTER — Ambulatory Visit (HOSPITAL_BASED_OUTPATIENT_CLINIC_OR_DEPARTMENT_OTHER): Payer: Medicare Other | Admitting: Physical Medicine & Rehabilitation

## 2014-08-13 ENCOUNTER — Encounter: Payer: Self-pay | Admitting: Physical Medicine & Rehabilitation

## 2014-08-13 VITALS — BP 130/80 | HR 67 | Resp 14 | Ht 65.0 in | Wt 165.5 lb

## 2014-08-13 DIAGNOSIS — M5416 Radiculopathy, lumbar region: Secondary | ICD-10-CM | POA: Insufficient documentation

## 2014-08-13 NOTE — Progress Notes (Signed)
  PROCEDURE RECORD [facility]   Name: TRENIKA HUDSON DOB:19-May-1946 MRN: [id number]  Date:08/13/2014  Physician: Alysia Penna, MD    Nurse/CMA: Kip Cropp  Allergies:  Allergies  Allergen Reactions  . Erythromycin     REACTION: Swelling  . Latex     REACTION: Blisters  . Morphine Sulfate     REACTION: Halucinations/deep sleep  . Clozapine Rash  . Fentanyl Nausea Only    "Feels like you are a hundred miles away when talking to her"    Consent Signed: Yes.    Is patient diabetic? No.  CBG today? .   Pregnant: No. LMP: No LMP recorded. Patient has had a hysterectomy. (age [age])  Anticoagulants: no Anti-inflammatory: no Antibiotics: no  Procedure: Tranforaminal Epidural Steroid Injection  Position: Prone Start Time: 1:26 pm End Time: 1:30  Fluoro Time: 14 seconds  RN/CMA Kassey Laforest Pierce Barocio    Time 1:16 pm 1:34pm    BP 130/80 145/68    Pulse 76 68    Respirations 14 14    O2 Sat 94 96    S/S 6/6 6/6    Pain Level 8.5/10 4/10     D/C home with husband, [name], D/C instructions reviewed, and sits independently.

## 2014-08-13 NOTE — Progress Notes (Signed)
Subjective:    Patient ID: Emily Leonard, female    DOB: Mar 14, 1946, 67 y.o.   MRN: [id number]  HPI  Pain into left great toe starting in the back History of L 2 through L5 fusion   Pain Inventory Average Pain 6 Pain Right Now 8 My pain is constant, sharp and stabbing  In the last 24 hours, has pain interfered with the following? General activity 7 Relation with others 7 Enjoyment of life 7 What TIME of day is your pain at its worst? daytime and night Sleep (in general) Fair  Pain is worse with: some activites Pain improves with: rest and medication Relief from Meds: 6  Mobility ability to climb steps?  yes do you drive?  yes  Function retired  Neuro/Psych No problems in this area  Prior Studies Any changes since last visit?  no  Physicians involved in your care Any changes since last visit?  no   Family History  Problem Relation Age of Onset  . Hypertension Mother   . Hypertension Father   . Heart attack Father 7  . Heart attack Brother 45   History   Social History  . Marital Status: Married    Spouse Name: N/A    Number of Children: 4  . Years of Education: N/A   Social History Main Topics  . Smoking status: Current Every Day Smoker -- 1.00 packs/day for 30 years    Types: Cigarettes  . Smokeless tobacco: Never Used  . Alcohol Use: 0.5 oz/week    1 drink(s) per week  . Drug Use: No  . Sexual Activity: None   Other Topics Concern  . None   Social History Narrative  . None   Past Surgical History  Procedure Laterality Date  . Abdominal hysterectomy    . Cataract extraction, bilateral    . Lumbar spine surgery      x 3  . Appendectomy    . Hemorrhoid surgery  03/2013  . Colonoscopy  02/2013  . Cervical spine surgery     Past Medical History  Diagnosis Date  . Essential hypertension, benign   . Thyroid disease   . Hyperlipidemia   . GERD (gastroesophageal reflux disease)   . Palpitations   . Chronic pain syndrome   . Insomnia    . TIA (transient ischemic attack)   . Anxiety   . Restless leg syndrome   . Degenerative disc disease   . Degenerative joint disease   . Osteoarthritis    BP 130/80  Pulse 67  Resp 14  Ht 5\' 5"  (1.651 m)  Wt 165 lb 8 oz (75.07 kg)  BMI 27.54 kg/m2  SpO2 94%  Opioid Risk Score:   Fall Risk Score: Low Fall Risk (0-5 points)   Review of Systems     Objective:   Physical Exam        Assessment & Plan:    Lumbar Left L5 transforaminal epidural steroid injection under fluoroscopic guidance  Indication: Lumbosacral radiculitis is not relieved by medication management or other conservative care and interfering with self-care and mobility.   Informed consent was obtained after describing risk and benefits of the procedure with the patient, this includes bleeding, bruising, infection, paralysis and medication side effects.  The patient wishes to proceed and has given written consent.  Patient was placed in prone position.  The lumbar area was marked and prepped with Betadine.  It was entered with a 25-gauge 1-1/2 inch needle and one mL  of 1% lidocaine was injected into the skin and subcutaneous tissue.  Then a 22-gauge 3.5 in spinal needle was inserted into the Left L5-S1 intervertebral foramen under AP, lateral, and oblique view.  Then a solution containing one mL of 10 mg per mL dexamethasone and 2 mL of 1% lidocaine was injected.  The patient tolerated procedure well.  Post procedure instructions were given.  Please see post procedure form.

## 2014-08-13 NOTE — Patient Instructions (Signed)

## 2014-09-04 ENCOUNTER — Encounter: Payer: Self-pay | Admitting: Physical Medicine & Rehabilitation

## 2014-09-04 ENCOUNTER — Ambulatory Visit (HOSPITAL_BASED_OUTPATIENT_CLINIC_OR_DEPARTMENT_OTHER): Payer: Medicare Other | Admitting: Physical Medicine & Rehabilitation

## 2014-09-04 ENCOUNTER — Encounter: Payer: Medicare Other | Attending: Physical Medicine & Rehabilitation

## 2014-09-04 VITALS — BP 142/85 | HR 76 | Resp 14 | Wt 163.4 lb

## 2014-09-04 DIAGNOSIS — M7062 Trochanteric bursitis, left hip: Secondary | ICD-10-CM | POA: Insufficient documentation

## 2014-09-04 DIAGNOSIS — M7061 Trochanteric bursitis, right hip: Secondary | ICD-10-CM

## 2014-09-04 MED ORDER — TRAMADOL HCL 50 MG PO TABS: 50.0000 mg | ORAL_TABLET | Freq: Two times a day (BID) | ORAL | Status: DC

## 2014-09-04 MED ORDER — DULOXETINE HCL 30 MG PO CPEP: 30.0000 mg | ORAL_CAPSULE | Freq: Every day | ORAL | Status: DC

## 2014-09-04 NOTE — Patient Instructions (Signed)
I will prescribe your tramadol and Cymbalta, long-term goal is to reduce medications  You've had cortisone injection for hip bursitis today can repeat in 3 months

## 2014-09-04 NOTE — Progress Notes (Signed)
Trochanteric bursa injection- Bilateral procedure With  ultrasound guidance  Indication Trochanteric bursitis. Exam has tenderness over the greater trochanter of the hip. Pain has not responded to conservative care such as exercise therapy and oral medications. Pain interferes with sleep or with mobility Informed consent was obtained after describing risks and benefits of the procedure with the patient these include bleeding bruising and infection. Patient has signed written consent form. Patient placed in a lateral decubitus position with the affected hip superior.One percent lidocaine infiltrated with 25-gauge 1.5 inch needle. Then a 80 mm echo block needle was inserted under direct ultrasound visualization Point of maximal pain was palpated marked and prepped with Betadine and entered with a needle to bone contact. Needle slightly withdrawn then 6mg  of betamethasone with 4 cc 1% lidocaine were injected.This procedure was repeated on the opposite side Patient tolerated procedure well. Post procedure instructions given.

## 2014-09-07 ENCOUNTER — Ambulatory Visit: Payer: Medicare Other | Admitting: Physical Medicine & Rehabilitation

## 2014-12-03 ENCOUNTER — Encounter: Payer: Self-pay | Admitting: Physical Medicine & Rehabilitation

## 2014-12-03 ENCOUNTER — Encounter: Payer: Medicare Other | Attending: Physical Medicine & Rehabilitation

## 2014-12-03 ENCOUNTER — Ambulatory Visit (HOSPITAL_BASED_OUTPATIENT_CLINIC_OR_DEPARTMENT_OTHER): Payer: Medicare Other | Admitting: Physical Medicine & Rehabilitation

## 2014-12-03 VITALS — BP 140/80 | HR 78 | Resp 14

## 2014-12-03 DIAGNOSIS — M5416 Radiculopathy, lumbar region: Secondary | ICD-10-CM

## 2014-12-03 DIAGNOSIS — M5417 Radiculopathy, lumbosacral region: Secondary | ICD-10-CM | POA: Diagnosis not present

## 2014-12-03 NOTE — Patient Instructions (Signed)

## 2014-12-03 NOTE — Progress Notes (Signed)
Right S1 transforaminal epidural steroid injection under fluoroscopic guidance  Indication: Lumbosacral radiculitis is not relieved by medication management or other conservative care and interfering with self-care and mobility.   Informed consent was obtained after describing risk and benefits of the procedure with the patient, this includes bleeding, bruising, infection, paralysis and medication side effects.  The patient wishes to proceed and has given written consent.  Patient was placed in prone position.  The lumbar area was marked and prepped with Betadine.  It was entered with a 25-gauge 1-1/2 inch needle and one mL of 1% lidocaine was injected into the skin and subcutaneous tissue.  Then a 22-gauge 3.5inch spinal needle was inserted into the Right S1 intervertebral foramen under AP, lateral, and oblique view.  Then a solution containing one mL of 10 mg per mL dexamethasone and 2 mL of 1% lidocaine was injected.  The patient tolerated procedure well.  Post procedure instructions were given.  Please see post procedure form.

## 2014-12-03 NOTE — Progress Notes (Signed)
  PROCEDURE RECORD [facility]   Name: Emily Leonard DOB:1946/09/21 MRN: [id number]  Date:12/03/2014  Physician: Alysia Penna, MD    Nurse/CMA: Hanne Kegg RN  Allergies:  Allergies  Allergen Reactions  . Erythromycin     REACTION: Swelling  . Latex     REACTION: Blisters  . Morphine Sulfate     REACTION: Halucinations/deep sleep  . Clozapine Rash  . Fentanyl Nausea Only    "Feels like you are a hundred miles away when talking to her"    Consent Signed: Yes.    Is patient diabetic? No.  CBG today?   Pregnant: No. LMP: No LMP recorded. Patient has had a hysterectomy. (age 69-55)  Anticoagulants: no Anti-inflammatory: no Antibiotics: no  Procedure: right S1 epidural steroid injection Position: Prone Start Time: 11:35 End Time  11:39:  Fluoro Time: 22 seconds  RN/CMA Biomedical engineer    Time 11:14 11:44    BP 140/80 148/91    Pulse 78 66    Respirations 14 14    O2 Sat 95 96    S/S 6 6    Pain Level 8/10 6/10     D/C home with husband , [name], D/C instructions reviewed, and sits independently.

## 2014-12-14 ENCOUNTER — Telehealth: Payer: Self-pay | Admitting: *Deleted

## 2014-12-14 NOTE — Telephone Encounter (Signed)
Emily Leonard called and said that her whole left leg and back is hurting her and she needs some help.  Her last appt was an injection on her right side.  We can set her up for tomorrow at 11:30 She knows to be here no later than 11:15.

## 2014-12-15 ENCOUNTER — Ambulatory Visit (HOSPITAL_BASED_OUTPATIENT_CLINIC_OR_DEPARTMENT_OTHER): Payer: Medicare Other | Admitting: Physical Medicine & Rehabilitation

## 2014-12-15 ENCOUNTER — Encounter: Payer: Medicare Other | Attending: Physical Medicine & Rehabilitation

## 2014-12-15 ENCOUNTER — Encounter: Payer: Self-pay | Admitting: Physical Medicine & Rehabilitation

## 2014-12-15 VITALS — BP 150/85 | HR 64 | Resp 14

## 2014-12-15 DIAGNOSIS — M5417 Radiculopathy, lumbosacral region: Secondary | ICD-10-CM | POA: Diagnosis present

## 2014-12-15 DIAGNOSIS — M5416 Radiculopathy, lumbar region: Secondary | ICD-10-CM

## 2014-12-15 NOTE — Progress Notes (Signed)
  PROCEDURE RECORD [facility]   Name: Emily Leonard DOB:[birth year] MRN: [id number]  Date:12/15/2014  Physician: Alysia Penna, MD    Nurse/CMA: Mancel Parsons, CMA  Allergies:  Allergies  Allergen Reactions  . Erythromycin     REACTION: Swelling  . Latex     REACTION: Blisters  . Morphine Sulfate     REACTION: Halucinations/deep sleep  . Clozapine Rash  . Fentanyl Nausea Only    "Feels like you are a hundred miles away when talking to her"    Consent Signed: Yes.    Is patient diabetic? No.  CBG today?   Pregnant: No. LMP: No LMP recorded. Patient has had a hysterectomy. (age [age])  Anticoagulants: no Anti-inflammatory: no Antibiotics: no  Procedure: transforaminal epidural steroid injection  Position: Prone Start Time:11:25 am End Time: 11:28 am  Fluoro Time: 12  RN/CMA Rolan Bucco Geo Slone    Time 11:10 am 11:35    BP 150/85 162/74    Pulse 64 66    Respirations 14 14    O2 Sat 97 96    S/S 6 6    Pain Level 10/10 3/10     D/C home with husband, [name], D/C instructions reviewed, and sits independently.

## 2014-12-15 NOTE — Progress Notes (Signed)
Left S1 transforaminal epidural steroid injection under fluoroscopic guidance  Indication: Lumbosacral radiculitis is not relieved by medication management or other conservative care and interfering with self-care and mobility.   Informed consent was obtained after describing risk and benefits of the procedure with the patient, this includes bleeding, bruising, infection, paralysis and medication side effects.  The patient wishes to proceed and has given written consent.  Patient was placed in prone position.  The lumbar area was marked and prepped with Betadine.  It was entered with a 25-gauge 1-1/2 inch needle and one mL of 1% lidocaine was injected into the skin and subcutaneous tissue.  Then a 22-gauge 3.5inch spinal needle was inserted into the left S1 intervertebral foramen under AP, lateral, and oblique view.  Then a solution containing one mL of 10 mg per mL dexamethasone and 2 mL of 1% lidocaine was injected.  The patient tolerated procedure well.  Post procedure instructions were given.  Please see post procedure form.

## 2014-12-15 NOTE — Patient Instructions (Signed)

## 2014-12-28 ENCOUNTER — Telehealth: Payer: Self-pay | Admitting: *Deleted

## 2014-12-28 NOTE — Telephone Encounter (Signed)
I called the pt and back and reminded her that she has an appt. on 12/31/2014 with Dr.Kirsteins

## 2014-12-28 NOTE — Telephone Encounter (Signed)
Patient is having trouble with her left leg, cramping in her toes, her instep, and up the leg. Pt asking for help and is hoping to get in to see the doctor this week

## 2014-12-31 ENCOUNTER — Encounter: Payer: Self-pay | Admitting: Physical Medicine & Rehabilitation

## 2014-12-31 ENCOUNTER — Ambulatory Visit (HOSPITAL_BASED_OUTPATIENT_CLINIC_OR_DEPARTMENT_OTHER): Payer: Medicare Other | Admitting: Physical Medicine & Rehabilitation

## 2014-12-31 VITALS — BP 143/68 | HR 62 | Resp 14

## 2014-12-31 DIAGNOSIS — M7062 Trochanteric bursitis, left hip: Secondary | ICD-10-CM

## 2014-12-31 DIAGNOSIS — M7061 Trochanteric bursitis, right hip: Secondary | ICD-10-CM

## 2014-12-31 DIAGNOSIS — M5417 Radiculopathy, lumbosacral region: Secondary | ICD-10-CM | POA: Diagnosis not present

## 2014-12-31 NOTE — Progress Notes (Signed)
Bilateral Trochanteric bursa injection With  ultrasound guidance  Indication Trochanteric bursitis. Exam has tenderness over the greater trochanter of the hip. Pain has not responded to conservative care such as exercise therapy and oral medications. Pain interferes with sleep or with mobility Informed consent was obtained after describing risks and benefits of the procedure with the patient these include bleeding bruising and infection. Patient has signed written consent form. Patient placed in a lateral decubitus position with the affected hip superior. Point of maximal pain was palpated marked and prepped with Betadine and entered with a needle to bone contact. Needle slightly withdrawn then 6mg  of betamethasone with 4 cc 1% lidocaine were injected. Patient tolerated procedure well. Post procedure instructions given. Bilateral procedure targeting the anterior facet of the greater trochanters

## 2014-12-31 NOTE — Patient Instructions (Signed)
Trochanteric Bursitis You have hip pain due to trochanteric bursitis. Bursitis means that the sack near the outside of the hip is filled with fluid and inflamed. This sack is made up of protective soft tissue. The pain from trochanteric bursitis can be severe and keep you from sleep. It can radiate to the buttocks or down the outside of the thigh to the knee. The pain is almost always worse when rising from the seated or lying position and with walking. Pain can improve after you take a few steps. It happens more often in people with hip joint and lumbar spine problems, such as arthritis or previous surgery. Very rarely the trochanteric bursa can become infected, and antibiotics and/or surgery may be needed. Treatment often includes an injection of local anesthetic mixed with cortisone medicine. This medicine is injected into the area where it is most tender over the hip. Repeat injections may be necessary if the response to treatment is slow. You can apply ice packs over the tender area for 30 minutes every 2 hours for the next few days. Anti-inflammatory and/or narcotic pain medicine may also be helpful. Limit your activity for the next few days if the pain continues. See your caregiver in 5-10 days if you are not greatly improved.  SEEK IMMEDIATE MEDICAL CARE IF:  You develop severe pain, fever, or increased redness.  You have pain that radiates below the knee. EXERCISES STRETCHING EXERCISES - Trochanteric Bursitis  These exercises may help you when beginning to rehabilitate your injury. Your symptoms may resolve with or without further involvement from your physician, physical therapist, or athletic trainer. While completing these exercises, remember:   Restoring tissue flexibility helps normal motion to return to the joints. This allows healthier, less painful movement and activity.  An effective stretch should be held for at least 30 seconds.  A stretch should never be painful. You should only  feel a gentle lengthening or release in the stretched tissue. STRETCH - Iliotibial Band  On the floor or bed, lie on your side so your injured leg is on top. Bend your knee and grab your ankle.  Slowly bring your knee back so that your thigh is in line with your trunk. Keep your heel at your buttocks and gently arch your back so your head, shoulders and hips line up.  Slowly lower your leg so that your knee approaches the floor/bed until you feel a gentle stretch on the outside of your thigh. If you do not feel a stretch and your knee will not fall farther, place the heel of your opposite foot on top of your knee and pull your thigh down farther.  Hold this stretch for __________ seconds.  Repeat __________ times. Complete this exercise __________ times per day. STRETCH - Hamstrings, Supine   Lie on your back. Loop a belt or towel over the ball of your foot as shown.  Straighten your knee and slowly pull on the belt to raise your injured leg. Do not allow the knee to bend. Keep your opposite leg flat on the floor.  Raise the leg until you feel a gentle stretch behind your knee or thigh. Hold this position for __________ seconds.  Repeat __________ times. Complete this stretch __________ times per day. STRETCH - Quadriceps, Prone   Lie on your stomach on a firm surface, such as a bed or padded floor.  Bend your knee and grasp your ankle. If you are unable to reach your ankle or pant leg, use a belt   around your foot to lengthen your reach.  Gently pull your heel toward your buttocks. Your knee should not slide out to the side. You should feel a stretch in the front of your thigh and/or knee.  Hold this position for __________ seconds.  Repeat __________ times. Complete this stretch __________ times per day. STRETCHING - Hip Flexors, Lunge Half kneel with your knee on the floor and your opposite knee bent and directly over your ankle.  Keep good posture with your head over your  shoulders. Tighten your buttocks to point your tailbone downward; this will prevent your back from arching too much.  You should feel a gentle stretch in the front of your thigh and/or hip. If you do not feel any resistance, slightly slide your opposite foot forward and then slowly lunge forward so your knee once again lines up over your ankle. Be sure your tailbone remains pointed downward.  Hold this stretch for __________ seconds.  Repeat __________ times. Complete this stretch __________ times per day. STRETCH - Adductors, Lunge  While standing, spread your legs.  Lean away from your injured leg by bending your opposite knee. You may rest your hands on your thigh for balance.  You should feel a stretch in your inner thigh. Hold for __________ seconds.  Repeat __________ times. Complete this exercise __________ times per day. Document Released: 11/09/2004 Document Revised: 02/16/2014 Document Reviewed: 01/14/2009 Exit[employer] Patient Information 2015 [employer]. This information is not intended to replace advice given to you by your health care provider. Make sure you discuss any questions you have with your health care provider.  

## 2015-01-28 ENCOUNTER — Ambulatory Visit: Payer: Medicare Other

## 2015-01-28 ENCOUNTER — Ambulatory Visit: Payer: Medicare Other | Admitting: Physical Medicine & Rehabilitation

## 2015-02-02 ENCOUNTER — Encounter: Payer: Medicare Other | Attending: Physical Medicine & Rehabilitation

## 2015-02-02 ENCOUNTER — Encounter: Payer: Self-pay | Admitting: Physical Medicine & Rehabilitation

## 2015-02-02 ENCOUNTER — Ambulatory Visit (HOSPITAL_BASED_OUTPATIENT_CLINIC_OR_DEPARTMENT_OTHER): Payer: Medicare Other | Admitting: Physical Medicine & Rehabilitation

## 2015-02-02 VITALS — BP 133/83 | HR 62 | Resp 14

## 2015-02-02 DIAGNOSIS — M5417 Radiculopathy, lumbosacral region: Secondary | ICD-10-CM | POA: Insufficient documentation

## 2015-02-02 DIAGNOSIS — M5416 Radiculopathy, lumbar region: Secondary | ICD-10-CM

## 2015-02-02 MED ORDER — DICLOFENAC SODIUM 1 % TD GEL: 2.0000 g | Freq: Four times a day (QID) | TRANSDERMAL | Status: DC

## 2015-02-02 NOTE — Progress Notes (Signed)
  PROCEDURE RECORD [facility]   Name: Emily Leonard DOB:06-Jun-1946 MRN: [id number]  Date:02/02/2015  Physician: Alysia Penna, MD    Nurse/CMA: Mancel Parsons  Allergies:  Allergies  Allergen Reactions  . Erythromycin     REACTION: Swelling  . Latex     REACTION: Blisters  . Morphine Sulfate     REACTION: Halucinations/deep sleep  . Clozapine Rash  . Fentanyl Nausea Only    "Feels like you are a hundred miles away when talking to her"    Consent Signed: Yes.    Is patient diabetic? No.  CBG today?   Pregnant: No. LMP: No LMP recorded. Patient has had a hysterectomy. (age 34-55)  Anticoagulants: no Anti-inflammatory: yes (aspirin 81mg ) Antibiotics: no  Procedure: transforaminal epidural steroid injection  Position: Prone Start Time: 10:44 am  End Time: 10:54am  Fluoro Time: 31  RN/CMA Rolan Bucco Teshia Mahone    Time 10:25 am 11:00am    BP 133/83 150/67    Pulse 62 67    Respirations 14 14    O2 Sat 95 95    S/S 6 6    Pain Level 7/10 4/10     D/C home with husband, [name], D/C instructions reviewed, and sits independently.

## 2015-02-02 NOTE — Progress Notes (Signed)
Bilateral S1 transforaminal epidural steroid injection under fluoroscopic guidance  Indication: Lumbosacral radiculitis is not relieved by medication management or other conservative care and interfering with self-care and mobility.   Informed consent was obtained after describing risk and benefits of the procedure with the patient, this includes bleeding, bruising, infection, paralysis and medication side effects.  The patient wishes to proceed and has given written consent.  Patient was placed in prone position.  The lumbar area was marked and prepped with Betadine.  It was entered with a 25-gauge 1-1/2 inch needle and one mL of 1% lidocaine was injected into the skin and subcutaneous tissue.  Then a 22-gauge 3.5inch spinal needle was inserted into the Left S1 sacral foramen under AP, lateral, and oblique view.  Then a solution containing one mL of 10 mg per mL dexamethasone and 2 mL of 1% lidocaine was injected. The same procedure was performed on the right side using same needle, injectate and technique. The patient tolerated procedure well.  Post procedure instructions were given.  Please see post procedure form.  Preinjection pain 7-8 out of 10 Postinjection pain 4 out of 10

## 2015-02-02 NOTE — Patient Instructions (Signed)

## 2015-02-16 ENCOUNTER — Ambulatory Visit: Payer: Medicare Other | Admitting: Physical Medicine & Rehabilitation

## 2015-03-18 ENCOUNTER — Other Ambulatory Visit: Payer: Self-pay | Admitting: Physical Medicine & Rehabilitation

## 2015-03-30 ENCOUNTER — Encounter: Payer: Medicare Other | Attending: Physical Medicine & Rehabilitation

## 2015-03-30 ENCOUNTER — Ambulatory Visit (HOSPITAL_BASED_OUTPATIENT_CLINIC_OR_DEPARTMENT_OTHER): Payer: Medicare Other | Admitting: Physical Medicine & Rehabilitation

## 2015-03-30 ENCOUNTER — Encounter: Payer: Self-pay | Admitting: Physical Medicine & Rehabilitation

## 2015-03-30 VITALS — BP 134/81 | HR 70 | Resp 16

## 2015-03-30 DIAGNOSIS — M7061 Trochanteric bursitis, right hip: Secondary | ICD-10-CM | POA: Diagnosis not present

## 2015-03-30 DIAGNOSIS — M5417 Radiculopathy, lumbosacral region: Secondary | ICD-10-CM | POA: Diagnosis present

## 2015-03-30 DIAGNOSIS — M5416 Radiculopathy, lumbar region: Secondary | ICD-10-CM | POA: Diagnosis not present

## 2015-03-30 DIAGNOSIS — M7062 Trochanteric bursitis, left hip: Secondary | ICD-10-CM

## 2015-03-30 NOTE — Progress Notes (Signed)
Bilateral Trochanteric bursa injection With  ultrasound guidance  Indication Trochanteric bursitis. Exam has tenderness over the greater trochanter of the hip. Pain has not responded to conservative care such as exercise therapy and oral medications. Pain interferes with sleep or with mobility Informed consent was obtained after describing risks and benefits of the procedure with the patient these include bleeding bruising and infection. Patient has signed written consent form. Patient placed in a lateral decubitus position with the affected hip superior. Point of maximal pain was palpated marked and prepped with Betadine and entered with a needle to bone contact. Needle slightly withdrawn then 6mg  of betamethasone with 4 cc 1% lidocaine were injected. Patient tolerated procedure well. Post procedure instructions given. Bilateral procedure targeting the anterior facet of the greater trochanters

## 2015-03-30 NOTE — Patient Instructions (Signed)
Hip Bursitis Bursitis is a swelling and soreness (inflammation) of a fluid-filled sac (bursa). This sac overlies and protects the joints.  CAUSES   Injury.  Overuse of the muscles surrounding the joint.  Arthritis.  Gout.  Infection.  Cold weather.  Inadequate warm-up and conditioning prior to activities. The cause may not be known.  SYMPTOMS   Mild to severe irritation.  Tenderness and swelling over the outside of the hip.  Pain with motion of the hip.  If the bursa becomes infected, a fever may be present. Redness, tenderness, and warmth will develop over the hip. Symptoms usually lessen in 3 to 4 weeks with treatment, but can come back. TREATMENT If conservative treatment does not work, your caregiver may advise draining the bursa and injecting cortisone into the area. This may speed up the healing process. This may also be used as an initial treatment of choice. HOME CARE INSTRUCTIONS   Apply ice to the affected area for 15-20 minutes every 3 to 4 hours while awake for the first 2 days. Put the ice in a plastic bag and place a towel between the bag of ice and your skin.  Rest the painful joint as much as possible, but continue to put the joint through a normal range of motion at least 4 times per day. When the pain lessens, begin normal, slow movements and usual activities to help prevent stiffness of the hip.  Only take over-the-counter or prescription medicines for pain, discomfort, or fever as directed by your caregiver.  Use crutches to limit weight bearing on the hip joint, if advised.  Elevate your painful hip to reduce swelling. Use pillows for propping and cushioning your legs and hips.  Gentle massage may provide comfort and decrease swelling. SEEK IMMEDIATE MEDICAL CARE IF:   Your pain increases even during treatment, or you are not improving.  You have a fever.  You have heat and inflammation over the involved bursa.  You have any other questions or  concerns. MAKE SURE YOU:   Understand these instructions.  Will watch your condition.  Will get help right away if you are not doing well or get worse. Document Released: 03/24/2002 Document Revised: 12/25/2011 Document Reviewed: 10/21/2008 ExitCare Patient Information 2015 [employer]. This information is not intended to replace advice given to you by your health care provider. Make sure you discuss any questions you have with your health care provider.  

## 2015-04-13 ENCOUNTER — Ambulatory Visit (HOSPITAL_BASED_OUTPATIENT_CLINIC_OR_DEPARTMENT_OTHER): Payer: Medicare Other | Admitting: Physical Medicine & Rehabilitation

## 2015-04-13 ENCOUNTER — Encounter: Payer: Self-pay | Admitting: Physical Medicine & Rehabilitation

## 2015-04-13 VITALS — BP 137/61 | HR 61 | Resp 14

## 2015-04-13 DIAGNOSIS — M5417 Radiculopathy, lumbosacral region: Secondary | ICD-10-CM

## 2015-04-13 NOTE — Progress Notes (Signed)
Bilateral S1 transforaminal epidural steroid injection under fluoroscopic guidance  Indication: Lumbosacral radiculitis is not relieved by medication management or other conservative care and interfering with self-care and mobility.   Informed consent was obtained after describing risk and benefits of the procedure with the patient, this includes bleeding, bruising, infection, paralysis and medication side effects.  The patient wishes to proceed and has given written consent.  Patient was placed in prone position.  The lumbar area was marked and prepped with Betadine.  It was entered with a 25-gauge 1-1/2 inch needle and one mL of 1% lidocaine was injected into the skin and subcutaneous tissue.  Then a 22-gauge 3.5inch spinal needle was inserted into the Left S1 sacral foramen under AP, lateral, and oblique view.  Then a solution containing one mL of 10 mg per mL dexamethasone and 2 mL of 1% lidocaine was injected. The same procedure was performed on the right side using same needle, injectate and technique. The patient tolerated procedure well.  Post procedure instructions were given.  Please see post procedure form.  Preinjection pain 8 out of 10 Postinjection pain  7 out of 10

## 2015-04-13 NOTE — Patient Instructions (Signed)

## 2015-04-13 NOTE — Progress Notes (Signed)
  PROCEDURE RECORD  [facility]   Name: Emily Leonard DOB:01/06/1946 MRN: [id number]  Date:04/13/2015  Physician: Alysia Penna, MD    Nurse/CMA: Mancel Parsons  Allergies:  Allergies  Allergen Reactions  . Erythromycin     REACTION: Swelling  . Latex     REACTION: Blisters  . Morphine Sulfate     REACTION: Halucinations/deep sleep  . Clozapine Rash  . Fentanyl Nausea Only    "Feels like you are a hundred miles away when talking to her"    Consent Signed: Yes.    Is patient diabetic? No.  CBG today?   Pregnant: No. LMP: No LMP recorded. Patient has had a hysterectomy. (age 42-55)  Anticoagulants: no Anti-inflammatory: no Antibiotics: no  Procedure: bilateral transforaminal epidural steroid injection  Position: Prone Start Time: 10:14am  End Time: 10:23am  Fluoro Time: 54  RN/CMA Rolan Bucco Tavion Senkbeil    Time 9:35 am 10:29am    BP 137/61 138/66    Pulse 61 63    Respirations 14 14    O2 Sat 96 97    S/S 6 6    Pain Level 8/10 7/10     D/C home with husband, [name], D/C instructions reviewed, and sits independently.

## 2015-04-14 ENCOUNTER — Other Ambulatory Visit: Payer: Self-pay | Admitting: *Deleted

## 2015-04-14 MED ORDER — TRAMADOL HCL 50 MG PO TABS: ORAL_TABLET | ORAL | Status: DC

## 2015-04-26 ENCOUNTER — Telehealth: Payer: Self-pay | Admitting: *Deleted

## 2015-04-26 NOTE — Telephone Encounter (Signed)
Notified Rehema.

## 2015-04-26 NOTE — Telephone Encounter (Signed)
Will need to contact her neurosurgeon

## 2015-04-26 NOTE — Telephone Encounter (Signed)
Emily Leonard called and says that she has been having a lot of pain for last 4 days  In her hip to top of foot and "cheekbone".  (buttocks) I spoke with her and it is both hips and the top of her foot is really bothering her. She says it is down to the bone in her buttocks.  She says she got very little relief from last trans lesi.  Please advise.

## 2015-05-04 ENCOUNTER — Ambulatory Visit: Payer: Medicare Other | Admitting: Physical Medicine & Rehabilitation

## 2015-05-12 ENCOUNTER — Other Ambulatory Visit: Payer: Self-pay | Admitting: Cardiology

## 2015-05-12 NOTE — Telephone Encounter (Signed)
REFILL 

## 2015-06-03 ENCOUNTER — Ambulatory Visit: Payer: Medicare Other | Admitting: Physical Medicine & Rehabilitation

## 2015-06-08 ENCOUNTER — Ambulatory Visit: Payer: Medicare Other | Admitting: Physical Medicine & Rehabilitation

## 2015-06-08 ENCOUNTER — Ambulatory Visit (INDEPENDENT_AMBULATORY_CARE_PROVIDER_SITE_OTHER): Payer: Medicare Other | Admitting: Cardiology

## 2015-06-08 ENCOUNTER — Encounter: Payer: Self-pay | Admitting: Cardiology

## 2015-06-08 ENCOUNTER — Ambulatory Visit: Payer: Medicare Other

## 2015-06-08 VITALS — BP 128/74 | HR 61 | Ht 64.0 in | Wt 169.2 lb

## 2015-06-08 DIAGNOSIS — R072 Precordial pain: Secondary | ICD-10-CM | POA: Diagnosis not present

## 2015-06-08 MED ORDER — BUDESONIDE-FORMOTEROL FUMARATE 160-4.5 MCG/ACT IN AERO: 2.0000 | INHALATION_SPRAY | Freq: Every day | RESPIRATORY_TRACT | Status: DC | PRN

## 2015-06-08 NOTE — Patient Instructions (Signed)
Your physician wants you to follow-up in: 1 Year. You will receive a reminder letter in the mail two months in advance. If you don't receive a letter, please call our office to schedule the follow-up appointment.  

## 2015-06-08 NOTE — Progress Notes (Signed)
HPI The patient presents for follow up of chest discomfort and palpitations. I saw her previously for this. She's had a stress test which was unremarkable. This year she is not particularly bothered by it. She feels rare palpitations. She takes p.m. Cardizem and this seems to help. She's not had any presyncope or syncope. She's had no chest pressure, neck or arm discomfort. Unfortunately is limited by significant back pain and she might actually have to have surgery.   Allergies  Allergen Reactions  . Erythromycin     REACTION: Swelling  . Latex     REACTION: Blisters  . Morphine Sulfate     REACTION: Halucinations/deep sleep  . Clozapine Rash  . Fentanyl Nausea Only    "Feels like you are a hundred miles away when talking to her"    Current Outpatient Prescriptions  Medication Sig Dispense Refill  . alendronate (FOSAMAX) 70 MG tablet Take 70 mg by mouth every 7 (seven) days. Saturday    . amitriptyline (ELAVIL) 100 MG tablet Take 100 mg by mouth at bedtime.    Marland Kitchen amitriptyline (ELAVIL) 150 MG tablet   1  . amitriptyline (ELAVIL) 50 MG tablet Take 1 tablet by mouth 2 (two) times daily. Take 2 tabs at bedtime  1  . aspirin EC 81 MG tablet Take 81 mg by mouth daily.    . baclofen (LIORESAL) 10 MG tablet Take 10 mg by mouth at bedtime as needed. For muscle spasms    . calcium carbonate (OS-CAL) 600 MG TABS Take 1,200 mg by mouth 2 (two) times daily with a meal.     . cyanocobalamin 500 MCG tablet Take by mouth.    . diclofenac sodium (VOLTAREN) 1 % GEL Apply 2 g topically 4 (four) times daily. 3 Tube 1  . diltiazem (CARDIZEM) 30 MG tablet TAKE ONE TABLET BY MOUTH AT BEDTIME 90 tablet 0  . DULoxetine (CYMBALTA) 30 MG capsule Take 1 capsule (30 mg total) by mouth daily. 30 capsule 5  . estradiol (ESTRACE) 1 MG tablet Take 1 mg by mouth daily.      . folic acid (FOLVITE) 356 MCG tablet Take by mouth.    . gabapentin (NEURONTIN) 800 MG tablet Take 800 mg by mouth 2 (two) times daily.    Marland Kitchen  GAVILYTE-N WITH FLAVOR PACK 420 G solution     . hydrochlorothiazide 25 MG tablet Take 25 mg by mouth daily.      Marland Kitchen levothyroxine (SYNTHROID, LEVOTHROID) 112 MCG tablet Take 112 mcg by mouth daily.      . meloxicam (MOBIC) 15 MG tablet Take 15 mg by mouth daily.     . methocarbamol (ROBAXIN) 750 MG tablet Take 750 mg by mouth 2 (two) times daily.    . nabumetone (RELAFEN) 500 MG tablet     . Omega-3 Fatty Acids (FISH OIL) 1000 MG CAPS Take 1,000 mg by mouth 1 day or 1 dose.      Marland Kitchen omeprazole (PRILOSEC) 40 MG capsule     . pravastatin (PRAVACHOL) 20 MG tablet Take 20 mg by mouth daily.      . pravastatin (PRAVACHOL) 40 MG tablet   0  . pregabalin (LYRICA) 50 MG capsule Take 50 mg by mouth 2 (two) times daily. Take 1 tab twice a day    . PROAIR HFA 108 (90 BASE) MCG/ACT inhaler   0  . PROCTOSOL HC 2.5 % rectal cream     . SYNTHROID 100 MCG tablet   0  .  traMADol (ULTRAM) 50 MG tablet TAKE ONE TABLET BY MOUTH TWICE DAILY (one month supply) 60 tablet 0   No current facility-administered medications for this visit.    Past Medical History  Diagnosis Date  . Essential hypertension, benign   . Thyroid disease   . Hyperlipidemia   . GERD (gastroesophageal reflux disease)   . Palpitations   . Chronic pain syndrome   . Insomnia   . TIA (transient ischemic attack)   . Anxiety   . Restless leg syndrome   . Degenerative disc disease   . Degenerative joint disease   . Osteoarthritis     Past Surgical History  Procedure Laterality Date  . Abdominal hysterectomy    . Cataract extraction, bilateral    . Lumbar spine surgery      x 3  . Appendectomy    . Hemorrhoid surgery  03/2013  . Colonoscopy  02/2013  . Cervical spine surgery      ROS:  As stated in the HPI and negative for all other systems.   PHYSICAL EXAM BP 128/74 mmHg  Pulse 61  Ht 5\' 4"  (1.626 m)  Wt 169 lb 3 oz (76.743 kg)  BMI 29.03 kg/m2 GENERAL:  Well appearing NECK:  No jugular venous distention, waveform within  normal limits, carotid upstroke brisk and symmetric, no bruits, no thyromegaly LUNGS:  Clear to auscultation bilaterally BACK:  No CVA tenderness CHEST:  Unremarkable HEART:  PMI not displaced or sustained,S1 and S2 within normal limits, no S3, no S4, no clicks, no rubs, no murmurs ABD:  Flat, positive bowel sounds normal in frequency in pitch, no bruits, no rebound, no guarding, no midline pulsatile mass, no hepatomegaly, no splenomegaly EXT:  2 plus pulses throughout, no edema, no cyanosis no clubbing   EKG:  Sinus rhythm, rate 61, left axis deviation, poor anterior R wave progression, no acute ST-T wave changes.  Unchanged from previous.    06/08/2015   ASSESSMENT AND PLAN  PALPITATIONS:  These are improved with Cardizem. No change in therapy is indicated.  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 However, this was mild and I am not planning follow up this year.   HTN:  Her blood pressure is controlled. She will continue the meds as listed.

## 2015-08-12 ENCOUNTER — Other Ambulatory Visit: Payer: Self-pay | Admitting: Cardiology

## 2015-08-27 ENCOUNTER — Ambulatory Visit (HOSPITAL_BASED_OUTPATIENT_CLINIC_OR_DEPARTMENT_OTHER): Payer: Medicare Other | Admitting: Physical Medicine & Rehabilitation

## 2015-08-27 ENCOUNTER — Encounter: Payer: Self-pay | Admitting: Physical Medicine & Rehabilitation

## 2015-08-27 ENCOUNTER — Encounter: Payer: Medicare Other | Attending: Physical Medicine & Rehabilitation

## 2015-08-27 VITALS — BP 136/87 | HR 61 | Resp 14

## 2015-08-27 DIAGNOSIS — M7062 Trochanteric bursitis, left hip: Secondary | ICD-10-CM | POA: Insufficient documentation

## 2015-08-27 DIAGNOSIS — M7061 Trochanteric bursitis, right hip: Secondary | ICD-10-CM

## 2015-08-27 NOTE — Progress Notes (Signed)
Bilateral Trochanteric bursa injection With  ultrasound guidance  Indication Trochanteric bursitis. Exam has tenderness over the greater trochanter of the hip. Pain has not responded to conservative care such as exercise therapy and oral medications. Pain interferes with sleep or with mobility Informed consent was obtained after describing risks and benefits of the procedure with the patient these include bleeding bruising and infection. Patient has signed written consent form. Patient placed in a lateral decubitus position with the affected hip superior. Point of maximal pain was palpated marked and prepped with Betadine and entered with a needle to bone contact. Needle slightly withdrawn then 6mg  of betamethasone with 4 cc 1% lidocaine were injected. Patient tolerated procedure well. Post procedure instructions given.

## 2015-08-27 NOTE — Patient Instructions (Signed)
Every 3 month hip injection

## 2015-09-16 ENCOUNTER — Other Ambulatory Visit: Payer: Self-pay | Admitting: Physical Medicine & Rehabilitation

## 2015-10-19 ENCOUNTER — Telehealth: Payer: Self-pay

## 2015-10-19 NOTE — Telephone Encounter (Signed)
Pt called to inform AK that she would like the pain pump in her chest that her and AK discussed two years ago. Pt agreed to wait until next appt on 11/24/14 to discuss with AK.

## 2015-11-15 ENCOUNTER — Other Ambulatory Visit: Payer: Self-pay | Admitting: Physical Medicine & Rehabilitation

## 2015-11-15 NOTE — Telephone Encounter (Signed)
Is refill okay? I do not see documentation. Thanks.

## 2015-11-16 ENCOUNTER — Other Ambulatory Visit: Payer: Self-pay | Admitting: Physical Medicine & Rehabilitation

## 2015-11-17 NOTE — Telephone Encounter (Signed)
Refill on tramadol was denied by [name]. He is not prescribing pain medication for Dmiyah.

## 2015-11-25 ENCOUNTER — Ambulatory Visit (HOSPITAL_BASED_OUTPATIENT_CLINIC_OR_DEPARTMENT_OTHER): Payer: Medicare Other | Admitting: Physical Medicine & Rehabilitation

## 2015-11-25 ENCOUNTER — Encounter: Payer: Self-pay | Admitting: Physical Medicine & Rehabilitation

## 2015-11-25 ENCOUNTER — Encounter: Payer: Medicare Other | Attending: Physical Medicine & Rehabilitation

## 2015-11-25 VITALS — BP 126/80 | HR 58 | Resp 14

## 2015-11-25 DIAGNOSIS — M545 Low back pain: Secondary | ICD-10-CM

## 2015-11-25 DIAGNOSIS — M7062 Trochanteric bursitis, left hip: Secondary | ICD-10-CM | POA: Diagnosis not present

## 2015-11-25 DIAGNOSIS — G8929 Other chronic pain: Secondary | ICD-10-CM

## 2015-11-25 DIAGNOSIS — M961 Postlaminectomy syndrome, not elsewhere classified: Secondary | ICD-10-CM | POA: Diagnosis not present

## 2015-11-25 DIAGNOSIS — M7061 Trochanteric bursitis, right hip: Secondary | ICD-10-CM | POA: Diagnosis not present

## 2015-11-25 DIAGNOSIS — Z5181 Encounter for therapeutic drug level monitoring: Secondary | ICD-10-CM

## 2015-11-25 DIAGNOSIS — Z79899 Other long term (current) drug therapy: Secondary | ICD-10-CM | POA: Diagnosis not present

## 2015-11-25 NOTE — Patient Instructions (Addendum)
You cannot take tramadol and amitriptyline. It is unclear whether you're taking the amitriptyline. It is listed on your medication list but you denied taking it. We'll check a urine screen and see for sure. We will make a decision on the tramadol based on the urine screen  I also highly advise you to have somebody help you with your medications.   Upcoming Encounters Upcoming Encounters  Date Type Specialty Providers Description  11/30/2015 Appointment Orthopedic Surgery Porras, Earney Navy, MD  [address]  [phone]  [facility]  [place], [place] [address]  [id number]  [phone]    12/02/2015 Appointment Physical Medicine and Rehabilitation Ophelia Shoulder [place], [place]  [address]  [id number]  [place], [place] [address]  [id number]  [phone] (Fax)    12/07/2015 Appointment Family Medicine Charmayne Sheer, MD  [facility]  [address]  [place], [place] [address]  [id number]  [phone] (Fax)

## 2015-11-25 NOTE — Addendum Note (Signed)
Addended by: Geryl Rankins D on: 11/25/2015 11:16 AM   Modules accepted: Orders

## 2015-11-25 NOTE — Progress Notes (Signed)
Patient here with her daughter. Interval history includes falling off a coffee table and fracturing her left scaphoid. She is under the care of orthopedic surgery at [facility]. She has also seen a physical medicine rehabilitation at [facility]. Treated for plantar fasciitis. Patient's primary care physician is prescribing Her medications with the exception of tramadol which I prescribed in December 50 mg twice a day as well as Cymbalta 30 mg which I last prescribed in 2015. She did not bring her pill bottles with her today. She states she is not taking her Elavil prescribed by her primary care physician 150 mg a day, she is not clear whether she is taking the Lyrica the Cymbalta or the baclofen.  According to her daughter she was tested for dementia a couple years ago but this was reported as a normal cognitive evaluation. In the past patient has had confusion from fentanyl patch and this was discontinued. Patient is unclear which Dr. Is treating what condition. Her daughter would like to be with her during appointments.  Examination she has tenderness over the greater trochanters posterior greater than anterior. Posterior greater than lateral. Gait is without evidence of toe drag or knee instability Skin appears flushed.  Impression 1. Lumbar postlaminectomy syndrome she has both chronic axial as well as radicular pain. Her last epidural injections were in June 2015 bilateral S1 transforaminal. She states that her primary concern is her hip pain and these have been previously relieved with trochanteric bursa injections however last set were not as effective.  At this point I do not think she needs repeat epidural injections.  2. Polypharmacy, Patient is a poor historian and is unclear which medication she is taking and which medications she is not taking. She is adamant that the tramadol helps her the best for her pain. As I discussed with the patient she cannot take tramadol with a high dose  Elavil. She states she is not taking the Elavil however. We will check urine drug screen which will include amitriptyline. We should have a better idea of which medications she is taking. If she is not taking the Elavil I would feel comfortable restarting the tramadol. If she is taking the Elavil would consider weaning off of this and restarting tramadol Would also consider discontinuing Lyrica and baclofen. Also unclear whether she is taking Cymbalta.  Long discussion with patient and her daughter. I spoke with the daughter privately as well as with the patient in the room. Recommend family member with her for all appointments Recommend family member to assist with medication management. I printed out her next appointments with [facility] Department of orthopedics physical medicine rehabilitation as well as primary care Daughter and patient states that they will likely not go to the physical medicine  Over half of the 25 min visit was spent counseling and coordinating care.  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 Recommend posterior facet trochanteric bursa injection  Trochanteric bursa injection With or without ultrasound guidance  Indication Trochanteric bursitis. Exam has tenderness over the greater trochanter of the hip.Posterior aspect is most tender. Pain has not responded to conservative care such as exercise therapy and oral medications. Pain interferes with sleep or with mobility Informed consent was obtained after describing risks and benefits of the procedure with the patient these include bleeding bruising and infection. Patient has signed written consent form. Patient placed in a lateral decubitus position with the affected hip superior. Point of maximal pain was palpated  marked and prepped with Betadine and entered with a needle to bone contact.Posterior facet visualized under direct ultrasound guidance long axis view using linear  transducer. Right side was performed first followed by left Needle slightly withdrawn then 6mg  of betamethasone with 4 cc 1% lidocaine were injected. Patient tolerated procedure well. Post procedure instructions given.

## 2015-11-26 ENCOUNTER — Telehealth: Payer: Self-pay

## 2015-11-26 NOTE — Telephone Encounter (Signed)
Pt's daughter called with an updated med list. Will adjust on chart. She would also like to know if pt is positive for Tramadol because there was no medication bottle at the house.

## 2015-11-29 ENCOUNTER — Telehealth: Payer: Self-pay | Admitting: Physical Medicine & Rehabilitation

## 2015-11-29 NOTE — Telephone Encounter (Signed)
awaiting results of UDS

## 2015-11-29 NOTE — Telephone Encounter (Signed)
Patient is requesting a refill on her Tramadol.

## 2015-11-30 LAB — TOXASSURE SELECT,+ANTIDEPR,UR: PDF: 0

## 2015-11-30 MED ORDER — TRAMADOL HCL 50 MG PO TABS: 50.0000 mg | ORAL_TABLET | Freq: Two times a day (BID) | ORAL | Status: DC

## 2015-11-30 NOTE — Telephone Encounter (Signed)
Isaiah's UDS is resulted with tramadol, cymbalta and trazodone present.  Please advise

## 2015-11-30 NOTE — Addendum Note (Signed)
Addended by: Caro Hight on: 11/30/2015 08:45 AM   Modules accepted: Medications

## 2015-11-30 NOTE — Telephone Encounter (Signed)
Called to pharmacy and [employer], Pearlina's daughter notified

## 2015-11-30 NOTE — Addendum Note (Signed)
Addended by: Caro Hight on: 11/30/2015 10:32 AM   Modules accepted: Orders

## 2015-11-30 NOTE — Progress Notes (Signed)
Urine drug screen for this encounter is consistent for prescribed medication 

## 2015-11-30 NOTE — Telephone Encounter (Signed)
OK for tramadol 50mg  BID #60 1 rf

## 2015-12-31 ENCOUNTER — Ambulatory Visit (INDEPENDENT_AMBULATORY_CARE_PROVIDER_SITE_OTHER): Payer: Medicare Other | Admitting: Pulmonary Disease

## 2015-12-31 ENCOUNTER — Encounter: Payer: Self-pay | Admitting: Pulmonary Disease

## 2015-12-31 VITALS — BP 132/78 | HR 64 | Ht 66.0 in | Wt 150.8 lb

## 2015-12-31 DIAGNOSIS — J439 Emphysema, unspecified: Secondary | ICD-10-CM

## 2015-12-31 DIAGNOSIS — Z720 Tobacco use: Secondary | ICD-10-CM | POA: Diagnosis not present

## 2015-12-31 DIAGNOSIS — F17200 Nicotine dependence, unspecified, uncomplicated: Secondary | ICD-10-CM

## 2015-12-31 NOTE — Patient Instructions (Signed)
We will schedule you for screening CT of the chest.  Continue using the Symbicort and albuterol inhaler as prescribed.  Return to clinic in 1-2 months after the scan to discuss results and further workup if needed.

## 2015-12-31 NOTE — Progress Notes (Signed)
Subjective:    Patient ID: Emily Leonard, female    DOB: July 05, 1946, 70 y.o.   MRN: [id number]  HPI  Self-referral for evaluation of COPD, night sweats.  Emily Leonard is a 70 year old with past medical history as below. She is an active smoker and continues to smoke about half pack per day. She is a diagnosis of COPD. She has been prescribed Symbicort but is not using it regularly. She denies any respiratory symptoms of cough, wheezing, sputum production, fevers, chills, hemoptysis.  Her chief complaint today is night sweats just been going on for 3 months. This was preceded by hot flashes. She saw her primary care physician Dr. Noberto Retort last month and a workup is in process for anxiety, medication effects, endocrine, metabolic etiology.  DATA: PFTs 06/09/11 FVC 3.21 [106%) FEV1 2.24 (97%)  F/F 70 TLC 115% DLCO 72% Mild airflow obstruction, normal lung volumes, decreased diffusion capacity.  Social History: She smoked half pack per day for 45 years. She continues to smoke.Occasional alcohol use no drug use. She used to work on the [employer] for [employer]. She is currently retired. Denies exposures at work or at home.  Family History: Heart attack-brother, father. Hypertension- father, mother  Past Medical History  Diagnosis Date  . Essential hypertension, benign   . Thyroid disease   . Hyperlipidemia   . GERD (gastroesophageal reflux disease)   . Palpitations   . Chronic pain syndrome   . Insomnia   . TIA (transient ischemic attack)   . Anxiety   . Restless leg syndrome   . Degenerative disc disease   . Degenerative joint disease   . Osteoarthritis      Current outpatient prescriptions:  .  alendronate (FOSAMAX) 70 MG tablet, Take 70 mg by mouth every 7 (seven) days. Saturday, Disp: , Rfl:  .  aspirin EC 81 MG tablet, Take 81 mg by mouth daily., Disp: , Rfl:  .  budesonide-formoterol (SYMBICORT) 160-4.5 MCG/ACT inhaler, Inhale 2 puffs into the lungs daily as needed., Disp: 1  Inhaler, Rfl: 3 .  calcium carbonate (OS-CAL) 600 MG TABS, Take 1,200 mg by mouth 2 (two) times daily with a meal. , Disp: , Rfl:  .  cyanocobalamin 500 MCG tablet, Take by mouth., Disp: , Rfl:  .  diclofenac sodium (VOLTAREN) 1 % GEL, Apply 2 g topically 4 (four) times daily., Disp: 3 Tube, Rfl: 1 .  diltiazem (CARDIZEM) 30 MG tablet, TAKE ONE TABLET BY MOUTH AT BEDTIME, Disp: 90 tablet, Rfl: 2 .  DULoxetine (CYMBALTA) 30 MG capsule, Take 1 capsule (30 mg total) by mouth daily., Disp: 30 capsule, Rfl: 5 .  folic acid (FOLVITE) A999333 MCG tablet, Take by mouth., Disp: , Rfl:  .  GAVILYTE-N WITH FLAVOR PACK 420 G solution, , Disp: , Rfl:  .  hydrochlorothiazide 25 MG tablet, Take 25 mg by mouth daily.  , Disp: , Rfl:  .  levothyroxine (SYNTHROID, LEVOTHROID) 112 MCG tablet, Take 112 mcg by mouth daily.  , Disp: , Rfl:  .  Omega-3 Fatty Acids (FISH OIL) 1000 MG CAPS, Take 1,000 mg by mouth 1 day or 1 dose.  , Disp: , Rfl:  .  pravastatin (PRAVACHOL) 40 MG tablet, , Disp: , Rfl: 0 .  pregabalin (LYRICA) 50 MG capsule, Take 50 mg by mouth daily. , Disp: , Rfl:  .  PROCTOSOL HC 2.5 % rectal cream, , Disp: , Rfl:  .  traMADol (ULTRAM) 50 MG tablet, Take 1 tablet (50 mg  total) by mouth 2 (two) times daily., Disp: 60 tablet, Rfl: 1 .  traZODone (DESYREL) 150 MG tablet, Take 1 tablet by mouth at bedtime., Disp: , Rfl: 0 .  PROAIR HFA 108 (90 BASE) MCG/ACT inhaler, Reported on 12/31/2015, Disp: , Rfl: 0  Review of Systems Night sweats, flushing. Denies any cough, sputum production, hemoptysis, dyspnea, wheezing No fevers, chills, malaise, loss of weight, loss of appetite. No chest pain, palpitation. All other review of systems negative.    Objective:   Physical Exam Blood pressure 132/78, pulse 64, height 5\' 6"  (1.676 m), weight 150 lb 12.8 oz (68.402 kg), SpO2 100 %. Gen: No apparent distress Neuro: No gross focal deficits. Neck: No JVD, lymphadenopathy, thyromegaly. RS: Clear, No wheeze or  crackles CVS: S1-S2 heard, no murmurs rubs gallops. Abdomen: Soft, positive bowel sounds. Extremities: No edema.    Assessment & Plan:  #1 COPD She has mild COPD based on PFTs from 2012. She is on inhalers but does not need to use them. She currently does not have any respiratory symptoms of cough, sputum production, dyspnea, wheezing.  #2 Active smoker. Smoking cessation was discussed. She is not interested in quitting. We'll refer her for a screening CT of the chest.  #3 Night sweats. I don't believe this is related to her lungs. She does not have any symptoms suggestive of infectious etiology, TB and there are no risk factors. She is however an active smoker. We will schedule her for a screening CT of the chest to evaluate for malignancy. Further workup as per her primary care physician.  Plan: - Low dose screening CT of chest - Advised to quit smoking   Marshell Garfinkel MD [facility] Pager [phone] If no answer or after 3pm call: [phone] 12/31/2015, 9:47 AM

## 2016-01-06 ENCOUNTER — Telehealth: Payer: Self-pay | Admitting: Acute Care

## 2016-01-06 ENCOUNTER — Encounter: Payer: Self-pay | Admitting: *Deleted

## 2016-01-06 DIAGNOSIS — R0600 Dyspnea, unspecified: Secondary | ICD-10-CM

## 2016-01-06 NOTE — Telephone Encounter (Signed)
Called spoke with pt regarding lung cancer screening/questionnaire. Pt does not qualify for the program d/t not meeting the pack yeta history criteria. Will inform Dr. Vaughan Browner as he referred patient to Sarah.

## 2016-01-07 NOTE — Telephone Encounter (Signed)
Pt returning call and can be reached @ [phone].Emily Leonard

## 2016-01-10 NOTE — Telephone Encounter (Signed)
Routing to Lung Nodule Pool

## 2016-01-10 NOTE — Telephone Encounter (Signed)
Called spoke with pt. Made aware she did not qualify for the lung cancer screening program. She is requesting we set up a CT scan for her since she did not qualify. Please advise Dr. Vaughan Browner.

## 2016-01-12 NOTE — Telephone Encounter (Signed)
Order has been placed per PM. Pt is aware. Nothing further was needed.

## 2016-01-12 NOTE — Telephone Encounter (Signed)
OK to order regular CT without contrast. Diagnosis-dyspnea

## 2016-01-19 ENCOUNTER — Encounter: Payer: Self-pay | Admitting: Physical Medicine & Rehabilitation

## 2016-01-20 ENCOUNTER — Ambulatory Visit (INDEPENDENT_AMBULATORY_CARE_PROVIDER_SITE_OTHER)
Admission: RE | Admit: 2016-01-20 | Discharge: 2016-01-20 | Disposition: A | Discharge status: Home / Self Care | Payer: Medicare Other | Source: Ambulatory Visit | Attending: Pulmonary Disease | Admitting: Pulmonary Disease

## 2016-01-20 DIAGNOSIS — R0600 Dyspnea, unspecified: Secondary | ICD-10-CM | POA: Diagnosis not present

## 2016-01-24 NOTE — Progress Notes (Signed)
Quick Note:  Called spoke with patient, advised of CT results / recs as stated by PM. Pt verbalized her understanding and denied any questions. ______

## 2016-01-26 ENCOUNTER — Other Ambulatory Visit: Payer: Self-pay | Admitting: Physical Medicine & Rehabilitation

## 2016-02-04 ENCOUNTER — Ambulatory Visit (INDEPENDENT_AMBULATORY_CARE_PROVIDER_SITE_OTHER): Payer: Medicare Other | Admitting: Pulmonary Disease

## 2016-02-04 ENCOUNTER — Encounter: Payer: Self-pay | Admitting: Pulmonary Disease

## 2016-02-04 VITALS — BP 112/64 | HR 56 | Ht 66.0 in | Wt 147.6 lb

## 2016-02-04 DIAGNOSIS — J439 Emphysema, unspecified: Secondary | ICD-10-CM

## 2016-02-04 NOTE — Progress Notes (Signed)
Subjective:    Patient ID: Emily Leonard, female    DOB: 10/31/1945, 70 y.o.   MRN: [id number]  HPI  Follow up for evaluation of COPD, night sweats.  Emily Leonard is a 70 year old with past medical history as below. She is an active smoker and continues to smoke about half pack per day. She is a diagnosis of COPD. She has been prescribed Symbicort but is not using it regularly. She denies any respiratory symptoms of cough, wheezing, sputum production, fevers, chills, hemoptysis.  Her chief complaint today is night sweats just been going on for 4 months. This was preceded by hot flashes. She saw her primary care physician Dr. Noberto Retort earlier this year and a workup is in process for anxiety, medication effects, endocrine, metabolic etiology.  DATA: PFTs 06/09/11 FVC 3.21 [106%) FEV1 2.24 (97%)  F/F 70 TLC 115% DLCO 72% Mild airflow obstruction, normal lung volumes, decreased diffusion capacity.  CT chest 01/20/16 No malignancy, centrilobular emphysema, minimal right middle lobe nodularity. Images reviewed  Social History: She smoked half pack per day for 45 years. She continues to smoke.Occasional alcohol use no drug use. She used to work on the [employer] for [employer]. She is currently retired. Denies exposures at work or at home.  Family History: Heart attack-brother, father. Hypertension- father, mother  Past Medical History  Diagnosis Date  . Essential hypertension, benign   . Thyroid disease   . Hyperlipidemia   . GERD (gastroesophageal reflux disease)   . Palpitations   . Chronic pain syndrome   . Insomnia   . TIA (transient ischemic attack)   . Anxiety   . Restless leg syndrome   . Degenerative disc disease   . Degenerative joint disease   . Osteoarthritis      Current outpatient prescriptions:  .  alendronate (FOSAMAX) 70 MG tablet, Take 70 mg by mouth every 7 (seven) days. Saturday, Disp: , Rfl:  .  aspirin EC 81 MG tablet, Take 81 mg by mouth daily., Disp: , Rfl:    .  budesonide-formoterol (SYMBICORT) 160-4.5 MCG/ACT inhaler, Inhale 2 puffs into the lungs daily as needed., Disp: 1 Inhaler, Rfl: 3 .  calcium carbonate (OS-CAL) 600 MG TABS, Take 1,200 mg by mouth 2 (two) times daily with a meal. , Disp: , Rfl:  .  cyanocobalamin 500 MCG tablet, Take by mouth., Disp: , Rfl:  .  diclofenac sodium (VOLTAREN) 1 % GEL, Apply 2 g topically 4 (four) times daily., Disp: 3 Tube, Rfl: 1 .  diltiazem (CARDIZEM) 30 MG tablet, TAKE ONE TABLET BY MOUTH AT BEDTIME, Disp: 90 tablet, Rfl: 2 .  DULoxetine (CYMBALTA) 30 MG capsule, Take 1 capsule (30 mg total) by mouth daily., Disp: 30 capsule, Rfl: 5 .  folic acid (FOLVITE) A999333 MCG tablet, Take by mouth., Disp: , Rfl:  .  GAVILYTE-N WITH FLAVOR PACK 420 G solution, , Disp: , Rfl:  .  hydrochlorothiazide 25 MG tablet, Take 25 mg by mouth daily.  , Disp: , Rfl:  .  levothyroxine (SYNTHROID, LEVOTHROID) 112 MCG tablet, Take 112 mcg by mouth daily.  , Disp: , Rfl:  .  Omega-3 Fatty Acids (FISH OIL) 1000 MG CAPS, Take 1,000 mg by mouth 1 day or 1 dose.  , Disp: , Rfl:  .  pravastatin (PRAVACHOL) 40 MG tablet, , Disp: , Rfl: 0 .  pregabalin (LYRICA) 50 MG capsule, Take 50 mg by mouth daily. , Disp: , Rfl:  .  PROCTOSOL HC 2.5 % rectal  cream, , Disp: , Rfl:  .  traMADol (ULTRAM) 50 MG tablet, Take 1 tablet (50 mg total) by mouth 2 (two) times daily., Disp: 60 tablet, Rfl: 1 .  traZODone (DESYREL) 150 MG tablet, Take 1 tablet by mouth at bedtime., Disp: , Rfl: 0 .  PROAIR HFA 108 (90 BASE) MCG/ACT inhaler, Reported on 12/31/2015, Disp: , Rfl: 0  Review of Systems Night sweats, flushing. Denies any cough, sputum production, hemoptysis, dyspnea, wheezing No fevers, chills, malaise, loss of weight, loss of appetite. No chest pain, palpitation. All other review of systems negative.    Objective:   Physical Exam Blood pressure 112/64, pulse 56, height 5\' 6"  (1.676 m), weight 147 lb 9.6 oz (66.951 kg), SpO2 96 %. Gen: No apparent  distress Neuro: No gross focal deficits. Neck: No JVD, lymphadenopathy, thyromegaly. RS: Clear, No wheeze or crackles CVS: S1-S2 heard, no murmurs rubs gallops. Abdomen: Soft, positive bowel sounds. Extremities: No edema.    Assessment & Plan:  #1 COPD She has mild COPD based on PFTs from 2012. She is stable on symbicort and currently does not have any respiratory symptoms of cough, sputum production, dyspnea, wheezing.  #2 Active smoker. She has quit 2 days ago after making a pact with her younger sister. I congratulated her and encouraged her to stay off smoking.     #3 Night sweats. I don't believe this is related to her lungs. She does not have any symptoms suggestive of infectious etiology, TB and there are no risk factors. CT only shows some minimal RML nodularity that is partly calcified and likely benign.  Plan: - Continue symbicort  Return in 6 months.   Marshell Garfinkel MD [facility] Pager [phone] If no answer or after 3pm call: [phone] 02/04/2016, 10:09 AM

## 2016-02-04 NOTE — Patient Instructions (Signed)
Continue using Symbicort as prescribed.  Return to clinic in 6 months.

## 2016-02-17 ENCOUNTER — Ambulatory Visit (HOSPITAL_BASED_OUTPATIENT_CLINIC_OR_DEPARTMENT_OTHER): Payer: Medicare Other | Admitting: Physical Medicine & Rehabilitation

## 2016-02-17 ENCOUNTER — Encounter: Payer: Self-pay | Admitting: Physical Medicine & Rehabilitation

## 2016-02-17 ENCOUNTER — Encounter: Payer: Medicare Other | Attending: Physical Medicine & Rehabilitation

## 2016-02-17 VITALS — BP 132/70 | HR 55 | Resp 14

## 2016-02-17 DIAGNOSIS — M7061 Trochanteric bursitis, right hip: Secondary | ICD-10-CM | POA: Diagnosis not present

## 2016-02-17 DIAGNOSIS — M533 Sacrococcygeal disorders, not elsewhere classified: Secondary | ICD-10-CM

## 2016-02-17 DIAGNOSIS — M7062 Trochanteric bursitis, left hip: Secondary | ICD-10-CM | POA: Diagnosis not present

## 2016-02-17 MED ORDER — TRAMADOL HCL 50 MG PO TABS: 50.0000 mg | ORAL_TABLET | Freq: Two times a day (BID) | ORAL | Status: DC

## 2016-02-17 NOTE — Patient Instructions (Signed)
Sacroiliac injection was performed today. A combination of a naming medicine plus a cortisone medicine was injected. The injection was done under x-ray guidance. This procedure has been performed to help reduce low back and buttocks pain as well as potentially hip pain. The duration of this injection is variable lasting from hours to  Months. It may repeated if needed. 

## 2016-02-17 NOTE — Progress Notes (Signed)
  PROCEDURE RECORD [facility]   Name: Emily Leonard DOB:Sep 29, 1946 MRN: [id number]  Date:02/17/2016  Physician: Alysia Penna, MD    Nurse/CMA: Gara Kroner, CMA  Allergies:  Allergies  Allergen Reactions  . Erythromycin     REACTION: Swelling  . Latex     REACTION: Blisters  . Morphine Sulfate     REACTION: Halucinations/deep sleep  . Clozapine Rash  . Fentanyl Nausea Only    "Feels like you are a hundred miles away when talking to her"    Consent Signed: Yes.    Is patient diabetic? No.  CBG today? NA  Pregnant: No. LMP: No LMP recorded. Patient has had a hysterectomy. (age 60-55)  Anticoagulants: no Anti-inflammatory: no Antibiotics: no  Procedure: Bilateral Sacroiliac Steroid Injection Position: Prone Start Time: 0913 End Time: [id number] Fluoro Time: 17  RN/CMA [employer] ,CMA [employer], CMA    Time [id number]    BP 132/70 144/72    Pulse 55 60    Respirations 14 14    O2 Sat 99 98    S/S 6 6    Pain Level 8/10 7/10     D/C home with husband, [name], D/C instructions reviewed, and sits independently.

## 2016-02-17 NOTE — Progress Notes (Signed)

## 2016-03-29 ENCOUNTER — Telehealth: Payer: Self-pay | Admitting: Physical Medicine & Rehabilitation

## 2016-03-29 NOTE — Telephone Encounter (Signed)
Please make patient an appointment to be seen

## 2016-03-29 NOTE — Telephone Encounter (Signed)
Patient having increased pain and Tramadol is not working.  She is also taking 2 Aleve with it.  Patient needs to know what else she can do.

## 2016-03-30 ENCOUNTER — Ambulatory Visit (HOSPITAL_BASED_OUTPATIENT_CLINIC_OR_DEPARTMENT_OTHER): Payer: Medicare Other | Admitting: Physical Medicine & Rehabilitation

## 2016-03-30 ENCOUNTER — Encounter: Payer: Self-pay | Admitting: Physical Medicine & Rehabilitation

## 2016-03-30 ENCOUNTER — Encounter: Payer: Medicare Other | Attending: Physical Medicine & Rehabilitation

## 2016-03-30 VITALS — BP 147/79 | HR 58 | Resp 14

## 2016-03-30 DIAGNOSIS — M7061 Trochanteric bursitis, right hip: Secondary | ICD-10-CM

## 2016-03-30 DIAGNOSIS — M7062 Trochanteric bursitis, left hip: Secondary | ICD-10-CM | POA: Insufficient documentation

## 2016-03-30 DIAGNOSIS — M961 Postlaminectomy syndrome, not elsewhere classified: Secondary | ICD-10-CM | POA: Diagnosis not present

## 2016-03-30 NOTE — Patient Instructions (Signed)
Patient will need a follow-up appointment with Dr. Arnoldo Morale, To evaluate whether there is evidence of degeneration in the lumbar spine either at L5-S1 or at L1-L2

## 2016-03-30 NOTE — Progress Notes (Signed)
Subjective:    Patient ID: Emily Leonard, female    DOB: [birth year], 70 y.o.   MRN: [id number]  HPI Primary complaint of bilateral hip pain Patient with history of trochanteric bursitis. Her last injection was in February 2017. She has recently undergone sacroiliac injections under fluoroscopic guidance in May 2017 however these were not very helpful. No falls or new issues going on. She has some cramping, this is when she lays down. Prior history of L2-L5 fusions performed by Dr. Arnoldo Morale sometime in 2007. She denies any numbness or tingling in her legs. She has no weakness. She does have cramping sensation mainly in the thighs but also in the calves. This is not with walking but with laying down. Pain Inventory Average Pain 10 Pain Right Now 10 My pain is stabbing and aching  In the last 24 hours, has pain interfered with the following? General activity 10 Relation with others 10 Enjoyment of life 10 What TIME of day is your pain at its worst? all Sleep (in general) Poor  Pain is worse with: unsure Pain improves with: medication Relief from Meds: 0  Mobility walk without assistance do you drive?  yes Do you have any goals in this area?  yes  Function Do you have any goals in this area?  yes  Neuro/Psych No problems in this area  Prior Studies Any changes since last visit?  no  Physicians involved in your care Any changes since last visit?  no   Family History  Problem Relation Age of Onset  . Hypertension Mother   . Hypertension Father   . Heart attack Father 57  . Heart attack Brother 70   Social History   Social History  . Marital Status: Married    Spouse Name: N/A  . Number of Children: 4  . Years of Education: N/A   Social History Main Topics  . Smoking status: Current Every Day Smoker -- 30 years    Types: Cigarettes  . Smokeless tobacco: Never Used     Comment: 8 cigs daily  . Alcohol Use: 0.6 oz/week    1 Standard drinks or equivalent per  week  . Drug Use: No  . Sexual Activity: Not Asked   Other Topics Concern  . None   Social History Narrative   Past Surgical History  Procedure Laterality Date  . Abdominal hysterectomy    . Cataract extraction, bilateral    . Lumbar spine surgery      x 3  . Appendectomy    . Hemorrhoid surgery  03/2013  . Colonoscopy  02/2013  . Cervical spine surgery     Past Medical History  Diagnosis Date  . Essential hypertension, benign   . Thyroid disease   . Hyperlipidemia   . GERD (gastroesophageal reflux disease)   . Palpitations   . Chronic pain syndrome   . Insomnia   . TIA (transient ischemic attack)   . Anxiety   . Restless leg syndrome   . Degenerative disc disease   . Degenerative joint disease   . Osteoarthritis    BP 147/79 mmHg  Pulse 58  Resp 14  SpO2 93%  Opioid Risk Score:   Fall Risk Score:  `1  Depression screen PHQ 2/9  Depression screen PHQ 2/9 12/31/2014  Decreased Interest 3  Down, Depressed, Hopeless 0  PHQ - 2 Score 3  Altered sleeping 0  Tired, decreased energy 1  Change in appetite 0  Feeling bad or failure about  yourself  0  Trouble concentrating 0  Moving slowly or fidgety/restless 0  Suicidal thoughts 0  PHQ-9 Score 4     Review of Systems  All other systems reviewed and are negative.      Objective:   Physical Exam  Constitutional: She is oriented to person, place, and time. She appears well-developed and well-nourished.  HENT:  Head: Normocephalic and atraumatic.  Eyes: Conjunctivae and EOM are normal. Pupils are equal, round, and reactive to light.  Neck:  Well-healed posterior cervical incision  Musculoskeletal:       Right hip: She exhibits bony tenderness.       Left hip: She exhibits bony tenderness.       Lumbar back: She exhibits decreased range of motion and deformity. She exhibits no tenderness and no spasm.  Negative straight leg raising test  Neurological: She is alert and oriented to person, place, and time.  She has normal strength. Coordination normal.  Reflex Scores:      Patellar reflexes are 2+ on the right side and 2+ on the left side.      Achilles reflexes are 2+ on the right side and 2+ on the left side. Ambulates without evidence of toe drag or knee instability.  Psychiatric: She has a normal mood and affect.  Nursing note and vitals reviewed.   Deep tendon reflexes are normal Sensation normal L2-L3 L4 L5 S1 dermatomes Tenderness to palpation bilateral trochanteric bursa      Assessment & Plan:  1. Bilateral trochanteric bursitis reach she does get good relief with injections. She can have these every 3 months. I do not think her hip pain is related to sacroiliac discomfort as she did not get a good result with the sacroiliac injections.  Bilateral Trochanteric bursa injection without ultrasound guidance  Indication Trochanteric bursitis. Exam has tenderness over the greater trochanter of the hip. Pain has not responded to conservative care such as exercise therapy and oral medications. Pain interferes with sleep or with mobility Informed consent was obtained after describing risks and benefits of the procedure with the patient these include bleeding bruising and infection. Patient has signed written consent form. Patient placed in a lateral decubitus position with the affected hip superior. Point of maximal pain was palpated marked and prepped with Betadine and entered with a needle to bone contact. Needle slightly withdrawn then 6mg  of betamethasone with 4 cc 1% lidocaine were injected. Patient tolerated procedure well. Post procedure instructions given. This was performed first on the right side than the patient was flipped and the left side was performed  2. Lumbar post laminectomy syndrome. She is starting get some cramping in the lower extremities. I'm suspicious that she may be developing some adjacent level degeneration either at L5-S1 or at L1-L2. Have instructed the patient  to get reevaluation with her neurosurgeon, Dr. Arnoldo Morale.

## 2016-05-01 ENCOUNTER — Telehealth: Payer: Self-pay | Admitting: Physical Medicine & Rehabilitation

## 2016-05-01 NOTE — Telephone Encounter (Signed)
Patient is calling because [facility] wanted her to call her surgeon and not the surgeon is wanting the xrays she has had

## 2016-05-08 ENCOUNTER — Telehealth: Payer: Self-pay | Admitting: Physical Medicine & Rehabilitation

## 2016-05-08 NOTE — Telephone Encounter (Signed)
Patient was referred to Dr. Arnoldo Morale office and she would like to know if the Xrays or MRI's were sent to his office.  Please call patient at [phone].

## 2016-05-09 ENCOUNTER — Other Ambulatory Visit: Payer: Self-pay | Admitting: Cardiology

## 2016-05-23 ENCOUNTER — Ambulatory Visit: Payer: Medicare Other | Admitting: Physical Medicine & Rehabilitation

## 2016-05-24 ENCOUNTER — Other Ambulatory Visit: Payer: Self-pay | Admitting: Physical Medicine & Rehabilitation

## 2016-06-09 ENCOUNTER — Encounter: Payer: Self-pay | Admitting: Cardiology

## 2016-06-21 NOTE — Progress Notes (Signed)
HPI The patient presents for follow up of chest discomfort and palpitations. I saw her previously for this. She's had a stress test in 2015 which was unremarkable.   She has done well except for back and joint pain.  She still stays active and takes care of a 71 year old great grandchild.  She has rare palpitations. The patient denies any new symptoms such as chest discomfort, neck or arm discomfort. There has been no new shortness of breath, PND or orthopnea. There has been no presyncope or syncope.  Allergies  Allergen Reactions  . Erythromycin     REACTION: Swelling  . Latex     REACTION: Blisters  . Morphine Sulfate     REACTION: Halucinations/deep sleep  . Clozapine Rash  . Fentanyl Nausea Only    "Feels like you are a hundred miles away when talking to her"    Current Outpatient Prescriptions  Medication Sig Dispense Refill  . alendronate (FOSAMAX) 70 MG tablet Take 70 mg by mouth every 7 (seven) days. Saturday    . aspirin EC 81 MG tablet Take 81 mg by mouth daily.    . budesonide-formoterol (SYMBICORT) 160-4.5 MCG/ACT inhaler Inhale 2 puffs into the lungs daily as needed. 1 Inhaler 3  . calcium carbonate (OS-CAL) 600 MG TABS Take 1,200 mg by mouth 2 (two) times daily with a meal.     . cyanocobalamin 500 MCG tablet Take by mouth.    . diclofenac sodium (VOLTAREN) 1 % GEL Apply 2 g topically 4 (four) times daily. 3 Tube 1  . diltiazem (CARDIZEM) 30 MG tablet TAKE ONE TABLET BY MOUTH AT BEDTIME 90 tablet 0  . DULoxetine (CYMBALTA) 30 MG capsule Take 1 capsule (30 mg total) by mouth daily. 30 capsule 5  . folic acid (FOLVITE) A999333 MCG tablet Take by mouth.    Marland Kitchen GAVILYTE-N WITH FLAVOR PACK 420 G solution     . hydrochlorothiazide 25 MG tablet Take 25 mg by mouth daily.      Marland Kitchen levothyroxine (SYNTHROID, LEVOTHROID) 112 MCG tablet Take 112 mcg by mouth daily.      . Omega-3 Fatty Acids (FISH OIL) 1000 MG CAPS Take 1,000 mg by mouth 1 day or 1 dose.      . pravastatin (PRAVACHOL) 40  MG tablet   0  . pregabalin (LYRICA) 50 MG capsule Take 50 mg by mouth daily.     Marland Kitchen PROCTOSOL HC 2.5 % rectal cream     . traMADol (ULTRAM) 50 MG tablet TAKE ONE TABLET BY MOUTH TWICE DAILY 60 tablet 2  . traZODone (DESYREL) 150 MG tablet Take 1 tablet by mouth at bedtime.  0   No current facility-administered medications for this visit.     Past Medical History:  Diagnosis Date  . Anxiety   . Chronic pain syndrome   . Degenerative disc disease   . Degenerative joint disease   . Essential hypertension, benign   . GERD (gastroesophageal reflux disease)   . Hyperlipidemia   . Insomnia   . Osteoarthritis   . Palpitations   . Restless leg syndrome   . Thyroid disease   . TIA (transient ischemic attack)     Past Surgical History:  Procedure Laterality Date  . ABDOMINAL HYSTERECTOMY    . APPENDECTOMY    . CATARACT EXTRACTION, BILATERAL    . CERVICAL SPINE SURGERY    . COLONOSCOPY  02/2013  . HEMORRHOID SURGERY  03/2013  . LUMBAR SPINE SURGERY  x 3    ROS:  Left shoulder pain, HOH.  Otherwise, as stated in the HPI and negative for all other systems.   PHYSICAL EXAM BP 122/78   Pulse (!) 56   Ht 5\' 4"  (1.626 m)   Wt 148 lb 6.4 oz (67.3 kg)   BMI 25.47 kg/m  GENERAL:  Well appearing NECK:  No jugular venous distention, waveform within normal limits, carotid upstroke brisk and symmetric, no bruits, no thyromegaly LUNGS:  Clear to auscultation bilaterally BACK:  No CVA tenderness CHEST:  Unremarkable HEART:  PMI not displaced or sustained,S1 and S2 within normal limits, no S3, no S4, no clicks, no rubs, no murmurs ABD:  Flat, positive bowel sounds normal in frequency in pitch, no bruits, no rebound, no guarding, no midline pulsatile mass, no hepatomegaly, no splenomegaly EXT:  2 plus pulses throughout, no edema, no cyanosis no clubbing   EKG:  Sinus rhythm, rate 56, left axis deviation, poor anterior R wave progression, no acute ST-T wave changes.  Unchanged from  previous.    06/23/2016   ASSESSMENT AND PLAN  PALPITATIONS:  These are improved with Cardizem. No change in therapy is indicated.  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 However, this was mild and I am not planning follow up at this time.   HTN:  Her blood pressure is controlled. She will continue the meds as listed.

## 2016-06-23 ENCOUNTER — Encounter: Payer: Self-pay | Admitting: Cardiology

## 2016-06-23 ENCOUNTER — Ambulatory Visit (INDEPENDENT_AMBULATORY_CARE_PROVIDER_SITE_OTHER): Payer: Medicare Other | Admitting: Cardiology

## 2016-06-23 VITALS — BP 122/78 | HR 56 | Ht 64.0 in | Wt 148.4 lb

## 2016-06-23 DIAGNOSIS — I10 Essential (primary) hypertension: Secondary | ICD-10-CM | POA: Diagnosis not present

## 2016-06-23 DIAGNOSIS — R002 Palpitations: Secondary | ICD-10-CM

## 2016-06-23 NOTE — Patient Instructions (Signed)

## 2016-06-27 ENCOUNTER — Ambulatory Visit: Payer: Medicare Other | Admitting: Physical Medicine & Rehabilitation

## 2016-07-11 ENCOUNTER — Ambulatory Visit: Payer: Medicare Other | Admitting: Physical Medicine & Rehabilitation

## 2016-07-18 ENCOUNTER — Encounter: Payer: Medicare Other | Attending: Physical Medicine & Rehabilitation

## 2016-07-18 ENCOUNTER — Ambulatory Visit (HOSPITAL_BASED_OUTPATIENT_CLINIC_OR_DEPARTMENT_OTHER): Payer: Medicare Other | Admitting: Physical Medicine & Rehabilitation

## 2016-07-18 DIAGNOSIS — M7061 Trochanteric bursitis, right hip: Secondary | ICD-10-CM | POA: Diagnosis not present

## 2016-07-18 DIAGNOSIS — M7062 Trochanteric bursitis, left hip: Secondary | ICD-10-CM | POA: Insufficient documentation

## 2016-07-18 NOTE — Patient Instructions (Signed)
We can do bilateral Sacroiliac injections next month

## 2016-07-18 NOTE — Progress Notes (Signed)
Bilateral Trochanteric bursa injection With ultrasound guidance  Indication Trochanteric bursitis. Exam has tenderness over the greater trochanter of the hip. Pain has not responded to conservative care such as exercise therapy and oral medications. Pain interferes with sleep or with mobility. She did not respond to palpation guided greater trochanteric bursa injections performed 3 months ago. Because of this ultrasound guidance is utilized Informed consent was obtained after describing risks and benefits of the procedure with the patient these include bleeding bruising and infection. Patient has signed written consent form. Patient placed in a lateral decubitus position with the affected hip superior. The greater trochanter was visualized with target on the posterior facet. 25-gauge 1.5 inch needle was used to anesthetize the skin and subcutaneous tissue with 1 cc 1% lidocaine. Then a 80 mm Echo block needle was inserted under direct ultrasound visualization targeting the posterior facet of the greater trochanter. Needle slightly withdrawn then 6mg  of betamethasone with 4 cc 1% lidocaine were injected. Left side was performed first, than the right side was performed Patient tolerated procedure well. Post procedure instructions given.

## 2016-08-04 ENCOUNTER — Ambulatory Visit (HOSPITAL_BASED_OUTPATIENT_CLINIC_OR_DEPARTMENT_OTHER): Payer: Medicare Other | Admitting: Physical Medicine & Rehabilitation

## 2016-08-04 ENCOUNTER — Encounter: Payer: Self-pay | Admitting: Physical Medicine & Rehabilitation

## 2016-08-04 VITALS — BP 112/74 | HR 65 | Resp 14

## 2016-08-04 DIAGNOSIS — M533 Sacrococcygeal disorders, not elsewhere classified: Secondary | ICD-10-CM | POA: Diagnosis not present

## 2016-08-04 DIAGNOSIS — M7061 Trochanteric bursitis, right hip: Secondary | ICD-10-CM | POA: Diagnosis not present

## 2016-08-04 MED ORDER — TRAMADOL HCL 50 MG PO TABS: 50.0000 mg | ORAL_TABLET | Freq: Two times a day (BID) | ORAL | 2 refills | Status: DC

## 2016-08-04 NOTE — Progress Notes (Addendum)
  PROCEDURE RECORD [facility]   Name: Emily Leonard DOB:[birth year] MRN: [id number]  Date:08/04/2016  Physician: Alysia Penna, MD    Nurse/CMA: Rhyatt Muska, CMA  Allergies:  Allergies  Allergen Reactions  . Erythromycin     REACTION: Swelling  . Latex     REACTION: Blisters  . Morphine Sulfate     REACTION: Halucinations/deep sleep  . Clozapine Rash  . Fentanyl Nausea Only    "Feels like you are a hundred miles away when talking to her"    Consent Signed: Yes.    Is patient diabetic? No.  CBG today?   Pregnant: No. LMP: No LMP recorded. Patient has had a hysterectomy. (age 45-55)  Anticoagulants: no Anti-inflammatory: no Antibiotics: no  Procedure: bilateral sacroiliac steroid injection  Position: Prone Start Time: 2:33pm  End Time: 2:46pm  Fluoro Time: 31  RN/CMA Marielle Mantione, CMA Colon Rueth, CMA    Time 2:00pm 2:50pm    BP 112/74 137/79    Pulse 65 66    Respirations 14 14    O2 Sat 91 90    S/S 6 6    Pain Level 9/10 4/10     D/C home with husband, [name], D/C instructions reviewed, and sits independently.

## 2016-08-04 NOTE — Patient Instructions (Signed)
Sacroiliac injection was performed today. A combination of a naming medicine plus a cortisone medicine was injected. The injection was done under x-ray guidance. This procedure has been performed to help reduce low back and buttocks pain as well as potentially hip pain. The duration of this injection is variable lasting from hours to  Months. It may repeated if needed. 

## 2016-08-04 NOTE — Progress Notes (Signed)

## 2016-08-09 ENCOUNTER — Other Ambulatory Visit: Payer: Self-pay | Admitting: Cardiology

## 2016-08-17 ENCOUNTER — Ambulatory Visit: Payer: Medicare Other | Admitting: Physical Medicine & Rehabilitation

## 2016-08-23 ENCOUNTER — Other Ambulatory Visit: Payer: Self-pay | Admitting: Physical Medicine & Rehabilitation

## 2016-11-10 ENCOUNTER — Encounter: Payer: Medicare Other | Attending: Physical Medicine & Rehabilitation

## 2016-11-10 ENCOUNTER — Ambulatory Visit (HOSPITAL_BASED_OUTPATIENT_CLINIC_OR_DEPARTMENT_OTHER): Payer: Medicare Other | Admitting: Physical Medicine & Rehabilitation

## 2016-11-10 ENCOUNTER — Encounter: Payer: Self-pay | Admitting: Physical Medicine & Rehabilitation

## 2016-11-10 DIAGNOSIS — M533 Sacrococcygeal disorders, not elsewhere classified: Secondary | ICD-10-CM

## 2016-11-10 DIAGNOSIS — Z881 Allergy status to other antibiotic agents status: Secondary | ICD-10-CM | POA: Insufficient documentation

## 2016-11-10 DIAGNOSIS — Z91040 Latex allergy status: Secondary | ICD-10-CM | POA: Diagnosis not present

## 2016-11-10 DIAGNOSIS — Z885 Allergy status to narcotic agent status: Secondary | ICD-10-CM | POA: Diagnosis not present

## 2016-11-10 DIAGNOSIS — Z888 Allergy status to other drugs, medicaments and biological substances status: Secondary | ICD-10-CM | POA: Insufficient documentation

## 2016-11-10 MED ORDER — TRAMADOL HCL 50 MG PO TABS: 50.0000 mg | ORAL_TABLET | Freq: Two times a day (BID) | ORAL | 2 refills | Status: DC

## 2016-11-10 NOTE — Progress Notes (Signed)
  PROCEDURE RECORD [facility]   Name: Emily Leonard DOB:[birth year] MRN: [id number]  Date:11/10/2016  Physician: Alysia Penna, MD    Nurse/CMA: Bright, CMA  Allergies:  Allergies  Allergen Reactions  . Erythromycin     REACTION: Swelling  . Latex     REACTION: Blisters  . Morphine Sulfate     REACTION: Halucinations/deep sleep  . Clozapine Rash  . Fentanyl Nausea Only    "Feels like you are a hundred miles away when talking to her"    Consent Signed: Yes.    Is patient diabetic? No.  CBG today?   Pregnant: No. LMP: No LMP recorded. Patient has had a hysterectomy. (age 45-55)  Anticoagulants: no Anti-inflammatory: no Antibiotics: no  Procedure: bilateral sacroiliac steroid injection  Position: Prone Start Time:1041am  End Time:1049am Fluoro Time:18s RN/CMA Arel Tippen, CMA Bright, CMA    Time 10:00am 1050am    BP 134/80 134/81    Pulse 61 67    Respirations 14 14    O2 Sat 96 95    S/S 6 6    Pain Level 9/10 7     D/C home with husband, [name], D/C instructions reviewed, and sits independently.

## 2016-11-10 NOTE — Progress Notes (Signed)

## 2016-11-10 NOTE — Patient Instructions (Signed)
Sacroiliac injection was performed today. A combination of a naming medicine plus a cortisone medicine was injected. The injection was done under x-ray guidance. This procedure has been performed to help reduce low back and buttocks pain as well as potentially hip pain. The duration of this injection is variable lasting from hours to  Months. It may repeated if needed.   Please call if the mid back pain is not relieved with this injection, we may need to do another type of injection.

## 2016-11-16 ENCOUNTER — Encounter: Payer: Medicare Other | Attending: Physical Medicine & Rehabilitation

## 2016-11-16 ENCOUNTER — Ambulatory Visit (HOSPITAL_BASED_OUTPATIENT_CLINIC_OR_DEPARTMENT_OTHER): Payer: Medicare Other | Admitting: Physical Medicine & Rehabilitation

## 2016-11-16 ENCOUNTER — Encounter: Payer: Self-pay | Admitting: Physical Medicine & Rehabilitation

## 2016-11-16 VITALS — BP 119/76 | HR 61 | Resp 16

## 2016-11-16 DIAGNOSIS — Z881 Allergy status to other antibiotic agents status: Secondary | ICD-10-CM | POA: Insufficient documentation

## 2016-11-16 DIAGNOSIS — Z888 Allergy status to other drugs, medicaments and biological substances status: Secondary | ICD-10-CM | POA: Insufficient documentation

## 2016-11-16 DIAGNOSIS — M5417 Radiculopathy, lumbosacral region: Secondary | ICD-10-CM | POA: Diagnosis not present

## 2016-11-16 DIAGNOSIS — Z885 Allergy status to narcotic agent status: Secondary | ICD-10-CM | POA: Insufficient documentation

## 2016-11-16 DIAGNOSIS — M533 Sacrococcygeal disorders, not elsewhere classified: Secondary | ICD-10-CM | POA: Insufficient documentation

## 2016-11-16 DIAGNOSIS — M961 Postlaminectomy syndrome, not elsewhere classified: Secondary | ICD-10-CM

## 2016-11-16 DIAGNOSIS — Z91040 Latex allergy status: Secondary | ICD-10-CM | POA: Diagnosis not present

## 2016-11-16 NOTE — Patient Instructions (Signed)
Heat 20-30 minutes every 2 hours as needed. May use Biofreeze for muscle pain on the left side

## 2016-11-16 NOTE — Progress Notes (Signed)
Subjective:    Patient ID: Emily Leonard, female    DOB: [birth year], 71 y.o.   MRN: [id number]  HPI  Patient with chief complaint of mid back pain.No weakness or numbness in the lower extremities. No bowel or bladder dysfunction She underwent sacroiliac injection on 11/10/2016 for her low back and buttock pain.Her low back and buttock is not really hurting her at the current time. Patient denies any falls or trauma to the mid back area. Onset of this pain is just a couple days ago. Review of chart, had a left L1-L2 transforaminal epidural injection on 11/04/2010. Patient does not recall this injection very well or the results of it. Pain Inventory Average Pain 9 Pain Right Now 9 My pain is sharp  In the last 24 hours, has pain interfered with the following? General activity 9 Relation with others 8 Enjoyment of life 10 What TIME of day is your pain at its worst? all Sleep (in general) Fair  Pain is worse with: walking, bending, sitting, inactivity, standing and some activites Pain improves with: rest and medication Relief from Meds: 0  Mobility walk without assistance how many minutes can you walk? 35 ability to climb steps?  no do you drive?  no  Function retired I need assistance with the following:  household duties  Neuro/Psych No problems in this area  Prior Studies Any changes since last visit?  no Clinical Data:  Lumbar fusion.  Back pain with bilateral hip pain  MYELOGRAM LUMBAR  Technique: Lumbar puncture was performed at L5-S1 by Dr. Arnoldo Morale. Following injection of intrathecal Omnipaque contrast, spine imaging in multiple projections was performed using fluoroscopy.  Fluoroscopy Time: 2.0 minutes.  Comparison:  Lumbar MRI 12/12/2011  Findings: Trans pedicle and posterior rod fusion L2-L5.  Interbody fusion at L2-3, L3-4, and  L4-5.  No hardware failure or mal positioning is identified.  No significant spinal stenosis from L2 through the sacrum.   No significant stenosis at L5-S1.  Mild to moderate stenosis at L1-2, above the fusion.  There is disc degeneration and spondylosis at this level as well as posterior element hypertrophy.  Mild posterior slip L1-2 which moves slightly on flexion.  Movement  measures approximately 2 mm.  IMPRESSION: Satisfactory fusion L2-L5 without stenosis.  Mild to moderate stenosis at L1-2 with mild movement at this level on flexion.  Original Report Authenticated By: Truett Perna, M.D. Physicians involved in your care Any changes since last visit?  no   Family History  Problem Relation Age of Onset  . Hypertension Mother   . Hypertension Father   . Heart attack Father 58  . Heart attack Brother 23   Social History   Social History  . Marital status: Married    Spouse name: N/A  . Number of children: 4  . Years of education: N/A   Social History Main Topics  . Smoking status: Current Every Day Smoker    Years: 30.00    Types: Cigarettes  . Smokeless tobacco: Never Used     Comment: 8 cigs daily  . Alcohol use 0.6 oz/week    1 Standard drinks or equivalent per week  . Drug use: No  . Sexual activity: Not on file   Other Topics Concern  . Not on file   Social History Narrative  . No narrative on file   Past Surgical History:  Procedure Laterality Date  . ABDOMINAL HYSTERECTOMY    . APPENDECTOMY    . CATARACT EXTRACTION, BILATERAL    .  CERVICAL SPINE SURGERY    . COLONOSCOPY  02/2013  . HEMORRHOID SURGERY  03/2013  . LUMBAR SPINE SURGERY     x 3   Past Medical History:  Diagnosis Date  . Anxiety   . Chronic pain syndrome   . Degenerative disc disease   . Degenerative joint disease   . Essential hypertension, benign   . GERD (gastroesophageal reflux disease)   . Hyperlipidemia   . Insomnia   . Osteoarthritis   . Palpitations   . Restless leg syndrome   . Thyroid disease   . TIA (transient ischemic attack)    BP 119/76 P 61 R 16 o2 sat 95% Opioid Risk  Score:   Fall Risk Score:  `1  Depression screen PHQ 2/9  Depression screen [facility] 2/9 11/16/2016 12/31/2014  Decreased Interest 0 3  Down, Depressed, Hopeless 0 0  PHQ - 2 Score 0 3  Altered sleeping - 0  Tired, decreased energy - 1  Change in appetite - 0  Feeling bad or failure about yourself  - 0  Trouble concentrating - 0  Moving slowly or fidgety/restless - 0  Suicidal thoughts - 0  PHQ-9 Score - 4  Some recent data might be hidden    Review of Systems  Constitutional: Negative.   HENT: Negative.   Eyes: Negative.   Respiratory: Negative.   Cardiovascular: Negative.   Gastrointestinal: Negative.   Endocrine: Negative.   Genitourinary: Negative.   Musculoskeletal: Positive for back pain.  Skin: Negative.   Allergic/Immunologic: Negative.   Neurological: Negative.   Hematological: Negative.   Psychiatric/Behavioral: Negative.   All other systems reviewed and are negative.      Objective:   Physical Exam  Constitutional: She is oriented to person, place, and time. She appears well-developed and well-nourished.  HENT:  Head: Normocephalic and atraumatic.  Eyes: Conjunctivae and EOM are normal. Pupils are equal, round, and reactive to light.  Musculoskeletal:       Lumbar back: She exhibits decreased range of motion, tenderness, deformity and spasm.  Flatback deformity, pain just lateral to the lumbar paraspinals at L1, L2, L3 on the left side only. No pain over PSIS  Neurological: She is alert and oriented to person, place, and time.  Lower extremity strength is 5/5 hip flexor, knee extensor, ankle dorsiflexor  Psychiatric: She has a normal mood and affect.  Nursing note and vitals reviewed.         Assessment & Plan:  Left lumbar paraspinal pain, appears to be muscle spasm. However, given history of L2-L5 fusion and prior imaging studies showing degeneration of L1-L2. Cannot rule out L1 or L2 radicular pain Instructed patient to use heat, 20, 30 minutes every  couple hours, continues bending, gait, or Biofreeze or similar product, 2-3 times per day  May need repeat L1-L2 transforaminal injection on the left side is not much better in 2-3 weeks

## 2016-12-05 ENCOUNTER — Encounter: Payer: Self-pay | Admitting: Physical Medicine & Rehabilitation

## 2016-12-05 ENCOUNTER — Ambulatory Visit (HOSPITAL_BASED_OUTPATIENT_CLINIC_OR_DEPARTMENT_OTHER): Payer: Medicare Other | Admitting: Physical Medicine & Rehabilitation

## 2016-12-05 VITALS — BP 140/85 | HR 58 | Resp 14

## 2016-12-05 DIAGNOSIS — M7062 Trochanteric bursitis, left hip: Secondary | ICD-10-CM

## 2016-12-05 DIAGNOSIS — M533 Sacrococcygeal disorders, not elsewhere classified: Secondary | ICD-10-CM | POA: Diagnosis not present

## 2016-12-05 DIAGNOSIS — M7061 Trochanteric bursitis, right hip: Secondary | ICD-10-CM

## 2016-12-05 NOTE — Patient Instructions (Signed)
We did hip bursa injections today  Next visit is for fluoroscopy guided sacroiliac injections in 2 months, this is for low back and buttock pain

## 2016-12-05 NOTE — Progress Notes (Signed)
Bilateral Trochanteric bursa injection With ultrasound guidance   Indication Trochanteric bursitis. Exam has tenderness over the greater trochanter of the hip. Pain has not responded to conservative care such as exercise therapy and oral medications. Pain interferes with sleep or with mobility. She did not respond to palpation guided greater trochanteric bursa injections.  Last injection performed 4 months ago.  ultrasound guidance is utilized Informed consent was obtained after describing risks and benefits of the procedure with the patient these include bleeding bruising and infection. Patient has signed written consent form. Patient placed in a lateral decubitus position with the affected hip superior. The greater trochanter was visualized with target on the posterior facet. 25-gauge 1.5 inch needle was used to anesthetize the skin and subcutaneous tissue with 1 cc 1% lidocaine. Then a 80 mm Echo block needle was inserted under direct ultrasound visualization targeting the posterior facet of the greater trochanter. Needle slightly withdrawn then 6mg  of betamethasone with 4 cc 1% lidocaine were injected. Left side was performed first, than the right side was performed Patient tolerated procedure well. Post procedure instructions given.

## 2016-12-29 ENCOUNTER — Encounter: Payer: Medicare Other | Attending: Physical Medicine & Rehabilitation

## 2016-12-29 ENCOUNTER — Encounter: Payer: Self-pay | Admitting: Physical Medicine & Rehabilitation

## 2016-12-29 ENCOUNTER — Ambulatory Visit (HOSPITAL_BASED_OUTPATIENT_CLINIC_OR_DEPARTMENT_OTHER): Payer: Medicare Other | Admitting: Physical Medicine & Rehabilitation

## 2016-12-29 VITALS — BP 131/84 | HR 64

## 2016-12-29 DIAGNOSIS — Z79899 Other long term (current) drug therapy: Secondary | ICD-10-CM

## 2016-12-29 DIAGNOSIS — Z885 Allergy status to narcotic agent status: Secondary | ICD-10-CM | POA: Insufficient documentation

## 2016-12-29 DIAGNOSIS — Z5181 Encounter for therapeutic drug level monitoring: Secondary | ICD-10-CM

## 2016-12-29 DIAGNOSIS — Z888 Allergy status to other drugs, medicaments and biological substances status: Secondary | ICD-10-CM | POA: Insufficient documentation

## 2016-12-29 DIAGNOSIS — M5416 Radiculopathy, lumbar region: Secondary | ICD-10-CM

## 2016-12-29 DIAGNOSIS — G894 Chronic pain syndrome: Secondary | ICD-10-CM

## 2016-12-29 DIAGNOSIS — M533 Sacrococcygeal disorders, not elsewhere classified: Secondary | ICD-10-CM | POA: Diagnosis present

## 2016-12-29 DIAGNOSIS — Z881 Allergy status to other antibiotic agents status: Secondary | ICD-10-CM | POA: Diagnosis not present

## 2016-12-29 DIAGNOSIS — Z91040 Latex allergy status: Secondary | ICD-10-CM | POA: Diagnosis not present

## 2016-12-29 MED ORDER — TRAMADOL HCL 50 MG PO TABS: 50.0000 mg | ORAL_TABLET | Freq: Two times a day (BID) | ORAL | 2 refills | Status: DC

## 2016-12-29 NOTE — Progress Notes (Signed)
  PROCEDURE RECORD [facility]   Name: Emily Leonard DOB:1945/11/15 MRN: [id number]  Date:12/29/2016  Physician: Alysia Penna, MD    Nurse/CMA: Osiris Charles CMA  Allergies:  Allergies  Allergen Reactions  . Erythromycin     REACTION: Swelling  . Latex     REACTION: Blisters  . Morphine Sulfate     REACTION: Halucinations/deep sleep  . Clozapine Rash  . Fentanyl Nausea Only    "Feels like you are a hundred miles away when talking to her"    Consent Signed: Yes.    Is patient diabetic? No.  CBG today? No.  Pregnant: No. LMP: No LMP recorded. Patient has had a hysterectomy. (age 82-55)  Anticoagulants: no Anti-inflammatory: no Antibiotics: no  Procedure: left L1-2 Transforaminal injections  Position: Prone Start Time: 952am End Time:1000  Fluoro Time:29s RN/CMA Ryane Canavan CMA Sonya Gunnoe CMA    Time 920am 1000    BP 131/84 127/90    Pulse 63 63    Respirations 14 14    O2 Sat 95 94    S/S 6 6    Pain Level 9/10 5/10     D/C home with husband ronnie, [name], D/C instructions reviewed, and sits independently.

## 2016-12-29 NOTE — Progress Notes (Signed)
Left L1-2 Lumbar transforaminal epidural steroid injection under fluoroscopic guidance  Indication: Lumbosacral radiculitis is not relieved by medication management or other conservative care and interfering with self-care and mobility.   Informed consent was obtained after describing risk and benefits of the procedure with the patient, this includes bleeding, bruising, infection, paralysis and medication side effects.  The patient wishes to proceed and has given written consent.  Patient was placed in prone position.  The lumbar area was marked and prepped with Betadine.  It was entered with a 25-gauge 1-1/2 inch needle and one mL of 1% lidocaine was injected into the skin and subcutaneous tissue.  Then a 25-gauge 3.5" spinal needle was inserted into the Left L1-2 intervertebral foramen under AP, lateral, and oblique view.  Then a solution containing one mL of 10 mg per mL dexamethasone and 2 mL of 1% lidocaine was injected.  The patient tolerated procedure well.  Post procedure instructions were given.  Please see post procedure form.

## 2016-12-29 NOTE — Patient Instructions (Signed)

## 2017-01-03 LAB — TOXASSURE SELECT,+ANTIDEPR,UR

## 2017-01-15 ENCOUNTER — Ambulatory Visit: Payer: Medicare Other | Admitting: Physical Medicine & Rehabilitation

## 2017-02-02 ENCOUNTER — Ambulatory Visit: Payer: Medicare Other | Admitting: Physical Medicine & Rehabilitation

## 2017-02-02 ENCOUNTER — Encounter: Payer: Self-pay | Admitting: Physical Medicine & Rehabilitation

## 2017-02-02 ENCOUNTER — Ambulatory Visit (HOSPITAL_BASED_OUTPATIENT_CLINIC_OR_DEPARTMENT_OTHER): Payer: Medicare Other | Admitting: Physical Medicine & Rehabilitation

## 2017-02-02 ENCOUNTER — Encounter: Payer: Medicare Other | Attending: Physical Medicine & Rehabilitation

## 2017-02-02 ENCOUNTER — Telehealth: Payer: Self-pay | Admitting: *Deleted

## 2017-02-02 DIAGNOSIS — M533 Sacrococcygeal disorders, not elsewhere classified: Secondary | ICD-10-CM | POA: Diagnosis not present

## 2017-02-02 DIAGNOSIS — Z91040 Latex allergy status: Secondary | ICD-10-CM | POA: Diagnosis not present

## 2017-02-02 DIAGNOSIS — Z888 Allergy status to other drugs, medicaments and biological substances status: Secondary | ICD-10-CM | POA: Insufficient documentation

## 2017-02-02 DIAGNOSIS — Z881 Allergy status to other antibiotic agents status: Secondary | ICD-10-CM | POA: Diagnosis not present

## 2017-02-02 DIAGNOSIS — Z885 Allergy status to narcotic agent status: Secondary | ICD-10-CM | POA: Diagnosis not present

## 2017-02-02 MED ORDER — PREGABALIN 50 MG PO CAPS: 50.0000 mg | ORAL_CAPSULE | Freq: Three times a day (TID) | ORAL | 1 refills | Status: DC

## 2017-02-02 NOTE — Progress Notes (Signed)

## 2017-02-02 NOTE — Telephone Encounter (Signed)
Urine drug screen for this encounter is consistent for prescribed medication. Cymbalta is not present but is not controlled and she may not be taking currently.

## 2017-02-02 NOTE — Patient Instructions (Addendum)
Cannot do any further injections for 3 months Will try a new longer acting steroid medication next time but need insurance to approve it, will use for hips  Did sacroiliac injections for back and buttocks pain  Too early to repeat hip injection   Increase Lyrica (pregabalin) to 3 times a day, should help with Left heel pain

## 2017-02-02 NOTE — Progress Notes (Signed)
  PROCEDURE RECORD [facility]   Name: Emily Leonard DOB:[birth year] MRN: [id number]  Date:02/02/2017  Physician: Alysia Penna, MD    Nurse/CMA: Chrisopher Pustejovsky, CMA  Allergies:  Allergies  Allergen Reactions  . Erythromycin     REACTION: Swelling  . Latex     REACTION: Blisters  . Morphine Sulfate     REACTION: Halucinations/deep sleep  . Clozapine Rash  . Fentanyl Nausea Only    "Feels like you are a hundred miles away when talking to her"    Consent Signed: Yes.    Is patient diabetic? No.  CBG today?  Pregnant: No. LMP: No LMP recorded. Patient has had a hysterectomy. (age 21-55)  Anticoagulants: no Anti-inflammatory: no Antibiotics: no  Procedure: bilateral sacroiliac steroid injection  Position: Prone Start Time:  10:16 am     End Time: 10:24 am  Fluoro Time: 44  RN/CMA Emberli Ballester, CMA Virlee Stroschein, CMA    Time 10:00am 10:34    BP 139/83 146/82    Pulse 56 60    Respirations 14 14    O2 Sat 96 97    S/S 6 6    Pain Level 10/10 6/10     D/C home with son [name], D/C instructions reviewed, and sits independently.

## 2017-02-07 ENCOUNTER — Other Ambulatory Visit: Payer: Self-pay | Admitting: Cardiology

## 2017-02-19 ENCOUNTER — Other Ambulatory Visit: Payer: Self-pay | Admitting: Physical Medicine & Rehabilitation

## 2017-02-21 NOTE — Telephone Encounter (Signed)
Recieved electronic medication refill request for tramadol, no mention in previous notes to continue this medication, please advise 

## 2017-03-01 ENCOUNTER — Other Ambulatory Visit: Payer: Self-pay

## 2017-03-15 ENCOUNTER — Telehealth: Payer: Self-pay

## 2017-03-15 NOTE — Telephone Encounter (Signed)
Patient has called stating she has had an increase in pain. Would like a call back from clinic staff regarding this matter. Also, would like to come in sooner for procedure. Dr. Letta Pate does not have any 30 min  openings for procedure until July 10th. Spoke with patient and patient states she needs something sooner than July 10th. Patient schedule appointment is July 20th, 2018.

## 2017-03-16 ENCOUNTER — Encounter: Payer: Self-pay | Admitting: Physical Medicine & Rehabilitation

## 2017-03-16 ENCOUNTER — Encounter: Payer: Medicare Other | Attending: Physical Medicine & Rehabilitation

## 2017-03-16 ENCOUNTER — Ambulatory Visit (HOSPITAL_BASED_OUTPATIENT_CLINIC_OR_DEPARTMENT_OTHER): Payer: Medicare Other | Admitting: Physical Medicine & Rehabilitation

## 2017-03-16 VITALS — BP 126/73 | HR 68

## 2017-03-16 DIAGNOSIS — Z888 Allergy status to other drugs, medicaments and biological substances status: Secondary | ICD-10-CM | POA: Insufficient documentation

## 2017-03-16 DIAGNOSIS — Z91040 Latex allergy status: Secondary | ICD-10-CM | POA: Diagnosis not present

## 2017-03-16 DIAGNOSIS — Z885 Allergy status to narcotic agent status: Secondary | ICD-10-CM | POA: Insufficient documentation

## 2017-03-16 DIAGNOSIS — M5412 Radiculopathy, cervical region: Secondary | ICD-10-CM | POA: Diagnosis not present

## 2017-03-16 DIAGNOSIS — M533 Sacrococcygeal disorders, not elsewhere classified: Secondary | ICD-10-CM | POA: Diagnosis present

## 2017-03-16 DIAGNOSIS — Z881 Allergy status to other antibiotic agents status: Secondary | ICD-10-CM | POA: Diagnosis not present

## 2017-03-16 MED ORDER — KETOROLAC TROMETHAMINE 30 MG/ML IJ SOLN
30.0000 mg | Freq: Once | INTRAMUSCULAR | Status: AC
  Administered 2017-03-16: 30 mg via INTRAMUSCULAR

## 2017-03-16 MED ORDER — ACETAMINOPHEN-CODEINE #3 300-30 MG PO TABS: 1.0000 | ORAL_TABLET | ORAL | 0 refills | Status: DC | PRN

## 2017-03-16 MED ORDER — MELOXICAM 7.5 MG PO TABS: 7.5000 mg | ORAL_TABLET | Freq: Every day | ORAL | 0 refills | Status: DC

## 2017-03-16 NOTE — Telephone Encounter (Signed)
I spoke with Emily Leonard and this is a new problem for which [name] has not been treating her for neck.  She has been put on schedule for 330 and I asked [name] to review chart since I see that she has been to [facility]

## 2017-03-16 NOTE — Progress Notes (Signed)
Subjective:    Patient ID: Emily Leonard, female    DOB: Feb 18, 1946, 71 y.o.   MRN: [id number]  HPI   Patient last seen by me for sacroiliac injection 02/02/2017. No complaints of neck pain at that time  71 year old female with 1.5 month history of neck pain, left arm pain, numbness, tingling and weakness. Has been evaluated by orthopedic surgery at [facility]. MRI of the cervical spine demonstrated evidence of severe left neural foraminal stenosis at C4-5. Moderate bilateral stenosis at C5-6 Holding up the left arm is helpful in relieving the pain. A BC arthritis powder was helpful. Tried gabapentin but this was not helpful. Has tried steroids burst and taper, patient states this was not helpful. Patient states she has not tried muscle relaxers Has not tried physical therapy.  Of today's relief from putting left hand on her head Pain Inventory Average Pain 0 Pain Right Now 10 My pain is dull and aching  In the last 24 hours, has pain interfered with the following? General activity 10 Relation with others 10 Enjoyment of life 10 What TIME of day is your pain at its worst? morning Sleep (in general) Fair  Pain is worse with: . Pain improves with: injections Relief from Meds: 0  Mobility walk without assistance ability to climb steps?  yes do you drive?  yes  Function retired  Neuro/Psych No problems in this area  Prior Studies Any changes since last visit?  no CLINICAL DATA: Cervical radiculopathy. Neck pain for 2 months. Left arm pain, numbness, tingling, and weakness. Popping and grinding in the neck.  EXAM: MRI CERVICAL SPINE WITHOUT CONTRAST  TECHNIQUE: Multiplanar, multisequence MR imaging of the cervical spine was performed. No intravenous contrast was administered.  COMPARISON: Maxillofacial CT 08/18/2013.  FINDINGS: Alignment: Cervical spine straightening. Slight retrolisthesis of C5 on C6.  Vertebrae: No evidence of fracture. Severe disc space  narrowing at C5-6 and C6-7 with degenerative endplate changes including sclerosis and mild edema. Small tubular T1 hypointense focus in the posterior C5 vertebral body just right of midline is of uncertain etiology but has a benign appearance, possibly vascular.  Cord: Normal signal and morphology.  Posterior Fossa, vertebral arteries, paraspinal tissues: Patchy T2 hyperintensities in the pons, nonspecific though most often seen in the setting of chronic small vessel ischemia. Preserve vertebral artery flow voids.  Disc levels:  C2-3: Minimal right facet arthrosis without stenosis.  C3-4: Moderate to severe right facet arthrosis and mild uncovertebral spurring without significant stenosis.  C4-5: Mild disc space narrowing. Disc bulging, left uncovertebral spurring, and mild left facet arthrosis result in severe left neural foraminal stenosis with potential C5 nerve root impingement. No spinal stenosis.  C5-6: Broad-based posterior disc osteophyte complex results in mild spinal stenosis and moderate bilateral neural foraminal stenosis potentially affecting the C6 nerve roots.  C6-7: Broad-based posterior disc osteophyte complex results in moderate bilateral neural foraminal stenosis without significant spinal stenosis.  C7-T1: Mild facet arthrosis without stenosis.  IMPRESSION: 1. 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 2. Mild spinal stenosis at C5-6. 3. 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   Electronically Signed By: Logan Bores M.D. On: 03/13/2017 15:09 Physicians involved in your care Any changes since last visit?  no   Family History  Problem Relation Age of Onset  . Hypertension Mother   . Hypertension Father   . Heart attack Father 60  . Heart attack Brother 16   Social History  Social History  . Marital status: Married    Spouse name: N/A  . Number of children: 4  . Years of  education: N/A   Social History Main Topics  . Smoking status: Current Every Day Smoker    Years: 30.00    Types: Cigarettes  . Smokeless tobacco: Never Used     Comment: 8 cigs daily  . Alcohol use 0.6 oz/week    1 Standard drinks or equivalent per week  . Drug use: No  . Sexual activity: Not on file   Other Topics Concern  . Not on file   Social History Narrative  . No narrative on file   Past Surgical History:  Procedure Laterality Date  . ABDOMINAL HYSTERECTOMY    . APPENDECTOMY    . CATARACT EXTRACTION, BILATERAL    . CERVICAL SPINE SURGERY    . COLONOSCOPY  02/2013  . HEMORRHOID SURGERY  03/2013  . LUMBAR SPINE SURGERY     x 3   Past Medical History:  Diagnosis Date  . Anxiety   . Chronic pain syndrome   . Degenerative disc disease   . Degenerative joint disease   . Essential hypertension, benign   . GERD (gastroesophageal reflux disease)   . Hyperlipidemia   . Insomnia   . Osteoarthritis   . Palpitations   . Restless leg syndrome   . Thyroid disease   . TIA (transient ischemic attack)    There were no vitals taken for this visit.  Opioid Risk Score:   Fall Risk Score:  `1  Depression screen PHQ 2/9  Depression screen [facility] 2/9 11/16/2016 12/31/2014  Decreased Interest 0 3  Down, Depressed, Hopeless 0 0  PHQ - 2 Score 0 3  Altered sleeping - 0  Tired, decreased energy - 1  Change in appetite - 0  Feeling bad or failure about yourself  - 0  Trouble concentrating - 0  Moving slowly or fidgety/restless - 0  Suicidal thoughts - 0  PHQ-9 Score - 4    Review of Systems  Constitutional: Negative.   HENT: Negative.   Eyes: Negative.   Respiratory: Negative.   Cardiovascular: Negative.   Gastrointestinal: Negative.   Endocrine: Negative.   Genitourinary: Negative.   Musculoskeletal: Negative.   Skin: Negative.   Allergic/Immunologic: Negative.   Neurological: Negative.   Hematological: Negative.   Psychiatric/Behavioral: Negative.   All other  systems reviewed and are negative.      Objective:   Physical Exam  Constitutional: She is oriented to person, place, and time. She appears well-developed and well-nourished.  HENT:  Head: Normocephalic and atraumatic.  Eyes: Conjunctivae and EOM are normal. Pupils are equal, round, and reactive to light.  Neck: Normal range of motion.  Neurological: She is alert and oriented to person, place, and time. Gait normal.  Reflex Scores:      Tricep reflexes are 2+ on the left side.      Bicep reflexes are 1+ on the right side and 0 on the left side.      Brachioradialis reflexes are 1+ on the right side and 0 on the left side.      Patellar reflexes are 2+ on the right side and 2+ on the left side. Motor strength is 5/5 bilateral deltoid, biceps, triceps, grip  Psychiatric: She has a normal mood and affect.  Nursing note and vitals reviewed. decreased sensation, left C6-C7 and C8 dermatomal distribution  Neck range of motion full flexion. Extension Limited to 50%,  lateral bending and rotation limited to 50%. Negative Spurling's.          Assessment & Plan:  1. Cervical radiculopathy, left side. Most likely C6, although radiologically C5 spinal nerve root has the most foraminal stenosis. Patient has symptoms that suggest multiple nerve root levels, given her numbness in the entire hand. No evidence of cervical myelopathy. Patient has been evaluated by orthopedic surgery.  Will administer Toradol injection today. Meloxicam 7.5 mg daily for 2 weeks. Tylenol with codeine 1 tablet every 4-6 hours times 5 days.  Acute on chronic pain, given patient. History of confusion with stronger schedule 2 narcotics would avoid hydrocodone, oxycodone, or fentanyl or morphine  Make referral for C7-T1 interlaminar epidural steroid injection, Dr. Laurence Spates at [facility] orthopedics

## 2017-03-16 NOTE — Patient Instructions (Signed)
Cervical Radiculopathy  Cervical radiculopathy means that a nerve in the neck is pinched or bruised. This can cause pain or loss of feeling (numbness) that runs from your neck to your arm and fingers.  Follow these instructions at home:  Managing pain  ? Take over-the-counter and prescription medicines only as told by your doctor.  ? If directed, put ice on the injured or painful area.  ? Put ice in a plastic bag.  ? Place a towel between your skin and the bag.  ? Leave the ice on for 20 minutes, 2?3 times per day.  ? If ice does not help, you can try using heat. Take a warm shower or warm bath, or use a heat pack as told by your doctor.  ? You may try a gentle neck and shoulder massage.  Activity  ? Rest as needed. Follow instructions from your doctor about any activities to avoid.  ? Do exercises as told by your doctor or physical therapist.  General instructions  ? If you were given a soft collar, wear it as told by your doctor.  ? Use a flat pillow when you sleep.  ? Keep all follow-up visits as told by your doctor. This is important.  Contact a doctor if:  ? Your condition does not improve with treatment.  Get help right away if:  ? Your pain gets worse and is not controlled with medicine.  ? You lose feeling or feel weak in your hand, arm, face, or leg.  ? You have a fever.  ? You have a stiff neck.  ? You cannot control when you poop or pee (have incontinence).  ? You have trouble with walking, balance, or talking.  This information is not intended to replace advice given to you by your health care provider. Make sure you discuss any questions you have with your health care provider.  Document Released: 09/21/2011 Document Revised: 03/09/2016 Document Reviewed: 11/26/2014  Elsevier Interactive Patient Education ? 2018 [employer]

## 2017-03-19 ENCOUNTER — Telehealth: Payer: Self-pay | Admitting: *Deleted

## 2017-03-19 DIAGNOSIS — M5412 Radiculopathy, cervical region: Secondary | ICD-10-CM

## 2017-03-19 NOTE — Telephone Encounter (Signed)
Emily Leonard called and asked if [name] had called Emily Leonard yet about her. She has not heard anything yet.  I see that the referral has been placed. Emily Leonard notified they will call once reviewed.

## 2017-03-20 NOTE — Telephone Encounter (Signed)
Updated referral per [facility] request.

## 2017-03-20 NOTE — Addendum Note (Signed)
Addended by: Charlett Blake on: 03/20/2017 09:43 AM   Modules accepted: Orders

## 2017-03-20 NOTE — Addendum Note (Signed)
Addended by: Caro Hight on: 03/20/2017 10:52 AM   Modules accepted: Orders

## 2017-04-03 ENCOUNTER — Ambulatory Visit (INDEPENDENT_AMBULATORY_CARE_PROVIDER_SITE_OTHER): Payer: Medicare Other | Admitting: Physical Medicine and Rehabilitation

## 2017-04-03 ENCOUNTER — Encounter (INDEPENDENT_AMBULATORY_CARE_PROVIDER_SITE_OTHER): Payer: Self-pay | Admitting: Physical Medicine and Rehabilitation

## 2017-04-03 VITALS — BP 120/74 | HR 58

## 2017-04-03 DIAGNOSIS — M4802 Spinal stenosis, cervical region: Secondary | ICD-10-CM | POA: Diagnosis not present

## 2017-04-03 DIAGNOSIS — M5412 Radiculopathy, cervical region: Secondary | ICD-10-CM

## 2017-04-03 DIAGNOSIS — M961 Postlaminectomy syndrome, not elsewhere classified: Secondary | ICD-10-CM | POA: Diagnosis not present

## 2017-04-03 DIAGNOSIS — M5010 Cervical disc disorder with radiculopathy, unspecified cervical region: Secondary | ICD-10-CM | POA: Diagnosis not present

## 2017-04-03 MED ORDER — DIAZEPAM 5 MG PO TABS: ORAL_TABLET | ORAL | 0 refills | Status: DC

## 2017-04-03 NOTE — Progress Notes (Deleted)
Right sided neck pain, left arm and upper back pain. Had x-rays and MRI at [facility]. Referred by Dr. Letta Pate. Numbness and tingling on left. Pain is most severe in left forearm. Symptoms have been present for 3 months.

## 2017-04-04 ENCOUNTER — Encounter (INDEPENDENT_AMBULATORY_CARE_PROVIDER_SITE_OTHER): Payer: Self-pay | Admitting: Physical Medicine and Rehabilitation

## 2017-04-04 NOTE — Progress Notes (Signed)
Emily Leonard - 71 y.o. female MRN [id number]  Date of birth: [birth year]  Office Visit Note: Visit Date: 04/03/2017 PCP: Jene Every, MD Referred by: Jene Every, MD  Subjective: Chief Complaint  Patient presents with  . Neck - Pain   HPI: Emily Leonard is a 71 year old right-hand dominant female accompanied by her son provided some of the history. She has a significant chronic pain history and history of multiple lumbar surgeries by Dr. Arnoldo Morale and has seen Dr. Ellene Route. She's also had prior cervical surgery by Dr. Arnoldo Morale in the 1990s. This appears to have been a posterior surgery. She comes in today at the request of Dr. Letta Pate who has been managing her care. He is requesting potential cervical epidural injection for what is left radicular-type arm pain. She reports essentially 3 months of severe neck pain which is actually more right sided neck pain but pain in the shoulder blade area and left arm that radiates underneath the axilla on the left and it can radiate down the dorsal aspect of the forearm into the medial 3 digits. These are digits 3,5 and 5.  She denies any specific trauma. She denies any issues around the time that it started. Her pain has been very severe and really no medications at this point of helped. She had x-rays and MRI performed at [facility]. We did have the report in Epic and I was able to get on [facility] and find the images from [facility]. Dr. Joycelyn Schmid has managed her care very well and she has tried gabapentin, Lyrica, Tylenol 3, tramadol, duloxetine and still not much in the way of relief for this left arm pain. Most recently Dr. Letta Pate gave her a Toradol injection because of the severity of the symptoms. He also started her on meloxicam. The MRI was reviewed with the patient. She feels like the arm is weak but does not experience focal weakness. She has not had physical therapy recently. She denies any right-sided symptoms or associated headaches. She does get  paresthesias but it's mostly a lot of pain. Again raising the arm above her shoulder height seem to alleviate the symptoms.    Review of Systems  Constitutional: Negative for chills, fever, malaise/fatigue and weight loss.  HENT: Negative for hearing loss and sinus pain.   Eyes: Negative for blurred vision, double vision and photophobia.  Respiratory: Negative for cough and shortness of breath.   Cardiovascular: Negative for chest pain, palpitations and leg swelling.  Gastrointestinal: Negative for abdominal pain, nausea and vomiting.  Genitourinary: Negative for flank pain.  Musculoskeletal: Positive for back pain and neck pain. Negative for myalgias.  Skin: Negative for itching and rash.  Neurological: Positive for tingling. Negative for tremors, focal weakness and weakness.  Endo/Heme/Allergies: Negative.   Psychiatric/Behavioral: Negative for depression.  All other systems reviewed and are negative.  Otherwise per HPI.  Assessment & Plan: Visit Diagnoses:  1. Cervical radiculopathy   2. Cervical disc disorder with radiculopathy   3. Spinal stenosis of cervical region   4. Post laminectomy syndrome     Plan: Findings:  Chronic pain history with significant lumbar fusions and multiple surgeries and history of cervical posterior surgery by Dr. Arnoldo Morale in the late 1990s. I'm unable to find exactly what surgery was performed but this clearly a posterior well-healed scar on the cervical spine. I was able to review images of the current MRI from May of this year and there is ligamentum flavum still present at the lower levels particular C7-T1.  In terms of her pain and clearly seems radicular in the left arm. Her MRI is interesting that there is arthritic change with uncovertebral joint hypertrophy and some reversal of the normal lordosis file C5-6 with a retrolisthesis. Her symptoms are somewhat multi-root symptoms. She has some C8 T1 symptoms most predominantly but then she has shoulder  blade pain and shoulder pain consistent maybe with more of a C5 nerve root. She has tried and failed multiple medications and is really in a predictable in terms of much pain she is having on that left side. We will go ahead and proceed with a cervical epidural injection C7-T1. If the dermatomal pattern was more consistent would consider cervical transforaminal injection. If she gets some relief with the injection I would have Dr. Joycelyn Schmid consider repeating the injection one time. If she doesn't get much in the way of relief I think the next step he might want to consider might be electrodiagnostic study. She does have some level of myofascial pain as well. We did give her a prescription for Valium preprocedure. We'll try to get this done as quick as we can. She'll return to see Dr. Joycelyn Schmid for her care. I spent more than 30 minutes speaking face-to-face with the patient with 50% of the time in counseling.    Meds & Orders:  Meds ordered this encounter  Medications  . diazepam (VALIUM) 5 MG tablet    Sig: Take 1 by mouth 1 to 2 hours pre-procedure. May repeat if necessary.    Dispense:  2 tablet    Refill:  0   No orders of the defined types were placed in this encounter.   Follow-up: Return for Left C7-T1 intralaminar epidural steroid injection..   Procedures: No procedures performed  No notes on file   Clinical History: MRI CERVICAL SPINE WITHOUT CONTRAST 03/13/2017  COMPARISON:Maxillofacial CT 08/18/2013.  FINDINGS: Alignment: Cervical spine straightening. Slight retrolisthesis of C5 on C6.  Vertebrae: No evidence of fracture. Severe disc space narrowing at C5-6 and C6-7 with degenerative endplate changes including sclerosis and mild edema. Small tubular T1 hypointense focus in the posterior C5 vertebral body just right of midline is of uncertain etiology but has a benign appearance, possibly vascular.  Cord: Normal signal and morphology.  Posterior Fossa, vertebral  arteries, paraspinal tissues: Patchy T2 hyperintensities in the pons, nonspecific though most often seen in the setting of chronic small vessel ischemia. Preserve vertebral artery flow voids.  Disc levels:  C2-3:Minimal right facet arthrosis without stenosis.  C3-4: Moderate to severe right facet arthrosis and mild uncovertebral spurring without significant stenosis.  C4-5: Mild disc space narrowing. Disc bulging, left uncovertebral spurring, and mild left facet arthrosis result in severe left neural foraminal stenosis with potential C5 nerve root impingement. No spinal stenosis.  C5-6: Broad-based posterior disc osteophyte complex results in mild spinal stenosis and moderate bilateral neural foraminal stenosis potentially affecting the C6 nerve roots.  C6-7: Broad-based posterior disc osteophyte complex results in moderate bilateral neural foraminal stenosis without significant spinal stenosis.  C7-T1:Mild facet arthrosis without stenosis.  She reports that she has been smoking Cigarettes.  She has smoked for the past 30.00 years. She has never used smokeless tobacco. No results for input(s): HGBA1C, LABURIC in the last 8760 hours.  Objective:  VS:  HT:    WT:   BMI:     BP:120/74  HR:(!) 58bpm  TEMP: ( )  RESP:  Physical Exam  Constitutional: She is oriented to person, place, and time. She  appears well-developed and well-nourished. No distress.  HENT:  Head: Normocephalic and atraumatic.  Nose: Nose normal.  Mouth/Throat: Oropharynx is clear and moist.  Eyes: Conjunctivae are normal. Pupils are equal, round, and reactive to light.  Neck: No tracheal deviation present. No thyromegaly present.  Cardiovascular: Regular rhythm and intact distal pulses.   Pulmonary/Chest: Effort normal. No respiratory distress.  Abdominal: She exhibits no distension. There is no guarding.  Musculoskeletal:  Examination of the cervical spine shows increased forward flexion. She has  well-healed surgical scar posteriorly at about the C5-C6 region. She does have active trigger points really bilaterally in the trapezius levator scapula and rhomboids. Right shoulder show some impingement signs with external rotation. This does not reproduce most of her pain. She has good strength bilaterally with elbow flexion and extension and wrist extension long finger flexion and finger abduction. She has no signs of atrophy. She has a negative Hoffmann's test bilaterally. She has 2+ muscle stretch reflexes at the biceps and brachioradialis.  Lymphadenopathy:    She has no cervical adenopathy.  Neurological: She is alert and oriented to person, place, and time. She exhibits normal muscle tone. Coordination normal.  Skin: Skin is warm. No rash noted. No erythema.  Psychiatric: She has a normal mood and affect. Her behavior is normal.  Nursing note and vitals reviewed.   Ortho Exam Imaging: No results found.  Past Medical/Family/Surgical/Social History: Medications & Allergies reviewed per EMR Patient Active Problem List   Diagnosis Date Noted  . Lumbosacral radiculopathy at S1 04/13/2015  . Left lumbar radiculitis 08/13/2014  . Precordial pain 05/22/2014  . Trochanteric bursitis of both hips 08/07/2013  . Current smoker 02/14/2013  . Anxiety 02/14/2013  . Chronic pain associated with significant psychosocial dysfunction 02/14/2013  . DDD (degenerative disc disease), lumbar 02/14/2013  . Acid reflux 02/14/2013  . HLD (hyperlipidemia) 02/14/2013  . Adult hypothyroidism 02/14/2013  . Idiopathic peripheral neuropathy 02/14/2013  . LBP (low back pain) 08/02/2012  . Arthralgia of hip 08/02/2012  . Postlaminectomy syndrome, lumbar region 01/08/2012  . Sacroiliac dysfunction 12/25/2011  . HTN (hypertension) 07/10/2011  . Tobacco abuse 07/10/2011  . Palpitations 07/10/2011  . Chest pain 06/06/2011  . Dyspnea 06/06/2011  . GLOSSODYNIA 10/27/2010  . Chronic low back pain 10/27/2010  .  ABDOMINAL PAIN -GENERALIZED 10/27/2010  . PERSONAL HX COLONIC POLYPS 10/27/2010   Past Medical History:  Diagnosis Date  . Anxiety   . Chronic pain syndrome   . Degenerative disc disease   . Degenerative joint disease   . Essential hypertension, benign   . GERD (gastroesophageal reflux disease)   . Hyperlipidemia   . Insomnia   . Osteoarthritis   . Palpitations   . Restless leg syndrome   . Thyroid disease   . TIA (transient ischemic attack)    Family History  Problem Relation Age of Onset  . Hypertension Mother   . Hypertension Father   . Heart attack Father 77  . Heart attack Brother 104   Past Surgical History:  Procedure Laterality Date  . ABDOMINAL HYSTERECTOMY    . APPENDECTOMY    . CATARACT EXTRACTION, BILATERAL    . CERVICAL SPINE SURGERY    . COLONOSCOPY  02/2013  . HEMORRHOID SURGERY  03/2013  . LUMBAR SPINE SURGERY     x 3   Social History   Occupational History  . Not on file.   Social History Main Topics  . Smoking status: Current Every Day Smoker    Years: 30.00  Types: Cigarettes  . Smokeless tobacco: Never Used     Comment: 8 cigs daily  . Alcohol use 0.6 oz/week    1 Standard drinks or equivalent per week  . Drug use: No  . Sexual activity: Not on file

## 2017-04-05 ENCOUNTER — Ambulatory Visit (INDEPENDENT_AMBULATORY_CARE_PROVIDER_SITE_OTHER): Payer: Medicare Other | Admitting: Physical Medicine and Rehabilitation

## 2017-04-05 ENCOUNTER — Ambulatory Visit (INDEPENDENT_AMBULATORY_CARE_PROVIDER_SITE_OTHER): Payer: Medicare Other

## 2017-04-05 ENCOUNTER — Encounter (INDEPENDENT_AMBULATORY_CARE_PROVIDER_SITE_OTHER): Payer: Self-pay | Admitting: Physical Medicine and Rehabilitation

## 2017-04-05 VITALS — BP 123/72 | HR 66 | Temp 98.0°F

## 2017-04-05 DIAGNOSIS — M5412 Radiculopathy, cervical region: Secondary | ICD-10-CM | POA: Diagnosis not present

## 2017-04-05 DIAGNOSIS — M5010 Cervical disc disorder with radiculopathy, unspecified cervical region: Secondary | ICD-10-CM | POA: Diagnosis not present

## 2017-04-05 DIAGNOSIS — M4802 Spinal stenosis, cervical region: Secondary | ICD-10-CM

## 2017-04-05 DIAGNOSIS — M961 Postlaminectomy syndrome, not elsewhere classified: Secondary | ICD-10-CM | POA: Diagnosis not present

## 2017-04-05 MED ORDER — BETAMETHASONE SOD PHOS & ACET 6 (3-3) MG/ML IJ SUSP
12.0000 mg | Freq: Once | INTRAMUSCULAR | Status: AC
  Administered 2017-04-05: 12 mg

## 2017-04-05 MED ORDER — LIDOCAINE HCL (PF) 1 % IJ SOLN
2.0000 mL | Freq: Once | INTRAMUSCULAR | Status: AC
  Administered 2017-04-05: 2 mL

## 2017-04-05 NOTE — Patient Instructions (Signed)

## 2017-04-05 NOTE — Procedures (Signed)
Cervical Epidural Steroid Injection - Interlaminar Approach with Fluoroscopic Guidance  Patient: Emily Leonard      Date of Birth: Dec 04, 1945 MRN: [id number] PCP: Jene Every, MD      Visit Date: 04/05/2017   [name] is a 71 year old female patient with chronic pain history and multiple lumbar surgeries and prior cervical surgery. She is followed by Dr. Letta Pate requested potential C7-T1 intralaminar injection for her radicular pain in the cervical spine. She has more right-sided neck pain and left arm pain. We will complete a left C7-T1 intralaminar epidural steroid injection. The patient did take an preprocedure but states he really hasn't done much for her. She'll follow-up with Dr. Letta Pate. She gets decent relief but not great I would look at repeating this one time. Please see our prior evaluation and management note for further details and justification.  Universal Protocol:    Date/Time: 06/21/182:22 PM  Consent Given By: the patient  Position: PRONE  Additional Comments: Vital signs were monitored before and after the procedure. Patient was prepped and draped in the usual sterile fashion. The correct patient, procedure, and site was verified.   Injection Procedure Details:  Procedure Site One Meds Administered:  Meds ordered this encounter  Medications  . lidocaine (PF) (XYLOCAINE) 1 % injection 2 mL  . betamethasone acetate-betamethasone sodium phosphate (CELESTONE) injection 12 mg     Laterality: Left  Location/Site:  C7-T1  Needle size: 20 G  Needle type: Touhy  Needle Placement: Paramedian epidural space  Findings:  -Contrast Used: 1 mL iohexol 180 mg iodine/mL   -Comments: Excellent flow of contrast into the epidural space.  Procedure Details: Using a paramedian approach from the side mentioned above, the region overlying the inferior lamina was localized under fluoroscopic visualization and the soft tissues overlying this structure were infiltrated  with 4 ml. of 1% Lidocaine without Epinephrine. A # 20 gauge, Tuohy needle was inserted into the epidural space using a paramedian approach.  The epidural space was localized using loss of resistance along with lateral and contralateral oblique bi-planar fluoroscopic views.  After negative aspirate for air, blood, and CSF, a 2 ml. volume of Isovue-250 was injected into the epidural space and the flow of contrast was observed. Radiographs were obtained for documentation purposes.   The injectate was administered into the level noted above.  Additional Comments:  The patient tolerated the procedure well Dressing: Band-Aid    Post-procedure details: Patient was observed during the procedure. Post-procedure instructions were reviewed.  Patient left the clinic in stable condition.

## 2017-04-05 NOTE — Progress Notes (Deleted)
Patient is here today for planned C7-T1 interlaminar injection. No change in symptoms.  

## 2017-05-02 ENCOUNTER — Other Ambulatory Visit: Payer: Self-pay | Admitting: Neurosurgery

## 2017-05-03 NOTE — Pre-Procedure Instructions (Signed)
Emily Leonard  05/03/2017      [place], [place] - [address] [place] [address] Phone: [phone] Fax: [phone]    Your procedure is scheduled on July 23  Report to [facility] at [phone] A.M.  Call this number if you have problems the morning of surgery:  [phone]   Remember:  Do not eat food or drink liquids after midnight.   Take these medicines the morning of surgery with A SIP OF WATER budesonide-formoterol (SYMBICORT) if needed, diazepam (VALIUM, DULoxetine (CYMBALTA), gabapentin (NEURONTIN), pregabalin (LYRICA), traMADol (ULTRAM)    7 days prior to surgery STOP taking any Aspirin, Aleve, Naproxen, Ibuprofen, Motrin, Advil, Goody's, BC's, all herbal medications, fish oil, and all vitamins meloxicam (MOBIC)     Do not wear jewelry, make-up or nail polish.  Do not wear lotions, powders, or perfumes, or deoderant.  Do not shave 48 hours prior to surgery.    Do not bring valuables to the hospital.  [facility] is not responsible for any belongings or valuables.  Contacts, dentures or bridgework may not be worn into surgery.  Leave your suitcase in the car.  After surgery it may be brought to your room.  For patients admitted to the hospital, discharge time will be determined by your treatment team.  Patients discharged the day of surgery will not be allowed to drive home.    Special instructions:   [facility]- Preparing For Surgery  Before surgery, you can play an important role. Because skin is not sterile, your skin needs to be as free of germs as possible. You can reduce the number of germs on your skin by washing with CHG (chlorahexidine gluconate) Soap before surgery.  CHG is an antiseptic cleaner which kills germs and bonds with the skin to continue killing germs even after washing.  Please do not use if you have an allergy to CHG or antibacterial soaps. If your skin becomes  reddened/irritated stop using the CHG.  Do not shave (including legs and underarms) for at least 48 hours prior to first CHG shower. It is OK to shave your face.  Please follow these instructions carefully.   1. Shower the NIGHT BEFORE SURGERY and the MORNING OF SURGERY with CHG.   2. If you chose to wash your hair, wash your hair first as usual with your normal shampoo.  3. After you shampoo, rinse your hair and body thoroughly to remove the shampoo.  4. Use CHG as you would any other liquid soap. You can apply CHG directly to the skin and wash gently with a scrungie or a clean washcloth.   5. Apply the CHG Soap to your body ONLY FROM THE NECK DOWN.  Do not use on open wounds or open sores. Avoid contact with your eyes, ears, mouth and genitals (private parts). Wash genitals (private parts) with your normal soap.  6. Wash thoroughly, paying special attention to the area where your surgery will be performed.  7. Thoroughly rinse your body with warm water from the neck down.  8. DO NOT shower/wash with your normal soap after using and rinsing off the CHG Soap.  9. Pat yourself dry with a CLEAN TOWEL.   10. Wear CLEAN PAJAMAS   11. Place CLEAN SHEETS on your bed the night of your first shower and DO NOT SLEEP WITH PETS.    Day of Surgery: Do not apply  any deodorants/lotions. Please wear clean clothes to the hospital/surgery center.      Please read over the following fact sheets that you were given.

## 2017-05-04 ENCOUNTER — Ambulatory Visit (HOSPITAL_COMMUNITY)
Admission: RE | Admit: 2017-05-04 | Discharge: 2017-05-04 | Disposition: A | Discharge status: Home / Self Care | Payer: Medicare Other | Source: Ambulatory Visit | Attending: Anesthesiology | Admitting: Anesthesiology

## 2017-05-04 ENCOUNTER — Encounter (HOSPITAL_COMMUNITY)
Admission: RE | Admit: 2017-05-04 | Discharge: 2017-05-04 | Disposition: A | Discharge status: Home / Self Care | Payer: Medicare Other | Source: Ambulatory Visit | Attending: Neurosurgery | Admitting: Neurosurgery

## 2017-05-04 ENCOUNTER — Encounter (HOSPITAL_COMMUNITY): Payer: Self-pay

## 2017-05-04 ENCOUNTER — Encounter: Payer: Medicare Other | Attending: Physical Medicine & Rehabilitation

## 2017-05-04 ENCOUNTER — Ambulatory Visit (HOSPITAL_BASED_OUTPATIENT_CLINIC_OR_DEPARTMENT_OTHER): Payer: Medicare Other | Admitting: Physical Medicine & Rehabilitation

## 2017-05-04 VITALS — BP 133/83 | HR 61

## 2017-05-04 DIAGNOSIS — Z01818 Encounter for other preprocedural examination: Secondary | ICD-10-CM | POA: Insufficient documentation

## 2017-05-04 DIAGNOSIS — M7061 Trochanteric bursitis, right hip: Secondary | ICD-10-CM | POA: Diagnosis not present

## 2017-05-04 DIAGNOSIS — M7062 Trochanteric bursitis, left hip: Secondary | ICD-10-CM

## 2017-05-04 DIAGNOSIS — Z885 Allergy status to narcotic agent status: Secondary | ICD-10-CM | POA: Insufficient documentation

## 2017-05-04 DIAGNOSIS — Z881 Allergy status to other antibiotic agents status: Secondary | ICD-10-CM | POA: Insufficient documentation

## 2017-05-04 DIAGNOSIS — R05 Cough: Secondary | ICD-10-CM

## 2017-05-04 DIAGNOSIS — Z01812 Encounter for preprocedural laboratory examination: Secondary | ICD-10-CM | POA: Insufficient documentation

## 2017-05-04 DIAGNOSIS — Z91040 Latex allergy status: Secondary | ICD-10-CM | POA: Insufficient documentation

## 2017-05-04 DIAGNOSIS — M533 Sacrococcygeal disorders, not elsewhere classified: Secondary | ICD-10-CM | POA: Insufficient documentation

## 2017-05-04 DIAGNOSIS — Z888 Allergy status to other drugs, medicaments and biological substances status: Secondary | ICD-10-CM | POA: Insufficient documentation

## 2017-05-04 DIAGNOSIS — R059 Cough, unspecified: Secondary | ICD-10-CM

## 2017-05-04 LAB — BASIC METABOLIC PANEL
ANION GAP: 11 (ref 5–15)
BUN: 14 mg/dL (ref 6–20)
CHLORIDE: 101 mmol/L (ref 101–111)
CO2: 25 mmol/L (ref 22–32)
Calcium: 9.8 mg/dL (ref 8.9–10.3)
Creatinine, Ser: 0.92 mg/dL (ref 0.44–1.00)
GFR calc non Af Amer: >60 mL/min (ref >60)
Glucose, Bld: 94 mg/dL (ref 65–99)
POTASSIUM: 3.6 mmol/L (ref 3.5–5.1)
SODIUM: 137 mmol/L (ref 135–145)

## 2017-05-04 LAB — TYPE AND SCREEN
ABO/RH(D): O POS
ANTIBODY SCREEN: NEGATIVE

## 2017-05-04 LAB — CBC
HEMATOCRIT: 45.5 % (ref 36.0–46.0)
HEMOGLOBIN: 15.2 g/dL — AB (ref 12.0–15.0)
MCH: 30.2 pg (ref 26.0–34.0)
MCHC: 33.4 g/dL (ref 30.0–36.0)
MCV: 90.5 fL (ref 78.0–100.0)
Platelets: 256 10*3/uL (ref 150–400)
RBC: 5.03 MIL/uL (ref 3.87–5.11)
RDW: 15 % (ref 11.5–15.5)
WBC: 7.6 10*3/uL (ref 4.0–10.5)

## 2017-05-04 LAB — SURGICAL PCR SCREEN
MRSA, PCR: NEGATIVE
STAPHYLOCOCCUS AUREUS: NEGATIVE

## 2017-05-04 MED ORDER — TRAMADOL HCL 50 MG PO TABS: 50.0000 mg | ORAL_TABLET | Freq: Two times a day (BID) | ORAL | 2 refills | Status: DC

## 2017-05-04 NOTE — Progress Notes (Addendum)
Bilateral Trochanteric bursa injection With ultrasound guidance  Indication Trochanteric bursitis. Exam has tenderness over the greater trochanter of the hip. Pain has not responded to conservative care such as exercise therapy and oral medications. Pain interferes with sleep or with mobility. She did not respond to palpation guided greater trochanteric bursa injections performed 3 months ago. Because of this ultrasound guidance is utilized Informed consent was obtained after describing risks and benefits of the procedure with the patient these include bleeding bruising and infection. Patient has signed written consent form. Patient placed in a lateral decubitus position with the affected hip superior. The greater trochanter was visualized with target on the posterior facet. 25-gauge 1.5 inch needle was used to anesthetize the skin and subcutaneous tissue with 1 cc 1% lidocaine. Then a 80 mm Echo block needle was inserted under direct ultrasound visualization targeting the posterior facet of the greater trochanter. Needle slightly withdrawn then Zilretta 32mg  reconstituted in 71ml NS Left side was performed first, than the right side was performed Patient tolerated procedure well. Post procedure instructions given.

## 2017-05-04 NOTE — Progress Notes (Signed)
Anesthesia Chart Review:  Pt is a 71 year old female scheduled for C4-5, C5-6, C6-7 ACDF on 05/07/2017 with Newman Pies, MD  - PCP is Jene Every, MD (notes in care everywhere)  - Cardiologist is Minus Breeding, MD, last office visit 06/23/16 for palpitations, HTN.  - Pulmonologist is Marshell Garfinkel, MD, last office visit 02/04/16  PMH includes:  HTN, hyperlipidemia, palpitations, COPD, TIA, GERD.  Current smoker. BMI 24  Medications include: ASA 81 mg, Symbicort, diltiazem, HCTZ, levothyroxine, pravastatin  Pulse 63   Temp 37.2 C (Oral)   Resp 20   Ht 5\' 4"  (1.626 m)   Wt 138 lb 14.4 oz (63 kg)   SpO2 97%   BMI 23.84 kg/m    Preoperative labs reviewed.    CXR 05/04/17: No active cardiopulmonary disease.  CT chest 01/20/16:  1. No evidence of primary malignancy. 2. Centrilobular emphysema. Atherosclerosis, including within the coronary arteries. No other explanation for shortness of Breath. 3. Minimal right middle lobe nodularity, felt to be partially calcified. Likely post infectious or inflammatory.  EKG 06/23/16: Sinus bradycardia (56 bpm). Septal infarct, age-indeterminate.  Nuclear stress test 06/04/14: Low risk stress nuclear study with small area of distal anterior breast attenuation artifact. No reversible ischemia. LV Wall Motion:  NL LV Function; NL Wall Motion; EF 74%  Called to see pt in PAT for c/o worsening cough.  Pt reports she coughs every morning when she wakes up, and for the past month, the morning cough is worse than usual.  No coughing during the day otherwise. Cough is productive, but sputum is no different than usual. Denies fever, chills, feeling SOB.  Does not feel this is flare of COPD.  Pt ambulated into hospital just prior to seeing me; she was not SOB, no tachypnea after walking a fair distance. No cough observed. Lungs CTA B.    If no changes, I anticipate pt can proceed with surgery as scheduled.   Willeen Cass, FNP-BC [facility] Short Stay Surgical  Center/Anesthesiology Phone: [phone] 05/04/2017 3:12 PM

## 2017-05-04 NOTE — Progress Notes (Signed)
PCP - Jene Every Cardiologist - [place]  Chest x-ray - 05/04/17 EKG - 06/23/16 Stress Test - 06/04/14 ECHO - 06/13/11 Cardiac Cath - denies  New onset of couch and shortness of breath within the last month.  No new medications prescribed to the patient. Will get chest xray today.  Spoke with Levada Dy who is going to see patient    Patient denies fever and chest pain at PAT appointment   Patient verbalized understanding of instructions that were given to them at the PAT appointment. Patient was also instructed that they will need to review over the PAT instructions again at home before surgery.

## 2017-05-04 NOTE — Patient Instructions (Signed)
Please take the medication. Dr. Arnoldo Morale prescribes after surgery and stopped the tramadol until you finish the post operative pain medications

## 2017-05-07 ENCOUNTER — Inpatient Hospital Stay (HOSPITAL_COMMUNITY)
Admission: RE | Admit: 2017-05-07 | Discharge: 2017-05-08 | DRG: 473 | Disposition: A | Discharge status: Home / Self Care | Payer: Medicare Other | Source: Ambulatory Visit | Attending: Neurosurgery | Admitting: Neurosurgery

## 2017-05-07 ENCOUNTER — Encounter (HOSPITAL_COMMUNITY)
Admission: RE | Disposition: A | Discharge status: Home / Self Care | Payer: Self-pay | Source: Ambulatory Visit | Attending: Neurosurgery

## 2017-05-07 ENCOUNTER — Inpatient Hospital Stay (HOSPITAL_COMMUNITY): Payer: Medicare Other | Admitting: Certified Registered Nurse Anesthetist

## 2017-05-07 ENCOUNTER — Inpatient Hospital Stay (HOSPITAL_COMMUNITY): Payer: Medicare Other | Admitting: Emergency Medicine

## 2017-05-07 ENCOUNTER — Encounter (HOSPITAL_COMMUNITY): Payer: Self-pay | Admitting: Certified Registered Nurse Anesthetist

## 2017-05-07 ENCOUNTER — Inpatient Hospital Stay (HOSPITAL_COMMUNITY): Payer: Medicare Other

## 2017-05-07 DIAGNOSIS — Z7983 Long term (current) use of bisphosphonates: Secondary | ICD-10-CM

## 2017-05-07 DIAGNOSIS — F17210 Nicotine dependence, cigarettes, uncomplicated: Secondary | ICD-10-CM | POA: Diagnosis present

## 2017-05-07 DIAGNOSIS — Z91040 Latex allergy status: Secondary | ICD-10-CM

## 2017-05-07 DIAGNOSIS — M5010 Cervical disc disorder with radiculopathy, unspecified cervical region: Principal | ICD-10-CM | POA: Diagnosis present

## 2017-05-07 DIAGNOSIS — Z7982 Long term (current) use of aspirin: Secondary | ICD-10-CM | POA: Diagnosis not present

## 2017-05-07 DIAGNOSIS — Z79899 Other long term (current) drug therapy: Secondary | ICD-10-CM | POA: Diagnosis not present

## 2017-05-07 DIAGNOSIS — Z885 Allergy status to narcotic agent status: Secondary | ICD-10-CM

## 2017-05-07 DIAGNOSIS — Z7951 Long term (current) use of inhaled steroids: Secondary | ICD-10-CM | POA: Diagnosis not present

## 2017-05-07 DIAGNOSIS — G4700 Insomnia, unspecified: Secondary | ICD-10-CM | POA: Diagnosis present

## 2017-05-07 DIAGNOSIS — M4722 Other spondylosis with radiculopathy, cervical region: Secondary | ICD-10-CM | POA: Diagnosis present

## 2017-05-07 DIAGNOSIS — M79603 Pain in arm, unspecified: Secondary | ICD-10-CM | POA: Diagnosis present

## 2017-05-07 DIAGNOSIS — G894 Chronic pain syndrome: Secondary | ICD-10-CM | POA: Diagnosis present

## 2017-05-07 DIAGNOSIS — Z419 Encounter for procedure for purposes other than remedying health state, unspecified: Secondary | ICD-10-CM

## 2017-05-07 DIAGNOSIS — Z8673 Personal history of transient ischemic attack (TIA), and cerebral infarction without residual deficits: Secondary | ICD-10-CM

## 2017-05-07 DIAGNOSIS — Z888 Allergy status to other drugs, medicaments and biological substances status: Secondary | ICD-10-CM | POA: Diagnosis not present

## 2017-05-07 DIAGNOSIS — M4802 Spinal stenosis, cervical region: Secondary | ICD-10-CM | POA: Diagnosis present

## 2017-05-07 HISTORY — PX: ANTERIOR CERVICAL DECOMP/DISCECTOMY FUSION: SHX1161

## 2017-05-07 SURGERY — ANTERIOR CERVICAL DECOMPRESSION/DISCECTOMY FUSION 3 LEVELS
Anesthesia: General | Site: Spine Cervical

## 2017-05-07 MED ORDER — SUGAMMADEX SODIUM 200 MG/2ML IV SOLN
INTRAVENOUS | Status: AC
  Filled 2017-05-07: qty 2

## 2017-05-07 MED ORDER — THROMBIN 20000 UNITS EX SOLR
CUTANEOUS | Status: AC
  Filled 2017-05-07: qty 20000

## 2017-05-07 MED ORDER — BISACODYL 10 MG RE SUPP: 10.0000 mg | Freq: Every day | RECTAL | Status: DC | PRN

## 2017-05-07 MED ORDER — FENTANYL CITRATE (PF) 250 MCG/5ML IJ SOLN
INTRAMUSCULAR | Status: AC
  Filled 2017-05-07: qty 5

## 2017-05-07 MED ORDER — LACTATED RINGERS IV SOLN
INTRAVENOUS | Status: DC
  Administered 2017-05-07: 19:00:00 via INTRAVENOUS

## 2017-05-07 MED ORDER — OXYCODONE HCL 5 MG PO TABS
5.0000 mg | ORAL_TABLET | ORAL | Status: DC | PRN
  Administered 2017-05-07: 5 mg via ORAL
  Administered 2017-05-08: 10 mg via ORAL
  Filled 2017-05-07: qty 2
  Filled 2017-05-07: qty 1

## 2017-05-07 MED ORDER — THROMBIN 5000 UNITS EX SOLR
CUTANEOUS | Status: AC
  Filled 2017-05-07: qty 5000

## 2017-05-07 MED ORDER — PREGABALIN 50 MG PO CAPS
50.0000 mg | ORAL_CAPSULE | Freq: Three times a day (TID) | ORAL | Status: DC
  Administered 2017-05-07: 50 mg via ORAL
  Filled 2017-05-07: qty 1

## 2017-05-07 MED ORDER — ONDANSETRON HCL 4 MG PO TABS: 4.0000 mg | ORAL_TABLET | Freq: Four times a day (QID) | ORAL | Status: DC | PRN

## 2017-05-07 MED ORDER — BACITRACIN ZINC 500 UNIT/GM EX OINT
TOPICAL_OINTMENT | CUTANEOUS | Status: DC | PRN
  Administered 2017-05-07: 1 via TOPICAL

## 2017-05-07 MED ORDER — BACITRACIN ZINC 500 UNIT/GM EX OINT
TOPICAL_OINTMENT | CUTANEOUS | Status: AC
  Filled 2017-05-07: qty 28.35

## 2017-05-07 MED ORDER — LACTATED RINGERS IV SOLN
INTRAVENOUS | Status: DC
  Administered 2017-05-07 (×2): via INTRAVENOUS

## 2017-05-07 MED ORDER — BUPIVACAINE-EPINEPHRINE (PF) 0.5% -1:200000 IJ SOLN
INTRAMUSCULAR | Status: AC
  Filled 2017-05-07: qty 30

## 2017-05-07 MED ORDER — ALENDRONATE SODIUM 70 MG PO TABS: 70.0000 mg | ORAL_TABLET | ORAL | Status: DC

## 2017-05-07 MED ORDER — CEFAZOLIN SODIUM-DEXTROSE 2-4 GM/100ML-% IV SOLN
2.0000 g | Freq: Three times a day (TID) | INTRAVENOUS | Status: AC
  Administered 2017-05-07 – 2017-05-08 (×2): 2 g via INTRAVENOUS
  Filled 2017-05-07 (×2): qty 100

## 2017-05-07 MED ORDER — THROMBIN 5000 UNITS EX SOLR
OROMUCOSAL | Status: DC | PRN
  Administered 2017-05-07: 5 mL via TOPICAL

## 2017-05-07 MED ORDER — TRAZODONE HCL 150 MG PO TABS
150.0000 mg | ORAL_TABLET | Freq: Every day | ORAL | Status: DC
  Administered 2017-05-07: 150 mg via ORAL
  Filled 2017-05-07: qty 1

## 2017-05-07 MED ORDER — SODIUM CHLORIDE 0.9 % IR SOLN
Status: DC | PRN
  Administered 2017-05-07: 500 mL

## 2017-05-07 MED ORDER — DULOXETINE HCL 30 MG PO CPEP: 30.0000 mg | ORAL_CAPSULE | Freq: Every day | ORAL | Status: DC

## 2017-05-07 MED ORDER — ONDANSETRON HCL 4 MG/2ML IJ SOLN: 4.0000 mg | Freq: Four times a day (QID) | INTRAMUSCULAR | Status: DC | PRN

## 2017-05-07 MED ORDER — SCOPOLAMINE 1 MG/3DAYS TD PT72
1.0000 | MEDICATED_PATCH | TRANSDERMAL | Status: DC
  Administered 2017-05-07: 1.5 mg via TRANSDERMAL
  Filled 2017-05-07: qty 1

## 2017-05-07 MED ORDER — SUGAMMADEX SODIUM 200 MG/2ML IV SOLN
INTRAVENOUS | Status: DC | PRN
  Administered 2017-05-07: 120 mg via INTRAVENOUS

## 2017-05-07 MED ORDER — ROCURONIUM BROMIDE 10 MG/ML (PF) SYRINGE
PREFILLED_SYRINGE | INTRAVENOUS | Status: AC
  Filled 2017-05-07: qty 5

## 2017-05-07 MED ORDER — LEVOTHYROXINE SODIUM 100 MCG PO TABS: 100.0000 ug | ORAL_TABLET | ORAL | Status: DC

## 2017-05-07 MED ORDER — 0.9 % SODIUM CHLORIDE (POUR BTL) OPTIME
TOPICAL | Status: DC | PRN
  Administered 2017-05-07: 1000 mL

## 2017-05-07 MED ORDER — MOMETASONE FURO-FORMOTEROL FUM 200-5 MCG/ACT IN AERO
2.0000 | INHALATION_SPRAY | Freq: Two times a day (BID) | RESPIRATORY_TRACT | Status: DC
  Administered 2017-05-07: 2 via RESPIRATORY_TRACT
  Filled 2017-05-07: qty 8.8

## 2017-05-07 MED ORDER — TRAMADOL HCL 50 MG PO TABS
50.0000 mg | ORAL_TABLET | Freq: Two times a day (BID) | ORAL | Status: DC
  Administered 2017-05-07: 50 mg via ORAL
  Filled 2017-05-07: qty 1

## 2017-05-07 MED ORDER — PHENYLEPHRINE HCL 10 MG/ML IJ SOLN
INTRAMUSCULAR | Status: DC | PRN
  Administered 2017-05-07 (×5): 80 ug via INTRAVENOUS

## 2017-05-07 MED ORDER — SURGIFOAM 100 EX MISC
CUTANEOUS | Status: DC | PRN
  Administered 2017-05-07: 20 mL via TOPICAL

## 2017-05-07 MED ORDER — ACETAMINOPHEN 325 MG PO TABS: 650.0000 mg | ORAL_TABLET | ORAL | Status: DC | PRN

## 2017-05-07 MED ORDER — CEFAZOLIN SODIUM-DEXTROSE 2-4 GM/100ML-% IV SOLN
2.0000 g | INTRAVENOUS | Status: AC
  Administered 2017-05-07: 2 g via INTRAVENOUS

## 2017-05-07 MED ORDER — PRAVASTATIN SODIUM 40 MG PO TABS
40.0000 mg | ORAL_TABLET | Freq: Every day | ORAL | Status: DC
  Administered 2017-05-07: 40 mg via ORAL
  Filled 2017-05-07: qty 1

## 2017-05-07 MED ORDER — FENTANYL CITRATE (PF) 100 MCG/2ML IJ SOLN
INTRAMUSCULAR | Status: DC | PRN
  Administered 2017-05-07 (×3): 50 ug via INTRAVENOUS
  Administered 2017-05-07: 100 ug via INTRAVENOUS

## 2017-05-07 MED ORDER — HYDROMORPHONE HCL 1 MG/ML IJ SOLN: 0.2500 mg | INTRAMUSCULAR | Status: DC | PRN

## 2017-05-07 MED ORDER — MENTHOL 3 MG MT LOZG: 1.0000 | LOZENGE | OROMUCOSAL | Status: DC | PRN

## 2017-05-07 MED ORDER — DOCUSATE SODIUM 100 MG PO CAPS
100.0000 mg | ORAL_CAPSULE | Freq: Two times a day (BID) | ORAL | Status: DC
  Administered 2017-05-07: 100 mg via ORAL
  Filled 2017-05-07: qty 1

## 2017-05-07 MED ORDER — BUPIVACAINE-EPINEPHRINE (PF) 0.5% -1:200000 IJ SOLN
INTRAMUSCULAR | Status: DC | PRN
  Administered 2017-05-07: 10 mL

## 2017-05-07 MED ORDER — GABAPENTIN 300 MG PO CAPS
300.0000 mg | ORAL_CAPSULE | Freq: Two times a day (BID) | ORAL | Status: DC
  Administered 2017-05-07: 300 mg via ORAL
  Filled 2017-05-07: qty 1

## 2017-05-07 MED ORDER — LIDOCAINE HCL (CARDIAC) 20 MG/ML IV SOLN
INTRAVENOUS | Status: DC | PRN
  Administered 2017-05-07: 60 mg via INTRAVENOUS

## 2017-05-07 MED ORDER — EPHEDRINE SULFATE 50 MG/ML IJ SOLN
INTRAMUSCULAR | Status: DC | PRN
  Administered 2017-05-07: 5 mg via INTRAVENOUS
  Administered 2017-05-07: 15 mg via INTRAVENOUS
  Administered 2017-05-07: 5 mg via INTRAVENOUS

## 2017-05-07 MED ORDER — ALUM & MAG HYDROXIDE-SIMETH 200-200-20 MG/5ML PO SUSP: 30.0000 mL | Freq: Four times a day (QID) | ORAL | Status: DC | PRN

## 2017-05-07 MED ORDER — HYDROMORPHONE HCL 1 MG/ML IJ SOLN: 1.0000 mg | INTRAMUSCULAR | Status: DC | PRN

## 2017-05-07 MED ORDER — PHENYLEPHRINE 40 MCG/ML (10ML) SYRINGE FOR IV PUSH (FOR BLOOD PRESSURE SUPPORT)
PREFILLED_SYRINGE | INTRAVENOUS | Status: AC
  Filled 2017-05-07: qty 10

## 2017-05-07 MED ORDER — ONDANSETRON HCL 4 MG/2ML IJ SOLN
INTRAMUSCULAR | Status: AC
  Filled 2017-05-07: qty 2

## 2017-05-07 MED ORDER — PROPOFOL 10 MG/ML IV BOLUS
INTRAVENOUS | Status: AC
  Filled 2017-05-07: qty 20

## 2017-05-07 MED ORDER — EPHEDRINE 5 MG/ML INJ
INTRAVENOUS | Status: AC
  Filled 2017-05-07: qty 10

## 2017-05-07 MED ORDER — DILTIAZEM HCL 30 MG PO TABS: 30.0000 mg | ORAL_TABLET | Freq: Every day | ORAL | Status: DC

## 2017-05-07 MED ORDER — THROMBIN 20000 UNITS EX SOLR: CUTANEOUS | Status: DC | PRN

## 2017-05-07 MED ORDER — ACETAMINOPHEN 650 MG RE SUPP: 650.0000 mg | RECTAL | Status: DC | PRN

## 2017-05-07 MED ORDER — CHLORHEXIDINE GLUCONATE CLOTH 2 % EX PADS: 6.0000 | MEDICATED_PAD | Freq: Once | CUTANEOUS | Status: DC

## 2017-05-07 MED ORDER — ROCURONIUM BROMIDE 100 MG/10ML IV SOLN
INTRAVENOUS | Status: DC | PRN
  Administered 2017-05-07: 70 mg via INTRAVENOUS
  Administered 2017-05-07: 30 mg via INTRAVENOUS

## 2017-05-07 MED ORDER — CEFAZOLIN SODIUM-DEXTROSE 2-4 GM/100ML-% IV SOLN
INTRAVENOUS | Status: AC
  Filled 2017-05-07: qty 100

## 2017-05-07 MED ORDER — PROPOFOL 10 MG/ML IV BOLUS
INTRAVENOUS | Status: DC | PRN
  Administered 2017-05-07: 180 mg via INTRAVENOUS

## 2017-05-07 MED ORDER — DEXAMETHASONE 4 MG PO TABS
4.0000 mg | ORAL_TABLET | Freq: Four times a day (QID) | ORAL | Status: AC
  Administered 2017-05-07 – 2017-05-08 (×3): 4 mg via ORAL
  Filled 2017-05-07 (×3): qty 1

## 2017-05-07 MED ORDER — PHENOL 1.4 % MT LIQD: 1.0000 | OROMUCOSAL | Status: DC | PRN

## 2017-05-07 MED ORDER — HYDROCHLOROTHIAZIDE 25 MG PO TABS
25.0000 mg | ORAL_TABLET | Freq: Every evening | ORAL | Status: DC
  Administered 2017-05-07: 25 mg via ORAL
  Filled 2017-05-07 (×2): qty 1

## 2017-05-07 MED ORDER — DEXAMETHASONE SODIUM PHOSPHATE 10 MG/ML IJ SOLN
INTRAMUSCULAR | Status: DC | PRN
  Administered 2017-05-07: 10 mg via INTRAVENOUS

## 2017-05-07 MED ORDER — LIDOCAINE HCL (CARDIAC) 20 MG/ML IV SOLN
INTRAVENOUS | Status: AC
  Filled 2017-05-07: qty 5

## 2017-05-07 MED ORDER — ZOLPIDEM TARTRATE 5 MG PO TABS: 5.0000 mg | ORAL_TABLET | Freq: Every evening | ORAL | Status: DC | PRN

## 2017-05-07 MED ORDER — DEXAMETHASONE SODIUM PHOSPHATE 4 MG/ML IJ SOLN: 4.0000 mg | Freq: Four times a day (QID) | INTRAMUSCULAR | Status: AC

## 2017-05-07 SURGICAL SUPPLY — 69 items
APL SKNCLS STERI-STRIP NONHPOA (GAUZE/BANDAGES/DRESSINGS) ×1
BAG DECANTER FOR FLEXI CONT (MISCELLANEOUS) ×3 IMPLANT
BENZOIN TINCTURE PRP APPL 2/3 (GAUZE/BANDAGES/DRESSINGS) ×4 IMPLANT
BIT DRILL NEURO 2X3.1 SFT TUCH (MISCELLANEOUS) ×1 IMPLANT
BLADE SURG 15 STRL LF DISP TIS (BLADE) ×1 IMPLANT
BLADE SURG 15 STRL SS (BLADE) ×3
BLADE ULTRA TIP 2M (BLADE) ×1 IMPLANT
BUR BARREL STRAIGHT FLUTE 4.0 (BURR) ×3 IMPLANT
BUR MATCHSTICK NEURO 3.0 LAGG (BURR) ×3 IMPLANT
CAGE PEEK VISTAS 11X14X6 (Cage) ×4 IMPLANT
CANISTER SUCT 3000ML PPV (MISCELLANEOUS) ×3 IMPLANT
CARTRIDGE OIL MAESTRO DRILL (MISCELLANEOUS) ×1 IMPLANT
CLOSURE WOUND 1/2 X4 (GAUZE/BANDAGES/DRESSINGS) ×1
COVER MAYO STAND STRL (DRAPES) ×3 IMPLANT
DIFFUSER DRILL AIR PNEUMATIC (MISCELLANEOUS) ×3 IMPLANT
DRAPE LAPAROTOMY 100X72 PEDS (DRAPES) ×3 IMPLANT
DRAPE MICROSCOPE LEICA (MISCELLANEOUS) IMPLANT
DRAPE POUCH INSTRU U-SHP 10X18 (DRAPES) ×3 IMPLANT
DRAPE SURG 17X23 STRL (DRAPES) ×6 IMPLANT
DRILL NEURO 2X3.1 SOFT TOUCH (MISCELLANEOUS) ×3
ELECT REM PT RETURN 9FT ADLT (ELECTROSURGICAL) ×3
ELECTRODE REM PT RTRN 9FT ADLT (ELECTROSURGICAL) ×1 IMPLANT
GAUZE SPONGE 4X4 12PLY STRL (GAUZE/BANDAGES/DRESSINGS) ×1 IMPLANT
GAUZE SPONGE 4X4 12PLY STRL LF (GAUZE/BANDAGES/DRESSINGS) ×2 IMPLANT
GAUZE SPONGE 4X4 16PLY XRAY LF (GAUZE/BANDAGES/DRESSINGS) ×2 IMPLANT
GLOVE BIO SURGEON STRL SZ8 (GLOVE) ×1 IMPLANT
GLOVE BIO SURGEON STRL SZ8.5 (GLOVE) ×3 IMPLANT
GLOVE BIOGEL PI IND STRL 6.5 (GLOVE) IMPLANT
GLOVE BIOGEL PI INDICATOR 6.5 (GLOVE) ×2
GLOVE ECLIPSE 9.0 STRL (GLOVE) ×2 IMPLANT
GLOVE EXAM NITRILE LRG STRL (GLOVE) IMPLANT
GLOVE EXAM NITRILE XL STR (GLOVE) IMPLANT
GLOVE EXAM NITRILE XS STR PU (GLOVE) IMPLANT
GLOVE SS PI 9.0 STRL (GLOVE) ×2 IMPLANT
GLOVE SURG SS PI 6.5 STRL IVOR (GLOVE) ×2 IMPLANT
GLOVE SURG SS PI 8.0 STRL IVOR (GLOVE) ×2 IMPLANT
GLOVE SURG SS PI 8.5 STRL IVOR (GLOVE) ×2
GLOVE SURG SS PI 8.5 STRL STRW (GLOVE) IMPLANT
GOWN STRL REUS W/ TWL LRG LVL3 (GOWN DISPOSABLE) IMPLANT
GOWN STRL REUS W/ TWL XL LVL3 (GOWN DISPOSABLE) IMPLANT
GOWN STRL REUS W/TWL LRG LVL3 (GOWN DISPOSABLE) ×3
GOWN STRL REUS W/TWL XL LVL3 (GOWN DISPOSABLE) ×6
HEMOSTAT POWDER KIT SURGIFOAM (HEMOSTASIS) ×3 IMPLANT
KIT BASIN OR (CUSTOM PROCEDURE TRAY) ×3 IMPLANT
KIT ROOM TURNOVER OR (KITS) ×3 IMPLANT
MARKER SKIN DUAL TIP RULER LAB (MISCELLANEOUS) ×3 IMPLANT
NDL SPNL 18GX3.5 QUINCKE PK (NEEDLE) ×1 IMPLANT
NEEDLE HYPO 22GX1.5 SAFETY (NEEDLE) ×3 IMPLANT
NEEDLE SPNL 18GX3.5 QUINCKE PK (NEEDLE) ×3 IMPLANT
NS IRRIG 1000ML POUR BTL (IV SOLUTION) ×3 IMPLANT
OIL CARTRIDGE MAESTRO DRILL (MISCELLANEOUS) ×3
PACK LAMINECTOMY NEURO (CUSTOM PROCEDURE TRAY) ×3 IMPLANT
PATTIES SURGICAL 1X1 (DISPOSABLE) ×2 IMPLANT
PEEK OPTIMA VISTA-S 11X14X5MM (Peek) ×2 IMPLANT
PIN DISTRACTION 14MM (PIN) ×6 IMPLANT
PLATE ANT CERV XTEND 3 LV 42 (Plate) ×2 IMPLANT
PUTTY KINEX BIOACTIVE 5CC (Bone Implant) ×2 IMPLANT
RUBBERBAND STERILE (MISCELLANEOUS) IMPLANT
SCREW XTD VAR 4.2 SELF TAP 12 (Screw) ×16 IMPLANT
SPONGE INTESTINAL PEANUT (DISPOSABLE) ×6 IMPLANT
SPONGE SURGIFOAM ABS GEL 100 (HEMOSTASIS) ×3 IMPLANT
STRIP CLOSURE SKIN 1/2X4 (GAUZE/BANDAGES/DRESSINGS) ×2 IMPLANT
SUT VIC AB 0 CT1 27 (SUTURE) ×3
SUT VIC AB 0 CT1 27XBRD ANTBC (SUTURE) ×1 IMPLANT
SUT VIC AB 3-0 SH 8-18 (SUTURE) ×5 IMPLANT
TAPE CLOTH SURG 4X10 WHT LF (GAUZE/BANDAGES/DRESSINGS) ×2 IMPLANT
TOWEL GREEN STERILE (TOWEL DISPOSABLE) ×3 IMPLANT
TOWEL GREEN STERILE FF (TOWEL DISPOSABLE) ×3 IMPLANT
WATER STERILE IRR 1000ML POUR (IV SOLUTION) ×3 IMPLANT

## 2017-05-07 NOTE — Anesthesia Postprocedure Evaluation (Signed)
Anesthesia Post Note  Patient: MOLLYE GUINTA  Procedure(s) Performed: Procedure(s) (LRB): ANTERIOR CERVICAL DECOMPRESSION/DISCECTOMY FUSION CERVICAL FOUR- CERVICAL FIVE, CERVICAL FIVE- CERVICAL SIX, CERVICAL SIX- CERVICAL SEVEN (N/A)     Patient location during evaluation: PACU Anesthesia Type: General Level of consciousness: sedated Pain management: pain level controlled Vital Signs Assessment: post-procedure vital signs reviewed and stable Respiratory status: spontaneous breathing and respiratory function stable Cardiovascular status: stable Anesthetic complications: no    Last Vitals:  Vitals:   05/07/17 1635 05/07/17 1650  BP: 108/60 96/60  Pulse: 78 77  Resp: 13 13  Temp:      Last Pain:  Vitals:   05/07/17 1650  TempSrc:   PainSc: 0-No pain                 Grethel Zenk DANIEL

## 2017-05-07 NOTE — Progress Notes (Signed)
Subjective:  The patient is somnolent but easily arousable. She looks well. She is in no apparent distress.  Objective: Vital signs in last 24 hours: Temp:  [97.7 F (36.5 C)-98.2 F (36.8 C)] 98.2 F (36.8 C) (07/23 1620) Pulse Rate:  [60-91] 91 (07/23 1620) Resp:  [12-18] 12 (07/23 1620) BP: (100-131)/(68-77) 100/68 (07/23 1620) SpO2:  [93 %-98 %] 93 % (07/23 1620) Weight:  [63 kg (138 lb 14.4 oz)] 63 kg (138 lb 14.4 oz) (07/23 1053)  Intake/Output from previous day: No intake/output data recorded. Intake/Output this shift: Total I/O In: 1600 [I.V.:1600] Out: 330 [Urine:140; Blood:190]  Physical exam the patient is somnolent but arousable. Her strength is grossly normal in about deltoid, bicep, and hand grip. She is moving her lower extremities well.  The patient's dressing is clean and dry. There is no evidence of hematoma or shift.  Lab Results: No results for input(s): WBC, HGB, HCT, PLT in the last 72 hours. BMET No results for input(s): NA, K, CL, CO2, GLUCOSE, BUN, CREATININE, CALCIUM in the last 72 hours.  Studies/Results: Dg Cervical Spine 2-3 Views  Result Date: 05/07/2017 CLINICAL DATA:  ACDF C4-5, 5-6, 6-7. 2 images obtained. Radiation safety timeout performed. EXAM: CERVICAL SPINE - 2-3 VIEW COMPARISON:  None. FINDINGS: Two AP views are provided. Final view shows interval placement of anterior cervical fusion hardware at the C4 through C7 levels, appropriately positioned with intervening disc spacers/cages. IMPRESSION: Anterior cervical fusion hardware placement at the C4 through C7 levels, appropriately positioned on this lateral view. No evidence of surgical complicating feature. Electronically Signed   By: Franki Cabot M.D.   On: 05/07/2017 16:09    Assessment/Plan: The patient is doing well. I spoke with the family.  LOS: 0 days     Dione Mccombie D 05/07/2017, 4:44 PM

## 2017-05-07 NOTE — Anesthesia Procedure Notes (Signed)
Procedure Name: Intubation Date/Time: 05/07/2017 1:12 PM Performed by: Salli Quarry Tequila Rottmann Pre-anesthesia Checklist: Patient identified, Emergency Drugs available, Suction available and Patient being monitored Patient Re-evaluated:Patient Re-evaluated prior to induction Oxygen Delivery Method: Circle System Utilized Preoxygenation: Pre-oxygenation with 100% oxygen Induction Type: IV induction Ventilation: Mask ventilation without difficulty Laryngoscope Size: Mac and 3 Grade View: Grade II Tube type: Oral Tube size: 7.0 mm Number of attempts: 1 Airway Equipment and Method: Stylet Placement Confirmation: ETT inserted through vocal cords under direct vision,  positive ETCO2 and breath sounds checked- equal and bilateral Secured at: 22 cm Tube secured with: Tape Dental Injury: Teeth and Oropharynx as per pre-operative assessment

## 2017-05-07 NOTE — H&P (Signed)
Subjective: The patient is a 71 year old white female who has complained of neck, shoulder, and arm pain consistent with a cervical radiculopathy. She has failed medical management and was worked up with a cervical MRI. This demonstrated spondylosis and stenosis at C4-5, C5-6 and C6-7. We discussed the various treatment options. She has decided to proceed with surgery after weighing the risks, benefits, and alternatives.   Past Medical History:  Diagnosis Date  . Anxiety   . Chronic pain syndrome   . Degenerative disc disease   . Degenerative joint disease   . Essential hypertension, benign   . GERD (gastroesophageal reflux disease)   . Hyperlipidemia   . Insomnia   . Osteoarthritis   . Palpitations   . Restless leg syndrome   . Thyroid disease   . TIA (transient ischemic attack)     Past Surgical History:  Procedure Laterality Date  . ABDOMINAL HYSTERECTOMY    . APPENDECTOMY    . CATARACT EXTRACTION, BILATERAL    . CERVICAL SPINE SURGERY    . COLONOSCOPY  02/2013  . HEMORRHOID SURGERY  03/2013  . LUMBAR SPINE SURGERY     x 3    Allergies  Allergen Reactions  . Erythromycin Swelling  . Latex Other (See Comments)    Blisters  . Morphine Sulfate Other (See Comments)    Hallucinations/deep sleep  . Clozapine Rash  . Fentanyl Nausea Only    "Feels like you are a hundred miles away when talking to her"    Social History  Substance Use Topics  . Smoking status: Current Every Day Smoker    Packs/day: 0.75    Years: 30.00    Types: Cigarettes  . Smokeless tobacco: Never Used  . Alcohol use 0.6 oz/week    1 Standard drinks or equivalent per week     Comment: rarely    Family History  Problem Relation Age of Onset  . Hypertension Mother   . Hypertension Father   . Heart attack Father 62  . Heart attack Brother 73   Prior to Admission medications   Medication Sig Start Date End Date Taking? Authorizing Provider  alendronate (FOSAMAX) 70 MG tablet Take 70 mg by mouth  every Saturday. Saturday 05/05/11  Yes [provider]  aspirin EC 81 MG tablet Take 81 mg by mouth daily.   Yes [provider]  Aspirin-Caffeine (BC FAST PAIN RELIEF ARTHRITIS) 1000-65 MG PACK Take 1 packet by mouth 2 (two) times daily.   Yes [provider]  budesonide-formoterol (SYMBICORT) 160-4.5 MCG/ACT inhaler Inhale 2 puffs into the lungs daily as needed. Patient taking differently: Inhale 2 puffs into the lungs 2 (two) times daily.  06/08/15  Yes Minus Breeding, MD  calcium carbonate (OS-CAL) 600 MG TABS Take 600 mg by mouth daily.    Yes [provider]  cyanocobalamin 500 MCG tablet Take 500 mcg by mouth daily.    Yes [provider]  diltiazem (CARDIZEM) 30 MG tablet TAKE ONE TABLET BY MOUTH AT BEDTIME 02/08/17  Yes Crenshaw, Denice Bors, MD  DULoxetine (CYMBALTA) 30 MG capsule Take 1 capsule (30 mg total) by mouth daily. 09/04/14  Yes Kirsteins, Luanna Salk, MD  gabapentin (NEURONTIN) 300 MG capsule Take 300 mg by mouth 2 (two) times daily.    Yes [provider]  hydrochlorothiazide 25 MG tablet Take 25 mg by mouth every evening.    Yes [provider]  levothyroxine (SYNTHROID, LEVOTHROID) 100 MCG tablet Take 100 mcg by mouth every Saturday.  In the morning. 11/12/16  Yes [provider]  meloxicam (MOBIC) 7.5 MG tablet Take 1 tablet (7.5 mg total) by mouth daily. 03/16/17  Yes Kirsteins, Luanna Salk, MD  Omega-3 Fatty Acids (FISH OIL) 1000 MG CAPS Take 1,000 mg by mouth daily.    Yes [provider]  pravastatin (PRAVACHOL) 40 MG tablet Take 40 mg by mouth at bedtime.  01/20/15  Yes [provider]  pregabalin (LYRICA) 50 MG capsule Take 1 capsule (50 mg total) by mouth 3 (three) times daily. Patient taking differently: Take 50 mg by mouth 2 (two) times daily.  02/02/17  Yes Kirsteins, Luanna Salk, MD  traMADol (ULTRAM) 50 MG tablet Take 1 tablet (50 mg total) by mouth 2 (two) times daily. 05/04/17  Yes Kirsteins,  Luanna Salk, MD  traZODone (DESYREL) 150 MG tablet Take 150 mg by mouth at bedtime.  11/09/15  Yes [provider]  acetaminophen-codeine (TYLENOL #3) 300-30 MG tablet Take 1 tablet by mouth every 4 (four) hours as needed for moderate pain. 03/16/17   Kirsteins, Luanna Salk, MD  diazepam (VALIUM) 5 MG tablet Take 1 by mouth 1 to 2 hours pre-procedure. May repeat if necessary. 04/03/17   Magnus Sinning, MD  GAVILYTE-N WITH FLAVOR PACK 420 G solution Take 4,000 mLs by mouth once.  02/10/13   [provider]  PROCTOSOL HC 2.5 % rectal cream Place 1 application rectally daily.  01/01/13   [provider]     Review of Systems  Positive ROS: As above  All other systems have been reviewed and were otherwise negative with the exception of those mentioned in the HPI and as above.  Objective: Vital signs in last 24 hours: Temp:  [97.7 F (36.5 C)] 97.7 F (36.5 C) (07/23 1053) Pulse Rate:  [60] 60 (07/23 1053) Resp:  [18] 18 (07/23 1053) BP: (131)/(77) 131/77 (07/23 1053) SpO2:  [98 %] 98 % (07/23 1053) Weight:  [63 kg (138 lb 14.4 oz)] 63 kg (138 lb 14.4 oz) (07/23 1053)  General Appearance: Alert Head: Normocephalic, without obvious abnormality, atraumatic Eyes: PERRL, conjunctiva/corneas clear, EOM's intact,    Ears: Normal  Throat: Normal  Neck: Supple, the patient has limited cervical range of motion. Spurling's testing is positive. Back: unremarkable Lungs: Clear to auscultation bilaterally, respirations unlabored Heart: Regular rate and rhythm, no murmur, rub or gallop Abdomen: Soft, non-tender Extremities: Extremities normal, atraumatic, no cyanosis or edema Skin: unremarkable  NEUROLOGIC:   Mental status: alert and oriented,Motor Exam - grossly normal Sensory Exam - grossly normal Reflexes:  Coordination - grossly normal Gait - grossly normal Balance - grossly normal Cranial Nerves: I: smell Not tested  II: visual acuity  OS: Normal  OD: Normal   II:  visual fields Full to confrontation  II: pupils Equal, round, reactive to light  III,VII: ptosis None  III,IV,VI: extraocular muscles  Full ROM  V: mastication Normal  V: facial light touch sensation  Normal  V,VII: corneal reflex  Present  VII: facial muscle function - upper  Normal  VII: facial muscle function - lower Normal  VIII: hearing Not tested  IX: soft palate elevation  Normal  IX,X: gag reflex Present  XI: trapezius strength  5/5  XI: sternocleidomastoid strength 5/5  XI: neck flexion strength  5/5  XII: tongue strength  Normal    Data Review Lab Results  Component Value Date   WBC 7.6 05/04/2017   HGB 15.2 (H) 05/04/2017   HCT 45.5 05/04/2017   MCV  90.5 05/04/2017   PLT 256 05/04/2017   Lab Results  Component Value Date   NA 137 05/04/2017   K 3.6 05/04/2017   CL 101 05/04/2017   CO2 25 05/04/2017   BUN 14 05/04/2017   CREATININE 0.92 05/04/2017   GLUCOSE 94 05/04/2017   Lab Results  Component Value Date   INR 1.0 03/23/2009    Assessment/Plan: C4-5, C5-6 and C6-7 disc degeneration, spondylosis, stenosis, cervical radiculopathy, cervicalgia: I have discussed the situation with the patient and reviewed her MRI scan with her. We have discussed the various treatment options including surgery. I have described the surgical treatment option of a C4-5, C5-6 and C6-7 anterior cervical discectomy, fusion, and plating. I have shown her surgical models. We have discussed the risks, benefits, alternatives, expected postoperative course, and likelihood of achieving her goals with surgery. I have answered all patient's questions. She has decided to proceed with surgery.   Shiheem Corporan D 05/07/2017 12:54 PM

## 2017-05-07 NOTE — Op Note (Signed)
Brief history: The patient is a 71 year old white female who has complained of neck, shoulder and arm pain consistent with a cervical radiculopathy. She has failed medical management and was worked up with a cervical MRI. This demonstrated multilevel disc degeneration and spondylosis. I discussed the various treatment options with the patient and her husband including surgery. She has weighed the risks, benefits, and alternatives to surgery and decided proceed with the C4-5, C5-6 and C6-7 anterior cervical discectomy, fusion, and plating.  Preoperative diagnosis: C4-5, C5-6 and C6-7 disc degeneration, spondylosis, stenosis, cervical radiculopathy  Postoperative diagnosis: The same  Procedure: C4-5, C5-6 and C6-7 Anterior cervical discectomy/decompression; C4-5, C5-6 and C6-7 interbody arthrodesis with local morcellized autograft bone and Kinnex bone graft extender; insertion of interbody prosthesis at C4-5, C5-6 and C6-7 (Zimmer peek interbody prosthesis); anterior cervical plating from C4-C7 with globus titanium plate  Surgeon: Dr. Earle Gell  Asst.: Dr. Annette Stable  Anesthesia: Gen. endotracheal  Estimated blood loss: 125 mL  Drains: None  Complications: None  Description of procedure: The patient was brought to the operating room by the anesthesia team. General endotracheal anesthesia was induced. A roll was placed under the patient's shoulders to keep the neck in the neutral position. The patient's anterior cervical region was then prepared with Betadine scrub and Betadine solution. Sterile drapes were applied.  The area to be incised was then injected with Marcaine with epinephrine solution. I then used a scalpel to make a transverse incision in the patient's left anterior neck. I used the Metzenbaum scissors to divide the platysmal muscle and then to dissect medial to the sternocleidomastoid muscle, jugular vein, and carotid artery. I carefully dissected down towards the anterior cervical  spine identifying the esophagus and retracting it medially. Then using Kitner swabs to clear soft tissue from the anterior cervical spine. We then inserted a bent spinal needle into the upper exposed intervertebral disc space. We then obtained intraoperative radiographs confirm our location.  I then used electrocautery to detach the medial border of the longus colli muscle bilaterally from the C4-5, C5-6 and C6-7 intervertebral disc spaces. I then inserted the Caspar self-retaining retractor underneath the longus colli muscle bilaterally to provide exposure.  We then incised the intervertebral disc at C4-5. We then performed a partial intervertebral discectomy with a pituitary forceps and the Karlin curettes. I then inserted distraction screws into the vertebral bodies at C4-5. We then distracted the interspace. We then used the high-speed drill to decorticate the vertebral endplates at O2-7, to drill away the remainder of the intervertebral disc, to drill away some posterior spondylosis, and to thin out the posterior longitudinal ligament. I then incised ligament with the arachnoid knife. We then removed the ligament with a Kerrison punches undercutting the vertebral endplates and decompressing the thecal sac. We then performed foraminotomies about the bilateral C5 nerve roots. This completed the decompression at this level.  We then repeated this procedure and analogous fashion at C5-6 and C6-7 decompressing the thecal sac at these levels as well as the bilateral C6 and C7 nerve roots.  We now turned our to attention to the interbody fusion. We used the trial spacers to determine the appropriate size for the interbody prosthesis. We then pre-filled prosthesis with a combination of local morcellized autograft bone that we obtained during decompression as well as Kinnex bone graft extender. We then inserted the prosthesis into the distracted interspace at C4-5, C5-6 and C6-7. We then removed the distraction  screws. There was a good snug fit of  the prosthesis in the interspace.  Having completed the fusion we now turned attention to the anterior spinal instrumentation. We used the high-speed drill to drill away some anterior spondylosis at the disc spaces so that the plate lay down flat. We selected the appropriate length titanium anterior cervical plate. We laid it along the anterior aspect of the vertebral bodies from C4-C7. We then drilled 12 mm holes at C4, C5, C6 and C7. We then secured the plate to the vertebral bodies by placing two 12 mm self-tapping screws at C4, C5, C6 and C7. We then obtained intraoperative radiograph. The demonstrating good position of the instrumentation. We therefore secured the screws the plate the locking each cam. This completed the instrumentation.  We then obtained hemostasis using bipolar electrocautery. We irrigated the wound out with bacitracin solution. We then removed the retractor. We inspected the esophagus for any damage. There was none apparent. We then reapproximated patient's platysmal muscle with interrupted 3-0 Vicryl suture. We then reapproximated the subcutaneous tissue with interrupted 3-0 Vicryl suture. The skin was reapproximated with Steri-Strips and benzoin. The wound was then covered with bacitracin ointment. A sterile dressing was applied. The drapes were removed. Patient was subsequently extubated by the anesthesia team and transported to the post anesthesia care unit in stable condition. All sponge instrument and needle counts were reportedly correct at the end of this case.

## 2017-05-07 NOTE — Progress Notes (Signed)
Orthopedic Tech Progress Note Patient Details:  Emily Leonard 1946-09-10 [id number]  Ortho Devices Type of Ortho Device: Soft collar Ortho Device/Splint Location: applied Apply soft cervical collar (Cervical soft collar) to pt neck.  pt tolerated application very well.  Ortho Device/Splint Interventions: Application, Adjustment   Kristopher Oppenheim 05/07/2017, 7:21 PM

## 2017-05-07 NOTE — Anesthesia Preprocedure Evaluation (Addendum)
Anesthesia Evaluation  Patient identified by MRN, date of birth, ID band Patient awake    Reviewed: Allergy & Precautions, NPO status , Patient's Chart, lab work & pertinent test results  History of Anesthesia Complications Negative for: history of anesthetic complications  Airway Mallampati: II  TM Distance: >3 FB Neck ROM: Limited    Dental no notable dental hx. (+) Dental Advisory Given, Partial Lower   Pulmonary Current Smoker,    Pulmonary exam normal        Cardiovascular hypertension, Normal cardiovascular exam     Neuro/Psych Anxiety TIA   GI/Hepatic Neg liver ROS, GERD  ,  Endo/Other  Hypothyroidism   Renal/GU negative Renal ROS     Musculoskeletal negative musculoskeletal ROS (+)   Abdominal   Peds  Hematology negative hematology ROS (+)   Anesthesia Other Findings Day of surgery medications reviewed with the patient.  Reproductive/Obstetrics                           Anesthesia Physical Anesthesia Plan  ASA: III  Anesthesia Plan: General   Post-op Pain Management:    Induction: Intravenous  PONV Risk Score and Plan: 3 and Ondansetron, Dexamethasone and Scopolamine patch - Pre-op  Airway Management Planned: Oral ETT  Additional Equipment:   Intra-op Plan:   Post-operative Plan: Extubation in OR  Informed Consent: I have reviewed the patients History and Physical, chart, labs and discussed the procedure including the risks, benefits and alternatives for the proposed anesthesia with the patient or authorized representative who has indicated his/her understanding and acceptance.   Dental advisory given  Plan Discussed with: Anesthesiologist, CRNA and Surgeon  Anesthesia Plan Comments:        Anesthesia Quick Evaluation

## 2017-05-07 NOTE — Transfer of Care (Signed)
Immediate Anesthesia Transfer of Care Note  Patient: TAHJANAE BLANKENBURG  Procedure(s) Performed: Procedure(s): ANTERIOR CERVICAL DECOMPRESSION/DISCECTOMY FUSION CERVICAL FOUR- CERVICAL FIVE, CERVICAL FIVE- CERVICAL SIX, CERVICAL SIX- CERVICAL SEVEN (N/A)  Patient Location: PACU  Anesthesia Type:General  Level of Consciousness: awake, alert , oriented and patient cooperative  Airway & Oxygen Therapy: Patient Spontanous Breathing and Patient connected to nasal cannula oxygen  Post-op Assessment: Report given to RN and Post -op Vital signs reviewed and stable  Post vital signs: Reviewed and stable  Last Vitals:  Vitals:   05/07/17 1053 05/07/17 1620  BP: 131/77 100/68  Pulse: 60 91  Resp: 18 12  Temp: 36.5 C 36.8 C    Last Pain:  Vitals:   05/07/17 1620  TempSrc:   PainSc: (P) 0-No pain         Complications: No apparent anesthesia complications

## 2017-05-08 ENCOUNTER — Encounter (HOSPITAL_COMMUNITY): Payer: Self-pay | Admitting: Neurosurgery

## 2017-05-08 MED ORDER — OXYCODONE HCL 5 MG PO TABS: 5.0000 mg | ORAL_TABLET | ORAL | 0 refills | Status: DC | PRN

## 2017-05-08 MED ORDER — CYCLOBENZAPRINE HCL 10 MG PO TABS: 10.0000 mg | ORAL_TABLET | Freq: Three times a day (TID) | ORAL | Status: DC | PRN

## 2017-05-08 MED ORDER — DOCUSATE SODIUM 100 MG PO CAPS: 100.0000 mg | ORAL_CAPSULE | Freq: Two times a day (BID) | ORAL | 0 refills | Status: DC

## 2017-05-08 MED ORDER — CYCLOBENZAPRINE HCL 10 MG PO TABS: 10.0000 mg | ORAL_TABLET | Freq: Three times a day (TID) | ORAL | 0 refills | Status: DC | PRN

## 2017-05-08 NOTE — Discharge Summary (Signed)
Physician Discharge Summary  Patient ID: Emily Leonard MRN: [id number] DOB/AGE: Feb 15, 1946 71 y.o.  Admit date: 05/07/2017 Discharge date: 05/08/2017  Admission Diagnoses:C4-5, C5-6 and C6-7 disc degeneration, spondylosis, stenosis, cervical radiculopathy, cervicalgia  Discharge Diagnoses:  The same Active Problems:   Cervical spondylosis with radiculopathy   Discharged Condition: good  Hospital Course:  I performed a C4-5, C5-6 and C6-7 anterior cervical diskectomy, fusion, and plating on the patient on 05/07/2017.  The surgery went well.  The patient's postoperative course was unremarkable.  On postoperative day 1  The patient requested discharge to home. The patient, and her daughter, were given written and oral discharge instructions.  All their questions were answered.  Consults:  None Significant Diagnostic Studies: none Treatments: C4-5, C5-6 and C6-7 anterior cervical diskectomy, fusion, and plating. Discharge Exam: Blood pressure 109/60, pulse 60, temperature 98.2 F (36.8 C), resp. rate 16, height 5\' 4"  (1.626 m), weight 63 kg (138 lb 14.4 oz), SpO2 92 %.   The patient is alert and pleasant.  Her strength is normal in all 4 extremities.  Her dressing is clean and dry.  There is no hematoma or shift.  Disposition:   Home  Discharge Instructions    Call MD for:  difficulty breathing, headache or visual disturbances    Complete by:  As directed    Call MD for:  extreme fatigue    Complete by:  As directed    Call MD for:  hives    Complete by:  As directed    Call MD for:  persistant dizziness or light-headedness    Complete by:  As directed    Call MD for:  persistant nausea and vomiting    Complete by:  As directed    Call MD for:  redness, tenderness, or signs of infection (pain, swelling, redness, odor or green/yellow discharge around incision site)    Complete by:  As directed    Call MD for:  severe uncontrolled pain    Complete by:  As directed    Call MD  for:  temperature >100.4    Complete by:  As directed    Diet - low sodium heart healthy    Complete by:  As directed    Discharge instructions    Complete by:  As directed    Call [phone] for a followup appointment. Take a stool softener while you are using pain medications.   Driving Restrictions    Complete by:  As directed    Do not drive for 2 weeks.   Increase activity slowly    Complete by:  As directed    Lifting restrictions    Complete by:  As directed    Do not lift more than 5 pounds. No excessive bending or twisting.   May shower / Bathe    Complete by:  As directed    He may shower after the pain she is removed 3 days after surgery. Leave the incision alone.   Remove dressing in 48 hours    Complete by:  As directed    Your stitches are under the scan and will dissolve by themselves. The Steri-Strips will fall off after you take a few showers. Do not rub back or pick at the wound, Leave the wound alone.     Allergies as of 05/08/2017      Reactions   Erythromycin Swelling   Latex Other (See Comments)   Blisters   Morphine Sulfate Other (See Comments)   Hallucinations/deep  sleep   Clozapine Rash   Fentanyl Nausea Only   "Feels like you are a hundred miles away when talking to her"      Medication List    STOP taking these medications   acetaminophen-codeine 300-30 MG tablet Commonly known as:  TYLENOL #3   diazepam 5 MG tablet Commonly known as:  VALIUM   meloxicam 7.5 MG tablet Commonly known as:  MOBIC     TAKE these medications   alendronate 70 MG tablet Commonly known as:  FOSAMAX Take 70 mg by mouth every Saturday. Saturday   aspirin EC 81 MG tablet Take 81 mg by mouth daily.   BC FAST PAIN RELIEF ARTHRITIS 1000-65 MG Pack Generic drug:  Aspirin-Caffeine Take 1 packet by mouth 2 (two) times daily.   budesonide-formoterol 160-4.5 MCG/ACT inhaler Commonly known as:  SYMBICORT Inhale 2 puffs into the lungs daily as needed. What  changed:  when to take this   calcium carbonate 600 MG Tabs tablet Commonly known as:  OS-CAL Take 600 mg by mouth daily.   cyanocobalamin 500 MCG tablet Take 500 mcg by mouth daily.   cyclobenzaprine 10 MG tablet Commonly known as:  FLEXERIL Take 1 tablet (10 mg total) by mouth 3 (three) times daily as needed for muscle spasms.   diltiazem 30 MG tablet Commonly known as:  CARDIZEM TAKE ONE TABLET BY MOUTH AT BEDTIME   docusate sodium 100 MG capsule Commonly known as:  COLACE Take 1 capsule (100 mg total) by mouth 2 (two) times daily.   DULoxetine 30 MG capsule Commonly known as:  CYMBALTA Take 1 capsule (30 mg total) by mouth daily.   Fish Oil 1000 MG Caps Take 1,000 mg by mouth daily.   gabapentin 300 MG capsule Commonly known as:  NEURONTIN Take 300 mg by mouth 2 (two) times daily.   GAVILYTE-N WITH FLAVOR PACK 420 g solution Generic drug:  polyethylene glycol-electrolytes Take 4,000 mLs by mouth once.   hydrochlorothiazide 25 MG tablet Commonly known as:  HYDRODIURIL Take 25 mg by mouth every evening.   levothyroxine 100 MCG tablet Commonly known as:  SYNTHROID, LEVOTHROID Take 100 mcg by mouth every Saturday. In the morning.   oxyCODONE 5 MG immediate release tablet Commonly known as:  Oxy IR/ROXICODONE Take 1-2 tablets (5-10 mg total) by mouth every 3 (three) hours as needed for breakthrough pain.   pravastatin 40 MG tablet Commonly known as:  PRAVACHOL Take 40 mg by mouth at bedtime.   pregabalin 50 MG capsule Commonly known as:  LYRICA Take 1 capsule (50 mg total) by mouth 3 (three) times daily. What changed:  when to take this   PROCTOSOL HC 2.5 % rectal cream Generic drug:  hydrocortisone Place 1 application rectally daily.   traMADol 50 MG tablet Commonly known as:  ULTRAM Take 1 tablet (50 mg total) by mouth 2 (two) times daily.   traZODone 150 MG tablet Commonly known as:  DESYREL Take 150 mg by mouth at bedtime.      Follow-up  Information    Newman Pies, MD Follow up in 2 week(s).   Specialty:  Neurosurgery Contact information: [address] [place] [address] [phone]           Signed: Ophelia Charter 05/08/2017, 12:51 PM

## 2017-05-08 NOTE — Progress Notes (Signed)
Pt doing well. Pt and daughter given D/C instructions with Rx's, verbal understanding was provided. Pt's dressing was changed prior to D/C, incision has no sign of infection. Pt's IV was removed prior to D/C. Pt D/C'd home via walking @ 0930 per MD order. Pt is stable @ D/C and has no other needs at this time. Holli Humbles, RN

## 2017-05-08 NOTE — Discharge Instructions (Signed)

## 2017-05-27 ENCOUNTER — Other Ambulatory Visit: Payer: Self-pay | Admitting: Physical Medicine & Rehabilitation

## 2017-06-01 ENCOUNTER — Telehealth (INDEPENDENT_AMBULATORY_CARE_PROVIDER_SITE_OTHER): Payer: Self-pay

## 2017-06-01 NOTE — Telephone Encounter (Signed)
Patient is requesting another injection for her neck. Had Lt C7-T1 IL on 03/23/17.

## 2017-06-01 NOTE — Telephone Encounter (Signed)
Sorry, she just had ACDF by Dr. Arnoldo Morale on 05/07/2017, she needs follow up with him with decision making on next step

## 2017-06-01 NOTE — Telephone Encounter (Signed)
Spoke with pt and advised. She will f/u with Dr. Arnoldo Morale.

## 2017-06-08 ENCOUNTER — Other Ambulatory Visit: Payer: Self-pay | Admitting: Neurosurgery

## 2017-06-19 ENCOUNTER — Encounter (HOSPITAL_COMMUNITY): Payer: Self-pay

## 2017-06-19 ENCOUNTER — Encounter (HOSPITAL_COMMUNITY)
Admission: RE | Admit: 2017-06-19 | Discharge: 2017-06-19 | Disposition: A | Discharge status: Home / Self Care | Payer: Medicare Other | Source: Ambulatory Visit | Attending: Neurosurgery | Admitting: Neurosurgery

## 2017-06-19 HISTORY — DX: Hypothyroidism, unspecified: E03.9

## 2017-06-19 HISTORY — DX: Chronic obstructive pulmonary disease, unspecified: J44.9

## 2017-06-19 HISTORY — DX: Depression, unspecified: F32.A

## 2017-06-19 HISTORY — DX: Major depressive disorder, single episode, unspecified: F32.9

## 2017-06-19 LAB — BASIC METABOLIC PANEL
ANION GAP: 8 (ref 5–15)
BUN: 18 mg/dL (ref 6–20)
CHLORIDE: 104 mmol/L (ref 101–111)
CO2: 26 mmol/L (ref 22–32)
Calcium: 9.8 mg/dL (ref 8.9–10.3)
Creatinine, Ser: 0.97 mg/dL (ref 0.44–1.00)
GFR calc non Af Amer: 57 mL/min — ABNORMAL LOW (ref >60)
GLUCOSE: 100 mg/dL — AB (ref 65–99)
Potassium: 4 mmol/L (ref 3.5–5.1)
Sodium: 138 mmol/L (ref 135–145)

## 2017-06-19 LAB — CBC
HEMATOCRIT: 46 % (ref 36.0–46.0)
HEMOGLOBIN: 14.8 g/dL (ref 12.0–15.0)
MCH: 29 pg (ref 26.0–34.0)
MCHC: 32.2 g/dL (ref 30.0–36.0)
MCV: 90.2 fL (ref 78.0–100.0)
Platelets: 309 10*3/uL (ref 150–400)
RBC: 5.1 MIL/uL (ref 3.87–5.11)
RDW: 15.7 % — AB (ref 11.5–15.5)
WBC: 6.4 10*3/uL (ref 4.0–10.5)

## 2017-06-19 LAB — TYPE AND SCREEN
ABO/RH(D): O POS
ANTIBODY SCREEN: NEGATIVE

## 2017-06-19 LAB — SURGICAL PCR SCREEN
MRSA, PCR: NEGATIVE
Staphylococcus aureus: NEGATIVE

## 2017-06-19 NOTE — Progress Notes (Signed)
PCP: Dr. Jene Every @ [facility] in [place], [place]  Cardiologist: Dr. Jenkins Rouge

## 2017-06-19 NOTE — Pre-Procedure Instructions (Signed)
Emily Leonard  06/19/2017      [place], [place] - [address] [place] [address] Phone: [phone] Fax: [phone]    Your procedure is scheduled on Thurs. Sept. 6  Report to [facility] Admitting at 11:00 A.M.  Call this number if you have problems the morning of surgery:  [phone]   Remember:  Do not eat food or drink liquids after midnight on Wed. Sept. 5   Take these medicines the morning of surgery with A SIP OF WATER : cymbalta, gabapentin (neurontin), levothyroxine (synthroid), oxycodone  Or tramadol if needed, lyrica              Stop aspirin, advil, motrin, aleve, ibuprofen, BC Powders, Goody;s  Vitamins and herbal medicines: fish oil   Do not wear jewelry, make-up or nail polish.  Do not wear lotions, powders, or perfumes, or deoderant.  Do not shave 48 hours prior to surgery.  Men may shave face and neck.  Do not bring valuables to the hospital.  [facility] is not responsible for any belongings or valuables.  Contacts, dentures or bridgework may not be worn into surgery.  Leave your suitcase in the car.  After surgery it may be brought to your room.  For patients admitted to the hospital, discharge time will be determined by your treatment team.  Patients discharged the day of surgery will not be allowed to drive home.    Special instructions:   [facility]- Preparing For Surgery  Before surgery, you can play an important role. Because skin is not sterile, your skin needs to be as free of germs as possible. You can reduce the number of germs on your skin by washing with CHG (chlorahexidine gluconate) Soap before surgery.  CHG is an antiseptic cleaner which kills germs and bonds with the skin to continue killing germs even after washing.  Please do not use if you have an allergy to CHG or antibacterial soaps. If your skin becomes reddened/irritated stop using the CHG.  Do not shave  (including legs and underarms) for at least 48 hours prior to first CHG shower. It is OK to shave your face.  Please follow these instructions carefully.   1. Shower the NIGHT BEFORE SURGERY and the MORNING OF SURGERY with CHG.   2. If you chose to wash your hair, wash your hair first as usual with your normal shampoo.  3. After you shampoo, rinse your hair and body thoroughly to remove the shampoo.  4. Use CHG as you would any other liquid soap. You can apply CHG directly to the skin and wash gently with a scrungie or a clean washcloth.   5. Apply the CHG Soap to your body ONLY FROM THE NECK DOWN.  Do not use on open wounds or open sores. Avoid contact with your eyes, ears, mouth and genitals (private parts). Wash genitals (private parts) with your normal soap.  6. Wash thoroughly, paying special attention to the area where your surgery will be performed.  7. Thoroughly rinse your body with warm water from the neck down.  8. DO NOT shower/wash with your normal soap after using and rinsing off the CHG Soap.  9. Pat yourself dry with a CLEAN TOWEL.   10. Wear CLEAN PAJAMAS   11. Place CLEAN SHEETS on your bed the night of your first shower and DO NOT SLEEP WITH PETS.  Day of Surgery: Do not apply any deodorants/lotions. Please wear clean clothes to the hospital/surgery center.      Please read over the following fact sheets that you were given. Coughing and Deep Breathing, MRSA Information and Surgical Site Infection Prevention

## 2017-06-20 MED ORDER — CEFAZOLIN SODIUM-DEXTROSE 2-4 GM/100ML-% IV SOLN
2.0000 g | INTRAVENOUS | Status: AC
  Administered 2017-06-21: 2 g via INTRAVENOUS
  Filled 2017-06-20: qty 100

## 2017-06-20 NOTE — Progress Notes (Signed)
Anesthesia Chart Review:  Pt is a 71 year old female scheduled for C4-5, C5-6, C6-7 posterior cervical fusion with lateral mass fixation; revise anterior instrumentation C4-7 on 06/21/2017 with Newman Pies, MD  - PCP is Jene Every, MD (notes in care everywhere)  - Cardiologist is Minus Breeding, MD, last office visit 06/23/16 for palpitations, HTN.  - Pulmonologist is Marshell Garfinkel, MD, last office visit 02/04/16  PMH includes:  HTN, hyperlipidemia, palpitations, COPD, TIA, GERD.  Current smoker. BMI 24. S/p ACDF 05/07/17.   Medications include: ASA 81 mg, Symbicort, diltiazem, HCTZ, levothyroxine, pravastatin  Preoperative labs reviewed.    CXR 05/04/17: No active cardiopulmonary disease.  CT chest 01/20/16:  1. No evidence of primary malignancy. 2. Centrilobular emphysema. Atherosclerosis, including within the coronary arteries. No other explanation for shortness of Breath. 3. Minimal right middle lobe nodularity, felt to be partially calcified. Likely post infectious or inflammatory.  EKG 06/19/17: Sinus bradycardia (57 bpm). Septal infarct, age-indeterminate. Nonspecific ST abnormality. Appears stable when compared to EKG 06/23/16.   Nuclear stress test 06/04/14: Low risk stress nuclear study with small area of distal anterior breast attenuation artifact. No reversible ischemia. LV Wall Motion: NL LV Function; NL Wall Motion; EF 74%  Pt tolerated similar surgery about 6 weeks ago. If no changes, I anticipate pt can proceed with surgery as scheduled.   Emily Cass, FNP-BC [facility]/Anesthesiology Phone: [phone] 06/20/2017 9:18 AM

## 2017-06-21 ENCOUNTER — Inpatient Hospital Stay (HOSPITAL_COMMUNITY): Payer: Medicare Other | Admitting: Emergency Medicine

## 2017-06-21 ENCOUNTER — Encounter (HOSPITAL_COMMUNITY): Payer: Self-pay | Admitting: *Deleted

## 2017-06-21 ENCOUNTER — Encounter (HOSPITAL_COMMUNITY)
Admission: RE | Disposition: A | Discharge status: Home / Self Care | Payer: Self-pay | Source: Ambulatory Visit | Attending: Neurosurgery

## 2017-06-21 ENCOUNTER — Inpatient Hospital Stay (HOSPITAL_COMMUNITY): Payer: Medicare Other

## 2017-06-21 ENCOUNTER — Inpatient Hospital Stay (HOSPITAL_COMMUNITY): Payer: Medicare Other | Admitting: Certified Registered Nurse Anesthetist

## 2017-06-21 ENCOUNTER — Inpatient Hospital Stay (HOSPITAL_COMMUNITY)
Admission: RE | Admit: 2017-06-21 | Discharge: 2017-06-22 | DRG: 473 | Disposition: A | Discharge status: Home / Self Care | Payer: Medicare Other | Source: Ambulatory Visit | Attending: Neurosurgery | Admitting: Neurosurgery

## 2017-06-21 DIAGNOSIS — F17210 Nicotine dependence, cigarettes, uncomplicated: Secondary | ICD-10-CM | POA: Diagnosis present

## 2017-06-21 DIAGNOSIS — Z79899 Other long term (current) drug therapy: Secondary | ICD-10-CM | POA: Diagnosis not present

## 2017-06-21 DIAGNOSIS — E039 Hypothyroidism, unspecified: Secondary | ICD-10-CM | POA: Diagnosis present

## 2017-06-21 DIAGNOSIS — F419 Anxiety disorder, unspecified: Secondary | ICD-10-CM | POA: Diagnosis present

## 2017-06-21 DIAGNOSIS — Z7982 Long term (current) use of aspirin: Secondary | ICD-10-CM | POA: Diagnosis not present

## 2017-06-21 DIAGNOSIS — Y838 Other surgical procedures as the cause of abnormal reaction of the patient, or of later complication, without mention of misadventure at the time of the procedure: Secondary | ICD-10-CM | POA: Diagnosis present

## 2017-06-21 DIAGNOSIS — Z9841 Cataract extraction status, right eye: Secondary | ICD-10-CM | POA: Diagnosis not present

## 2017-06-21 DIAGNOSIS — M542 Cervicalgia: Secondary | ICD-10-CM | POA: Diagnosis present

## 2017-06-21 DIAGNOSIS — G894 Chronic pain syndrome: Secondary | ICD-10-CM | POA: Diagnosis present

## 2017-06-21 DIAGNOSIS — J449 Chronic obstructive pulmonary disease, unspecified: Secondary | ICD-10-CM | POA: Diagnosis present

## 2017-06-21 DIAGNOSIS — K219 Gastro-esophageal reflux disease without esophagitis: Secondary | ICD-10-CM | POA: Diagnosis present

## 2017-06-21 DIAGNOSIS — I10 Essential (primary) hypertension: Secondary | ICD-10-CM | POA: Diagnosis present

## 2017-06-21 DIAGNOSIS — F329 Major depressive disorder, single episode, unspecified: Secondary | ICD-10-CM | POA: Diagnosis present

## 2017-06-21 DIAGNOSIS — Z8673 Personal history of transient ischemic attack (TIA), and cerebral infarction without residual deficits: Secondary | ICD-10-CM | POA: Diagnosis not present

## 2017-06-21 DIAGNOSIS — M960 Pseudarthrosis after fusion or arthrodesis: Principal | ICD-10-CM | POA: Diagnosis present

## 2017-06-21 DIAGNOSIS — Z9842 Cataract extraction status, left eye: Secondary | ICD-10-CM

## 2017-06-21 DIAGNOSIS — E785 Hyperlipidemia, unspecified: Secondary | ICD-10-CM | POA: Diagnosis present

## 2017-06-21 DIAGNOSIS — G2581 Restless legs syndrome: Secondary | ICD-10-CM | POA: Diagnosis present

## 2017-06-21 DIAGNOSIS — Z419 Encounter for procedure for purposes other than remedying health state, unspecified: Secondary | ICD-10-CM

## 2017-06-21 DIAGNOSIS — G4700 Insomnia, unspecified: Secondary | ICD-10-CM | POA: Diagnosis present

## 2017-06-21 DIAGNOSIS — S129XXA Fracture of neck, unspecified, initial encounter: Secondary | ICD-10-CM | POA: Diagnosis present

## 2017-06-21 HISTORY — PX: ANTERIOR CERVICAL DECOMP/DISCECTOMY FUSION: SHX1161

## 2017-06-21 HISTORY — PX: POSTERIOR CERVICAL FUSION/FORAMINOTOMY: SHX5038

## 2017-06-21 SURGERY — POSTERIOR CERVICAL FUSION/FORAMINOTOMY LEVEL 3
Anesthesia: General | Site: Neck

## 2017-06-21 MED ORDER — BUPIVACAINE-EPINEPHRINE 0.5% -1:200000 IJ SOLN
INTRAMUSCULAR | Status: DC | PRN
  Administered 2017-06-21 (×2): 10 mL

## 2017-06-21 MED ORDER — DEXAMETHASONE SODIUM PHOSPHATE 4 MG/ML IJ SOLN
4.0000 mg | Freq: Four times a day (QID) | INTRAMUSCULAR | Status: AC
  Administered 2017-06-22 (×2): 4 mg via INTRAVENOUS
  Filled 2017-06-21 (×2): qty 1

## 2017-06-21 MED ORDER — LIDOCAINE 2% (20 MG/ML) 5 ML SYRINGE
INTRAMUSCULAR | Status: AC
  Filled 2017-06-21: qty 15

## 2017-06-21 MED ORDER — FENTANYL CITRATE (PF) 250 MCG/5ML IJ SOLN
INTRAMUSCULAR | Status: AC
Start: 2017-06-21 — End: ?
  Filled 2017-06-21: qty 5

## 2017-06-21 MED ORDER — HYDROMORPHONE HCL 1 MG/ML IJ SOLN
0.2500 mg | INTRAMUSCULAR | Status: DC | PRN
  Administered 2017-06-21 (×2): 0.5 mg via INTRAVENOUS

## 2017-06-21 MED ORDER — PRAVASTATIN SODIUM 40 MG PO TABS
40.0000 mg | ORAL_TABLET | Freq: Every day | ORAL | Status: DC
  Administered 2017-06-21: 40 mg via ORAL
  Filled 2017-06-21: qty 1

## 2017-06-21 MED ORDER — ONDANSETRON HCL 4 MG PO TABS: 4.0000 mg | ORAL_TABLET | Freq: Four times a day (QID) | ORAL | Status: DC | PRN

## 2017-06-21 MED ORDER — PHENYLEPHRINE HCL 10 MG/ML IJ SOLN
INTRAVENOUS | Status: DC | PRN
  Administered 2017-06-21: 15:00:00 via INTRAVENOUS
  Administered 2017-06-21: 50 ug/min via INTRAVENOUS

## 2017-06-21 MED ORDER — LACTATED RINGERS IV SOLN
INTRAVENOUS | Status: DC
Start: 2017-06-21 — End: 2017-06-22
  Administered 2017-06-21 (×2): via INTRAVENOUS

## 2017-06-21 MED ORDER — DEXAMETHASONE SODIUM PHOSPHATE 10 MG/ML IJ SOLN
INTRAMUSCULAR | Status: DC | PRN
  Administered 2017-06-21: 10 mg via INTRAVENOUS

## 2017-06-21 MED ORDER — BUPIVACAINE-EPINEPHRINE (PF) 0.5% -1:200000 IJ SOLN
INTRAMUSCULAR | Status: AC
  Filled 2017-06-21: qty 30

## 2017-06-21 MED ORDER — EPHEDRINE SULFATE 50 MG/ML IJ SOLN
INTRAMUSCULAR | Status: DC | PRN
  Administered 2017-06-21: 15 mg via INTRAVENOUS

## 2017-06-21 MED ORDER — BUPIVACAINE LIPOSOME 1.3 % IJ SUSP
INTRAMUSCULAR | Status: DC | PRN
  Administered 2017-06-21: 20 mL

## 2017-06-21 MED ORDER — HYDROMORPHONE HCL 1 MG/ML IJ SOLN
0.5000 mg | INTRAMUSCULAR | Status: DC | PRN
  Administered 2017-06-21: 0.5 mg via INTRAVENOUS
  Filled 2017-06-21: qty 0.5

## 2017-06-21 MED ORDER — MENTHOL 3 MG MT LOZG: 1.0000 | LOZENGE | OROMUCOSAL | Status: DC | PRN

## 2017-06-21 MED ORDER — LEVOTHYROXINE SODIUM 100 MCG PO TABS
100.0000 ug | ORAL_TABLET | Freq: Every day | ORAL | Status: DC
  Administered 2017-06-22: 100 ug via ORAL
  Filled 2017-06-21: qty 1

## 2017-06-21 MED ORDER — DEXAMETHASONE SODIUM PHOSPHATE 10 MG/ML IJ SOLN
INTRAMUSCULAR | Status: AC
  Filled 2017-06-21: qty 2

## 2017-06-21 MED ORDER — MEPERIDINE HCL 25 MG/ML IJ SOLN: 6.2500 mg | INTRAMUSCULAR | Status: DC | PRN

## 2017-06-21 MED ORDER — ACETAMINOPHEN 325 MG PO TABS: 650.0000 mg | ORAL_TABLET | ORAL | Status: DC | PRN

## 2017-06-21 MED ORDER — BUPIVACAINE LIPOSOME 1.3 % IJ SUSP
20.0000 mL | INTRAMUSCULAR | Status: DC
  Filled 2017-06-21: qty 20

## 2017-06-21 MED ORDER — GABAPENTIN 300 MG PO CAPS
300.0000 mg | ORAL_CAPSULE | Freq: Two times a day (BID) | ORAL | Status: DC
  Administered 2017-06-21 – 2017-06-22 (×2): 300 mg via ORAL
  Filled 2017-06-21 (×2): qty 1

## 2017-06-21 MED ORDER — SUGAMMADEX SODIUM 200 MG/2ML IV SOLN
INTRAVENOUS | Status: AC
  Filled 2017-06-21: qty 4

## 2017-06-21 MED ORDER — BACITRACIN ZINC 500 UNIT/GM EX OINT
TOPICAL_OINTMENT | CUTANEOUS | Status: AC
  Filled 2017-06-21: qty 28.35

## 2017-06-21 MED ORDER — MORPHINE SULFATE (PF) 4 MG/ML IV SOLN: 4.0000 mg | INTRAVENOUS | Status: DC | PRN

## 2017-06-21 MED ORDER — ONDANSETRON HCL 4 MG/2ML IJ SOLN
INTRAMUSCULAR | Status: AC
  Filled 2017-06-21: qty 6

## 2017-06-21 MED ORDER — ONDANSETRON HCL 4 MG/2ML IJ SOLN
INTRAMUSCULAR | Status: DC | PRN
  Administered 2017-06-21: 4 mg via INTRAVENOUS

## 2017-06-21 MED ORDER — 0.9 % SODIUM CHLORIDE (POUR BTL) OPTIME
TOPICAL | Status: DC | PRN
  Administered 2017-06-21: 1000 mL

## 2017-06-21 MED ORDER — CHLORHEXIDINE GLUCONATE CLOTH 2 % EX PADS: 6.0000 | MEDICATED_PAD | Freq: Once | CUTANEOUS | Status: DC

## 2017-06-21 MED ORDER — ONDANSETRON HCL 4 MG/2ML IJ SOLN: 4.0000 mg | Freq: Four times a day (QID) | INTRAMUSCULAR | Status: DC | PRN

## 2017-06-21 MED ORDER — THROMBIN 20000 UNITS EX SOLR
CUTANEOUS | Status: AC
  Filled 2017-06-21: qty 20000

## 2017-06-21 MED ORDER — BACITRACIN ZINC 500 UNIT/GM EX OINT
TOPICAL_OINTMENT | CUTANEOUS | Status: DC | PRN
  Administered 2017-06-21: 1 via TOPICAL

## 2017-06-21 MED ORDER — ACETAMINOPHEN 650 MG RE SUPP: 650.0000 mg | RECTAL | Status: DC | PRN

## 2017-06-21 MED ORDER — HYDROMORPHONE HCL 1 MG/ML IJ SOLN
INTRAMUSCULAR | Status: AC
  Administered 2017-06-21: 0.5 mg via INTRAVENOUS
  Filled 2017-06-21: qty 1

## 2017-06-21 MED ORDER — BACITRACIN 50000 UNITS IM SOLR
INTRAMUSCULAR | Status: DC | PRN
  Administered 2017-06-21: 14:00:00

## 2017-06-21 MED ORDER — TRAMADOL HCL 50 MG PO TABS
50.0000 mg | ORAL_TABLET | Freq: Two times a day (BID) | ORAL | Status: DC
  Administered 2017-06-21 – 2017-06-22 (×2): 50 mg via ORAL
  Filled 2017-06-21 (×2): qty 1

## 2017-06-21 MED ORDER — HYDROCHLOROTHIAZIDE 25 MG PO TABS
25.0000 mg | ORAL_TABLET | Freq: Every day | ORAL | Status: DC
Start: 2017-06-22 — End: 2017-06-22
  Administered 2017-06-22: 25 mg via ORAL
  Filled 2017-06-21: qty 1

## 2017-06-21 MED ORDER — PHENYLEPHRINE HCL 10 MG/ML IJ SOLN
INTRAMUSCULAR | Status: DC | PRN
  Administered 2017-06-21: 160 ug via INTRAVENOUS
  Administered 2017-06-21 (×2): 80 ug via INTRAVENOUS

## 2017-06-21 MED ORDER — TRAZODONE HCL 150 MG PO TABS
150.0000 mg | ORAL_TABLET | Freq: Every day | ORAL | Status: DC
Start: 2017-06-21 — End: 2017-06-22
  Administered 2017-06-21: 150 mg via ORAL
  Filled 2017-06-21: qty 1

## 2017-06-21 MED ORDER — THROMBIN 20000 UNITS EX SOLR
CUTANEOUS | Status: DC | PRN
  Administered 2017-06-21: 14:00:00 via TOPICAL

## 2017-06-21 MED ORDER — LIDOCAINE HCL (CARDIAC) 20 MG/ML IV SOLN
INTRAVENOUS | Status: DC | PRN
  Administered 2017-06-21: 40 mg via INTRAVENOUS
  Administered 2017-06-21: 60 mg via INTRAVENOUS

## 2017-06-21 MED ORDER — PHENYLEPHRINE 40 MCG/ML (10ML) SYRINGE FOR IV PUSH (FOR BLOOD PRESSURE SUPPORT)
PREFILLED_SYRINGE | INTRAVENOUS | Status: AC
  Filled 2017-06-21: qty 30

## 2017-06-21 MED ORDER — CEFAZOLIN SODIUM-DEXTROSE 2-4 GM/100ML-% IV SOLN
2.0000 g | Freq: Three times a day (TID) | INTRAVENOUS | Status: AC
  Administered 2017-06-21 – 2017-06-22 (×2): 2 g via INTRAVENOUS
  Filled 2017-06-21 (×2): qty 100

## 2017-06-21 MED ORDER — LACTATED RINGERS IV SOLN: INTRAVENOUS | Status: DC

## 2017-06-21 MED ORDER — PROPOFOL 10 MG/ML IV BOLUS
INTRAVENOUS | Status: DC | PRN
  Administered 2017-06-21: 140 mg via INTRAVENOUS
  Administered 2017-06-21: 50 mg via INTRAVENOUS

## 2017-06-21 MED ORDER — DOCUSATE SODIUM 100 MG PO CAPS
100.0000 mg | ORAL_CAPSULE | Freq: Two times a day (BID) | ORAL | Status: DC
  Administered 2017-06-21 – 2017-06-22 (×2): 100 mg via ORAL
  Filled 2017-06-21 (×2): qty 1

## 2017-06-21 MED ORDER — PHENOL 1.4 % MT LIQD: 1.0000 | OROMUCOSAL | Status: DC | PRN

## 2017-06-21 MED ORDER — ONDANSETRON HCL 4 MG/2ML IJ SOLN: 4.0000 mg | Freq: Once | INTRAMUSCULAR | Status: DC | PRN

## 2017-06-21 MED ORDER — ROCURONIUM BROMIDE 10 MG/ML (PF) SYRINGE
PREFILLED_SYRINGE | INTRAVENOUS | Status: AC
  Filled 2017-06-21: qty 15

## 2017-06-21 MED ORDER — OXYCODONE-ACETAMINOPHEN 5-325 MG PO TABS
1.0000 | ORAL_TABLET | ORAL | Status: DC | PRN
  Administered 2017-06-21 – 2017-06-22 (×4): 2 via ORAL
  Filled 2017-06-21 (×3): qty 2

## 2017-06-21 MED ORDER — EPHEDRINE 5 MG/ML INJ
INTRAVENOUS | Status: AC
  Filled 2017-06-21: qty 20

## 2017-06-21 MED ORDER — ZOLPIDEM TARTRATE 5 MG PO TABS: 5.0000 mg | ORAL_TABLET | Freq: Every evening | ORAL | Status: DC | PRN

## 2017-06-21 MED ORDER — BISACODYL 10 MG RE SUPP: 10.0000 mg | Freq: Every day | RECTAL | Status: DC | PRN

## 2017-06-21 MED ORDER — PHENYLEPHRINE HCL 10 MG/ML IJ SOLN
INTRAMUSCULAR | Status: AC
  Filled 2017-06-21: qty 1

## 2017-06-21 MED ORDER — OXYCODONE-ACETAMINOPHEN 5-325 MG PO TABS
ORAL_TABLET | ORAL | Status: AC
  Filled 2017-06-21: qty 2

## 2017-06-21 MED ORDER — FENTANYL CITRATE (PF) 100 MCG/2ML IJ SOLN
INTRAMUSCULAR | Status: DC | PRN
  Administered 2017-06-21 (×3): 50 ug via INTRAVENOUS
  Administered 2017-06-21: 100 ug via INTRAVENOUS

## 2017-06-21 MED ORDER — SUGAMMADEX SODIUM 200 MG/2ML IV SOLN
INTRAVENOUS | Status: DC | PRN
  Administered 2017-06-21: 120 mg via INTRAVENOUS

## 2017-06-21 MED ORDER — GELATIN ABSORBABLE MT POWD
OROMUCOSAL | Status: DC | PRN
  Administered 2017-06-21: 14:00:00 via TOPICAL

## 2017-06-21 MED ORDER — PREGABALIN 50 MG PO CAPS
50.0000 mg | ORAL_CAPSULE | Freq: Two times a day (BID) | ORAL | Status: DC
  Administered 2017-06-21 – 2017-06-22 (×2): 50 mg via ORAL
  Filled 2017-06-21 (×2): qty 1

## 2017-06-21 MED ORDER — ROCURONIUM BROMIDE 100 MG/10ML IV SOLN
INTRAVENOUS | Status: DC | PRN
  Administered 2017-06-21: 20 mg via INTRAVENOUS
  Administered 2017-06-21: 60 mg via INTRAVENOUS
  Administered 2017-06-21: 20 mg via INTRAVENOUS

## 2017-06-21 MED ORDER — ALUM & MAG HYDROXIDE-SIMETH 200-200-20 MG/5ML PO SUSP: 30.0000 mL | Freq: Four times a day (QID) | ORAL | Status: DC | PRN

## 2017-06-21 MED ORDER — DEXAMETHASONE 4 MG PO TABS
4.0000 mg | ORAL_TABLET | Freq: Four times a day (QID) | ORAL | Status: AC
  Administered 2017-06-22: 4 mg via ORAL
  Filled 2017-06-21: qty 1

## 2017-06-21 MED ORDER — DULOXETINE HCL 30 MG PO CPEP
30.0000 mg | ORAL_CAPSULE | Freq: Two times a day (BID) | ORAL | Status: DC
  Administered 2017-06-21 – 2017-06-22 (×2): 30 mg via ORAL
  Filled 2017-06-21 (×2): qty 1

## 2017-06-21 MED ORDER — DILTIAZEM HCL 30 MG PO TABS
30.0000 mg | ORAL_TABLET | Freq: Every day | ORAL | Status: DC
  Filled 2017-06-21 (×3): qty 1

## 2017-06-21 MED ORDER — THROMBIN 5000 UNITS EX SOLR
CUTANEOUS | Status: AC
  Filled 2017-06-21: qty 10000

## 2017-06-21 SURGICAL SUPPLY — 98 items
APL SKNCLS STERI-STRIP NONHPOA (GAUZE/BANDAGES/DRESSINGS) ×2
BAG DECANTER FOR FLEXI CONT (MISCELLANEOUS) ×4 IMPLANT
BENZOIN TINCTURE PRP APPL 2/3 (GAUZE/BANDAGES/DRESSINGS) ×7 IMPLANT
BIT DRILL NEURO 2X3.1 SFT TUCH (MISCELLANEOUS) ×2 IMPLANT
BLADE CLIPPER SURG (BLADE) ×3 IMPLANT
BLADE SURG 11 STRL SS (BLADE) IMPLANT
BLADE SURG 15 STRL LF DISP TIS (BLADE) ×1 IMPLANT
BLADE SURG 15 STRL SS (BLADE)
BLADE ULTRA TIP 2M (BLADE) ×1 IMPLANT
BUR BARREL STRAIGHT FLUTE 4.0 (BURR) ×1 IMPLANT
BUR MATCHSTICK NEURO 3.0 LAGG (BURR) ×1 IMPLANT
BUR MATCHSTICK NEURO 3.0X3.8 (BURR) ×2 IMPLANT
CANISTER SUCT 3000ML PPV (MISCELLANEOUS) ×6 IMPLANT
CAP CLSR POST CERV (Cap) ×16 IMPLANT
CARTRIDGE OIL MAESTRO DRILL (MISCELLANEOUS) ×2 IMPLANT
CLOSURE WOUND 1/2 X4 (GAUZE/BANDAGES/DRESSINGS) ×2
COUNTER NEEDLE 20 DBL MAG RED (NEEDLE) ×2 IMPLANT
COVER MAYO STAND STRL (DRAPES) ×3 IMPLANT
DECANTER SPIKE VIAL GLASS SM (MISCELLANEOUS) ×1 IMPLANT
DIFFUSER DRILL AIR PNEUMATIC (MISCELLANEOUS) ×6 IMPLANT
DRAIN JACKSON PRATT 10MM FLAT (MISCELLANEOUS) ×2 IMPLANT
DRAPE C-ARM 42X72 X-RAY (DRAPES) ×10 IMPLANT
DRAPE LAPAROTOMY 100X72 PEDS (DRAPES) ×6 IMPLANT
DRAPE MICROSCOPE LEICA (MISCELLANEOUS) IMPLANT
DRAPE POUCH INSTRU U-SHP 10X18 (DRAPES) ×6 IMPLANT
DRAPE SURG 17X23 STRL (DRAPES) ×16 IMPLANT
DRILL NEURO 2X3.1 SOFT TOUCH (MISCELLANEOUS) ×3
ELECT BLADE 4.0 EZ CLEAN MEGAD (MISCELLANEOUS) ×6
ELECT CAUTERY BLADE 6.4 (BLADE) ×4 IMPLANT
ELECT REM PT RETURN 9FT ADLT (ELECTROSURGICAL) ×6
ELECTRODE BLDE 4.0 EZ CLN MEGD (MISCELLANEOUS) ×1 IMPLANT
ELECTRODE REM PT RTRN 9FT ADLT (ELECTROSURGICAL) ×2 IMPLANT
EVACUATOR SILICONE 100CC (DRAIN) ×2 IMPLANT
GAUZE SPONGE 4X4 12PLY STRL (GAUZE/BANDAGES/DRESSINGS) ×7 IMPLANT
GAUZE SPONGE 4X4 16PLY XRAY LF (GAUZE/BANDAGES/DRESSINGS) ×4 IMPLANT
GLOVE BIO SURGEON STRL SZ8 (GLOVE) ×2 IMPLANT
GLOVE BIO SURGEON STRL SZ8.5 (GLOVE) ×2 IMPLANT
GLOVE BIOGEL PI IND STRL 6.5 (GLOVE) IMPLANT
GLOVE BIOGEL PI IND STRL 7.0 (GLOVE) IMPLANT
GLOVE BIOGEL PI IND STRL 7.5 (GLOVE) IMPLANT
GLOVE BIOGEL PI INDICATOR 6.5 (GLOVE) ×2
GLOVE BIOGEL PI INDICATOR 7.0 (GLOVE) ×2
GLOVE BIOGEL PI INDICATOR 7.5 (GLOVE) ×8
GLOVE EXAM NITRILE LRG STRL (GLOVE) IMPLANT
GLOVE EXAM NITRILE XL STR (GLOVE) IMPLANT
GLOVE EXAM NITRILE XS STR PU (GLOVE) IMPLANT
GLOVE SURG SS PI 6.5 STRL IVOR (GLOVE) ×8 IMPLANT
GLOVE SURG SS PI 7.0 STRL IVOR (GLOVE) ×10 IMPLANT
GLOVE SURG SS PI 7.5 STRL IVOR (GLOVE) ×2 IMPLANT
GLOVE SURG SS PI 8.0 STRL IVOR (GLOVE) ×6 IMPLANT
GLOVE SURG SS PI 8.5 STRL IVOR (GLOVE) ×6
GLOVE SURG SS PI 8.5 STRL STRW (GLOVE) IMPLANT
GOWN STRL REUS W/ TWL LRG LVL3 (GOWN DISPOSABLE) IMPLANT
GOWN STRL REUS W/ TWL XL LVL3 (GOWN DISPOSABLE) ×1 IMPLANT
GOWN STRL REUS W/TWL 2XL LVL3 (GOWN DISPOSABLE) IMPLANT
GOWN STRL REUS W/TWL LRG LVL3 (GOWN DISPOSABLE) ×9
GOWN STRL REUS W/TWL XL LVL3 (GOWN DISPOSABLE) ×15
HEMOSTAT POWDER KIT SURGIFOAM (HEMOSTASIS) ×3 IMPLANT
KIT BASIN OR (CUSTOM PROCEDURE TRAY) ×4 IMPLANT
KIT INFUSE XX SMALL 0.7CC (Orthopedic Implant) ×2 IMPLANT
KIT ROOM TURNOVER OR (KITS) ×4 IMPLANT
MARKER SKIN DUAL TIP RULER LAB (MISCELLANEOUS) ×3 IMPLANT
NDL SPNL 18GX3.5 QUINCKE PK (NEEDLE) ×1 IMPLANT
NEEDLE HYPO 22GX1.5 SAFETY (NEEDLE) ×4 IMPLANT
NEEDLE SPNL 18GX3.5 QUINCKE PK (NEEDLE) ×3 IMPLANT
NS IRRIG 1000ML POUR BTL (IV SOLUTION) ×4 IMPLANT
OIL CARTRIDGE MAESTRO DRILL (MISCELLANEOUS) ×6
PACK LAMINECTOMY NEURO (CUSTOM PROCEDURE TRAY) ×6 IMPLANT
PAD ARMBOARD 7.5X6 YLW CONV (MISCELLANEOUS) ×9 IMPLANT
PATTIES SURGICAL .25X.25 (GAUZE/BANDAGES/DRESSINGS) IMPLANT
PIN DISTRACTION 14MM (PIN) ×2 IMPLANT
PIN MAYFIELD SKULL DISP (PIN) ×3 IMPLANT
PLATE ANT CERV XTEND 3 LV 42 (Plate) ×2 IMPLANT
ROD CRVD TI VIR BLUE 45X3.5MM (Rod) ×4 IMPLANT
RUBBERBAND STERILE (MISCELLANEOUS) IMPLANT
SCREW VIRAGE 3.5X14 (Screw) ×16 IMPLANT
SCREW XTEND SELFTAP VAR 4.6X14 (Screw) ×16 IMPLANT
SPONGE INTESTINAL PEANUT (DISPOSABLE) ×14 IMPLANT
SPONGE LAP 4X18 X RAY DECT (DISPOSABLE) IMPLANT
SPONGE NEURO XRAY DETECT 1X3 (DISPOSABLE) IMPLANT
SPONGE SURGIFOAM ABS GEL 100 (HEMOSTASIS) ×2 IMPLANT
SPONGE SURGIFOAM ABS GEL SZ50 (HEMOSTASIS) ×2 IMPLANT
STAPLER SKIN PROX WIDE 3.9 (STAPLE) IMPLANT
STRIP BIOACTIVE 10CC 25X100X4 (Miscellaneous) ×2 IMPLANT
STRIP CLOSURE SKIN 1/2X4 (GAUZE/BANDAGES/DRESSINGS) ×4 IMPLANT
SUT ETHILON 2 0 FS 18 (SUTURE) IMPLANT
SUT VIC AB 0 CT1 18XCR BRD8 (SUTURE) ×1 IMPLANT
SUT VIC AB 0 CT1 27 (SUTURE) ×3
SUT VIC AB 0 CT1 27XBRD ANTBC (SUTURE) ×1 IMPLANT
SUT VIC AB 0 CT1 8-18 (SUTURE) ×3
SUT VIC AB 2-0 CP2 18 (SUTURE) ×3 IMPLANT
SUT VIC AB 3-0 SH 8-18 (SUTURE) ×3 IMPLANT
TAPE CLOTH SURG 4X10 WHT LF (GAUZE/BANDAGES/DRESSINGS) ×2 IMPLANT
TOWEL GREEN STERILE (TOWEL DISPOSABLE) ×6 IMPLANT
TOWEL GREEN STERILE FF (TOWEL DISPOSABLE) ×4 IMPLANT
TRAY FOLEY BAG SILVER LF 16FR (CATHETERS) ×2 IMPLANT
TRAY FOLEY W/METER SILVER 16FR (SET/KITS/TRAYS/PACK) IMPLANT
WATER STERILE IRR 1000ML POUR (IV SOLUTION) ×4 IMPLANT

## 2017-06-21 NOTE — Op Note (Signed)
Brief history: The patient is a 71 year old white female on whom I performed a C4-5, C5-6 and C6-7 anterior cervical discectomy, fusion, and plating about 6 weeks ago. The patient initially did well. On the postoperative follow-up visit a postoperative cervical x-ray demonstrated the patient's anterior cervical plate had become displaced anteriorly. I discussed the situation with the patient and it recommended she consider a posterior cervical instrumentation and fusion with revision of her anterior hardware. The patient has weighed the risks, benefits, and alternatives to surgery and decided to proceed with the operation.  Preoperative diagnosis: Cervical pseudoarthrosis, cervicalgia  Postop diagnosis: Same  Procedure: Posterior C4-5, C5-6 and C6-7 arthrodesis with local morselized autograft bone, Bone graft extender, and bone morphogenic protein-soaked collagen sponges; posterior cervical instrumentation C4-C7 with Zimmer titanium lateral mass screws and rods; anterior cervical education C4-C7 with globus titanium plate and screws  Surgeon: Dr. Earle Gell  Assistant: Dr. Kathyrn Sheriff  Anesthesia: Gen. tracheal  Estimated blood loss: 200 mL  Specimens: None  Drains: One 10 mm flat Jackson-Pratt drain in the prevertebral space  Complications: None  Description of procedure: The patient was brought to the operating room by the anesthesia team. General endotracheal anesthesia was induced. I then applied the [place] 3 point headrest to the patient's calvarium. She was turned to the prone position on the chest rolls. The patient's suboccipital region was shaved with the clippers and this region as well as the posterior cervical and thoracic region was then prepared with Betadine scrub and Betadine solution. Sterile drapes were applied. I then injected the area to be incised with Marcaine with epinephrine solution. I used a scalpel to make a midline incision over the C4-5, C5-6 and C6-7 interspaces.  I used the cautery to perform a bilateral subperiosteal dissection exposing the spinous process and lamina of C4, C5, C6 and C7. We inserted the [facility] retractor for exposure. We confirmed our location with intraoperative fluoroscopy which was of limited during the placement of the screws because of  the patient's shoulders obscure the view.  I used the cautery to expose the lateral masses at C4, C5, C6 and C7 bilaterally. We identified the center of the lateral mass. I used a drill to drill a 14 member hold in the center of the lateral masses at C4, C5, C6 and C7 drilling in the cephalad and lateral trajectory. I then removed the drill and we probed inside the drill holes for cortical breaches with a ball probe. There were none. I then inserted a 14 mm titanium polyaxial screws into the lateral masses at C4, C5, C6 and C7 bilaterally. I connected the unilateral lateral mass screws with a lordotic rod. We secured the rod in place with the Spirit this completed in a dictation from C4-C7 bilaterally.  We now turned attention to the posterior lateral arthrodesis. I used a high-speed drill to decorticate the lateral lamina facet and lateral masses at C4-5, C5-6 and C6-7 bilaterally. We laid a combination of bone morphogenic protein-soaked collagen sponges, local autograft bone we obtained during the drilling, and Bone graft extender over the corticated posterior lateral structures at C45, C5-6 and C6-7, completing the arthrodesis at these levels.  We then obtained hemostasis using bipolar cautery. We removed the retractor. I then reapproximated patient's cervical thoracic fascia with interrupted 0 Vicryl suture. I reapproximated the subcutaneous tissue with interrupted 2-0 Vicryl suture. I reapproximated the skin with Steri-Strips and benzoin. The wound was then coated with bacitracin ointment. A sterile dressing was applied. The drapes were  removed. The patient was returned to the supine position. I then  removed the Mayfield 3 point head rest for the patient's calvarium. By report, all sponge, asthma, and needle counts were correct at the end of this part of the operation.  We now turned attention to the second part of the operation. The patient's anterior neck was then prepared with Betadine scrub and Betadine solution. Sterile drapes were applied. I then injected the area to be incised with Marcaine with epinephrine solution. I should scalpel to make an incision through the patient's previous surgical scar on the left. I used the Metzenbaum scissor to dissected through the platysma muscle. We then dissected medial to substernal "MS or muscle drug [place] carotid artery. We carefully dissected through the scar tissue. I identified the esophagus. We retracted it medially with the hand-held retractors. I then used a Kitner swabs to clear soft tissue from the anterior plate. I then unlock the cams, remove the screws and then removed the plate. I then placed a Caspar retractor underneath the longus colli muscles bilaterally to provide exposure. I then used electrocautery to clear soft tissue from the anterior cervical spine so that the plate would lay down flat. I inspected the old plate at the cams appeared to be somewhat stripped so I used a new plate and laid along the antral aspect of the vertebral bodies from C4-C7. I secured the plate to the vertebral bodies by using 14 mm rescue screws at C4, C5, C6 and C7 bilaterally. We got fairly good bony purchase. We used intraoperative fluoroscopy to check on the position of the instrumentation. The plate appear well seated. I locked the cams securing the screws to the plate.  We then obtained hemostasis using bipolar cautery. I placed a 10 mm flat Jackson-Pratt drain in the prevertebral space and tunneled it out through a separate stab wound. We then removed the retractor and reapproximated the patient's platysma with interrupted 3-0 Vicryl suture. Reapproximated the  subcutaneous tissue with interrupted 3-0 Vicryl suture. I reapproximated the skin with Steri-Strips and benzoin. The wound was then coated with bacitracin ointment. A sterile dressing was applied. The drapes were removed. By report all sponge, insulin, and needle counts were correct at the end this case.

## 2017-06-21 NOTE — Transfer of Care (Signed)
Immediate Anesthesia Transfer of Care Note  Patient: Emily Leonard  Procedure(s) Performed: Procedure(s): POSTERIOR CERVICAL FUSION WITH LATERAL MASS FIXATION CERVICAL FOUR- CERVICAL FIVE, CERVICAL FIVE- CERVICAL SIX, CERVICAL SIX- CERVICAL SEVEN (N/A) Revision Anterior Instrumentation Cervical four-seven (N/A)  Patient Location: PACU  Anesthesia Type:General  Level of Consciousness: awake, alert  and patient cooperative  Airway & Oxygen Therapy: Patient Spontanous Breathing  Post-op Assessment: Report given to RN and Post -op Vital signs reviewed and stable  Post vital signs: Reviewed and stable  Last Vitals:  Vitals:   06/21/17 1108  BP: 139/77  Pulse: 63  Resp: 18  Temp: 36.7 C  SpO2: 96%    Last Pain:  Vitals:   06/21/17 1108  TempSrc: Oral  PainSc: 6          Complications: No apparent anesthesia complications

## 2017-06-21 NOTE — Anesthesia Procedure Notes (Addendum)
Procedure Name: Intubation Date/Time: 06/21/2017 12:42 PM Performed by: Salli Quarry Cyrilla Durkin Pre-anesthesia Checklist: Patient identified, Emergency Drugs available, Suction available and Patient being monitored Patient Re-evaluated:Patient Re-evaluated prior to induction Oxygen Delivery Method: Circle System Utilized Preoxygenation: Pre-oxygenation with 100% oxygen Induction Type: IV induction Ventilation: Mask ventilation without difficulty Laryngoscope Size: Mac and 4 (ok by surgeon to remove neck immoblizer and DL) Grade View: Grade I Tube type: Oral Tube size: 7.0 mm Number of attempts: 1 Airway Equipment and Method: Stylet Placement Confirmation: ETT inserted through vocal cords under direct vision,  positive ETCO2 and breath sounds checked- equal and bilateral Secured at: 22 cm Tube secured with: Tape Dental Injury: Teeth and Oropharynx as per pre-operative assessment

## 2017-06-21 NOTE — Progress Notes (Signed)
Patient ID: Emily Leonard, female   DOB: 20-Aug-1946, 71 y.o.   MRN: [id number] Subjective: The patient is Alert and pleasant. She is in no apparent distress. She looks well. She has some diffuse ecchymosis but this is a chronic issue for her.  Objective: Vital signs in last 24 hours: Temp:  [98 F (36.7 C)] 98 F (36.7 C) (09/06 1108) Pulse Rate:  [63] 63 (09/06 1108) Resp:  [18] 18 (09/06 1108) BP: (139)/(77) 139/77 (09/06 1108) SpO2:  [96 %] 96 % (09/06 1108) Weight:  [63.1 kg (139 lb 3.2 oz)] 63.1 kg (139 lb 3.2 oz) (09/06 1108)  Intake/Output from previous day: No intake/output data recorded. Intake/Output this shift: Total I/O In: 1700 [I.V.:1700] Out: 390 [Urine:190; Blood:200]  Physical exam the patient is alert and pleasant. She is moving all 4 extremities well. Her dressings are clean and dry. There is no evidence of hematoma or shift.  Lab Results:  Recent Labs  06/19/17 1118  WBC 6.4  HGB 14.8  HCT 46.0  PLT 309   BMET  Recent Labs  06/19/17 1118  NA 138  K 4.0  CL 104  CO2 26  GLUCOSE 100*  BUN 18  CREATININE 0.97  CALCIUM 9.8    Studies/Results: Dg Cervical Spine 1 View  Result Date: 06/21/2017 CLINICAL DATA:  C4-C7 posterior fusion.  Revision C4-C7 ACDF. EXAM: CERVICAL SPINE 1 VIEW COMPARISON:  June 08, 2017. FLUOROSCOPY TIME:  27 seconds. C-arm fluoroscopic images were obtained intraoperatively and submitted for post operative interpretation. FINDINGS: Lateral intraoperative x-rays demonstrate interval posterior fusion from C4-C7. C4-C7 ACDF hardware is again noted. Alignment is normal. IMPRESSION: Interval C4-C7 posterior fusion.  Prior C4-C7 ACDF. Electronically Signed   By: Titus Dubin M.D.   On: 06/21/2017 17:09   Dg C-arm Gt 120 Min  Result Date: 06/21/2017 CLINICAL DATA:  C4-C7 posterior fusion.  Revision C4-C7 ACDF. EXAM: CERVICAL SPINE 1 VIEW COMPARISON:  June 08, 2017. FLUOROSCOPY TIME:  27 seconds. C-arm fluoroscopic images were  obtained intraoperatively and submitted for post operative interpretation. FINDINGS: Lateral intraoperative x-rays demonstrate interval posterior fusion from C4-C7. C4-C7 ACDF hardware is again noted. Alignment is normal. IMPRESSION: Interval C4-C7 posterior fusion.  Prior C4-C7 ACDF. Electronically Signed   By: Titus Dubin M.D.   On: 06/21/2017 17:09    Assessment/Plan: The patient is doing well. I spoke with her family.  LOS: 0 days     Keleigh Kazee D 06/21/2017, 5:34 PM

## 2017-06-21 NOTE — H&P (Signed)
Subjective: The patient is a 71 year old white female on whom I performed a C4-5, C5-6 and C6-7 anterior cervical discectomy, fusion, and plating on 05/07/2017. Her postoperative x-rays looked good but at a follow-up office visit x-ray demonstrated the plate had become anterior displaced from her spine. I discussed the situation with the patient and recommend she undergo a posterior cervical instrumentation and fusion with revision of her anterior plate. The patient has decided to proceed with surgery.   Past Medical History:  Diagnosis Date  . Anxiety   . Chronic pain syndrome   . COPD (chronic obstructive pulmonary disease) ([place])   . Degenerative disc disease   . Degenerative joint disease   . Depression   . Essential hypertension, benign   . GERD (gastroesophageal reflux disease)   . Hyperlipidemia   . Hypothyroidism   . Insomnia   . Osteoarthritis   . Palpitations   . Restless leg syndrome   . Thyroid disease   . TIA (transient ischemic attack)     Past Surgical History:  Procedure Laterality Date  . ABDOMINAL HYSTERECTOMY    . ANTERIOR CERVICAL DECOMP/DISCECTOMY FUSION N/A 05/07/2017   Procedure: ANTERIOR CERVICAL DECOMPRESSION/DISCECTOMY FUSION CERVICAL FOUR- CERVICAL FIVE, CERVICAL FIVE- CERVICAL SIX, CERVICAL SIX- CERVICAL SEVEN;  Surgeon: Newman Pies, MD;  Location: [facility];  Service: Neurosurgery;  Laterality: N/A;  . APPENDECTOMY    . CATARACT EXTRACTION, BILATERAL    . CERVICAL SPINE SURGERY    . COLONOSCOPY  02/2013  . HEMORRHOID SURGERY  03/2013  . LUMBAR SPINE SURGERY     x 3    Allergies  Allergen Reactions  . Erythromycin Swelling  . Latex Other (See Comments)    Blisters  . Morphine Sulfate Other (See Comments)    Hallucinations/deep sleep  . Clozapine Rash  . Fentanyl Nausea Only    "Feels like you are a hundred miles away when talking to her"    Social History  Substance Use Topics  . Smoking status: Current Every Day Smoker    Packs/day: 0.75   Years: 30.00    Types: Cigarettes  . Smokeless tobacco: Never Used  . Alcohol use 0.6 oz/week    1 Standard drinks or equivalent per week     Comment: rarely    Family History  Problem Relation Age of Onset  . Hypertension Mother   . Hypertension Father   . Heart attack Father 42  . Heart attack Brother 73   Prior to Admission medications   Medication Sig Start Date End Date Taking? Authorizing Provider  alendronate (FOSAMAX) 70 MG tablet Take 70 mg by mouth once a week. Saturday 05/05/11  Yes [provider]  aspirin EC 81 MG tablet Take 81 mg by mouth daily.   Yes [provider]  Aspirin-Caffeine (BC FAST PAIN RELIEF ARTHRITIS) 1000-65 MG PACK Take 1 packet by mouth daily as needed (pain).    Yes [provider]  calcium carbonate (OS-CAL) 600 MG TABS Take 600 mg by mouth daily.    Yes [provider]  cyanocobalamin 500 MCG tablet Take 500 mcg by mouth daily.    Yes [provider]  diltiazem (CARDIZEM) 30 MG tablet TAKE ONE TABLET BY MOUTH AT BEDTIME 02/08/17  Yes Crenshaw, Denice Bors, MD  DULoxetine (CYMBALTA) 30 MG capsule Take 1 capsule (30 mg total) by mouth daily. Patient taking differently: Take 30 mg by mouth 2 (two) times daily.  09/04/14  Yes Kirsteins, Luanna Salk, MD  gabapentin (NEURONTIN) 300 MG  capsule Take 300 mg by mouth 2 (two) times daily.    Yes [provider]  hydrochlorothiazide 25 MG tablet Take 25 mg by mouth daily.    Yes [provider]  levothyroxine (SYNTHROID, LEVOTHROID) 100 MCG tablet Take 100 mcg by mouth daily before breakfast. In the morning. 11/12/16  Yes [provider]  Omega-3 Fatty Acids (FISH OIL) 1000 MG CAPS Take 1,000 mg by mouth daily.    Yes [provider]  oxyCODONE-acetaminophen (PERCOCET/ROXICET) 5-325 MG tablet Take 1 tablet by mouth 2 (two) times daily as needed for pain. 05/08/17  Yes [provider]  pravastatin (PRAVACHOL) 40 MG tablet Take 40 mg by  mouth at bedtime.  01/20/15  Yes [provider]  pregabalin (LYRICA) 50 MG capsule Take 1 capsule (50 mg total) by mouth 3 (three) times daily. Patient taking differently: Take 50 mg by mouth 2 (two) times daily.  02/02/17  Yes Kirsteins, Luanna Salk, MD  traMADol (ULTRAM) 50 MG tablet Take 1 tablet (50 mg total) by mouth 2 (two) times daily. 05/04/17  Yes Kirsteins, Luanna Salk, MD  traZODone (DESYREL) 150 MG tablet Take 150 mg by mouth at bedtime.  11/09/15  Yes [provider]  budesonide-formoterol (SYMBICORT) 160-4.5 MCG/ACT inhaler Inhale 2 puffs into the lungs daily as needed. Patient not taking: Reported on 06/13/2017 06/08/15   Minus Breeding, MD  cyclobenzaprine (FLEXERIL) 10 MG tablet Take 1 tablet (10 mg total) by mouth 3 (three) times daily as needed for muscle spasms. Patient not taking: Reported on 06/13/2017 05/08/17   Newman Pies, MD  docusate sodium (COLACE) 100 MG capsule Take 1 capsule (100 mg total) by mouth 2 (two) times daily. Patient not taking: Reported on 06/13/2017 05/08/17   Newman Pies, MD  GAVILYTE-N WITH FLAVOR PACK 420 G solution Take 4,000 mLs by mouth once.  02/10/13   [provider]  oxyCODONE (OXY IR/ROXICODONE) 5 MG immediate release tablet Take 1-2 tablets (5-10 mg total) by mouth every 3 (three) hours as needed for breakthrough pain. Patient not taking: Reported on 06/13/2017 05/08/17   Newman Pies, MD     Review of Systems  Positive ROS: As above  All other systems have been reviewed and were otherwise negative with the exception of those mentioned in the HPI and as above.  Objective: Vital signs in last 24 hours: Temp:  [98 F (36.7 C)] 98 F (36.7 C) (09/06 1108) Pulse Rate:  [63] 63 (09/06 1108) Resp:  [18] 18 (09/06 1108) BP: (139)/(77) 139/77 (09/06 1108) SpO2:  [96 %] 96 % (09/06 1108) Weight:  [63.1 kg (139 lb 3.2 oz)] 63.1 kg (139 lb 3.2 oz) (09/06 1108)  General Appearance: Alert Head: Normocephalic, without  obvious abnormality, atraumatic Eyes: PERRL, conjunctiva/corneas clear, EOM's intact,    Ears: Normal  Throat: Normal  Neck: Supple, she has limited cervical range of motion. Her anterior cervical incision is well-healed. Back: unremarkable, her lumbar incision is well-healed. Lungs: Clear to auscultation bilaterally, respirations unlabored Heart: Regular rate and rhythm, no murmur, rub or gallop Abdomen: Soft, non-tender Extremities: Extremities normal, atraumatic, no cyanosis or edema Skin: unremarkable  NEUROLOGIC:   Mental status: alert and oriented,Motor Exam - grossly normal Sensory Exam - grossly normal Reflexes:  Coordination - grossly normal Gait - grossly normal Balance - grossly normal Cranial Nerves: I: smell Not tested  II: visual acuity  OS: Normal  OD: Normal   II: visual fields Full to confrontation  II: pupils Equal, round, reactive to  light  III,VII: ptosis None  III,IV,VI: extraocular muscles  Full ROM  V: mastication Normal  V: facial light touch sensation  Normal  V,VII: corneal reflex  Present  VII: facial muscle function - upper  Normal  VII: facial muscle function - lower Normal  VIII: hearing Not tested  IX: soft palate elevation  Normal  IX,X: gag reflex Present  XI: trapezius strength  5/5  XI: sternocleidomastoid strength 5/5  XI: neck flexion strength  5/5  XII: tongue strength  Normal    Data Review Lab Results  Component Value Date   WBC 6.4 06/19/2017   HGB 14.8 06/19/2017   HCT 46.0 06/19/2017   MCV 90.2 06/19/2017   PLT 309 06/19/2017   Lab Results  Component Value Date   NA 138 06/19/2017   K 4.0 06/19/2017   CL 104 06/19/2017   CO2 26 06/19/2017   BUN 18 06/19/2017   CREATININE 0.97 06/19/2017   GLUCOSE 100 (H) 06/19/2017   Lab Results  Component Value Date   INR 1.0 03/23/2009    Assessment/Plan: Cervical pseudoarthrosis, cervicalgia: I have discussed the situation with the patient and reviewed her x-rays with her.  We have discussed the various treatment options including surgery. I have recommended a posterior cervical instrumentation and fusion from C4-C7 with revision of her anterior instrumentation. I have described the surgery to her. I have shown her surgical models. Have discussed the risks, benefits, alternatives, expected postoperative course, and likelihood of achieving her goals with surgery. I answered all the patient's, and her husbands, questions. She has decided to proceed with surgery.   Sonal Dorwart D 06/21/2017 12:00 PM

## 2017-06-21 NOTE — Anesthesia Preprocedure Evaluation (Signed)
Anesthesia Evaluation  Patient identified by MRN, date of birth, ID band Patient awake    Reviewed: Allergy & Precautions, NPO status , Patient's Chart, lab work & pertinent test results  Airway Mallampati: I  TM Distance: >3 FB Neck ROM: Full    Dental   Pulmonary COPD, Current Smoker,    Pulmonary exam normal        Cardiovascular hypertension, Pt. on medications Normal cardiovascular exam     Neuro/Psych Anxiety Depression TIA   GI/Hepatic GERD  Medicated and Controlled,  Endo/Other    Renal/GU      Musculoskeletal   Abdominal   Peds  Hematology   Anesthesia Other Findings   Reproductive/Obstetrics                             Anesthesia Physical Anesthesia Plan  ASA: II  Anesthesia Plan: General   Post-op Pain Management:    Induction:   PONV Risk Score and Plan: 2 and Ondansetron, Dexamethasone and Treatment may vary due to age or medical condition  Airway Management Planned: Oral ETT  Additional Equipment:   Intra-op Plan:   Post-operative Plan: Extubation in OR  Informed Consent: I have reviewed the patients History and Physical, chart, labs and discussed the procedure including the risks, benefits and alternatives for the proposed anesthesia with the patient or authorized representative who has indicated his/her understanding and acceptance.     Plan Discussed with: CRNA and Surgeon  Anesthesia Plan Comments:         Anesthesia Quick Evaluation

## 2017-06-21 NOTE — Anesthesia Postprocedure Evaluation (Signed)
Anesthesia Post Note  Patient: Emily Leonard  Procedure(s) Performed: Procedure(s) (LRB): POSTERIOR CERVICAL FUSION WITH LATERAL MASS FIXATION CERVICAL FOUR- CERVICAL FIVE, CERVICAL FIVE- CERVICAL SIX, CERVICAL SIX- CERVICAL SEVEN (N/A) Revision Anterior Instrumentation Cervical four-seven (N/A)     Patient location during evaluation: PACU Anesthesia Type: General Level of consciousness: awake and alert Pain management: pain level controlled Vital Signs Assessment: post-procedure vital signs reviewed and stable Respiratory status: spontaneous breathing, nonlabored ventilation, respiratory function stable and patient connected to nasal cannula oxygen Cardiovascular status: blood pressure returned to baseline and stable Postop Assessment: no signs of nausea or vomiting Anesthetic complications: no    Last Vitals:  Vitals:   06/21/17 1820 06/21/17 2000  BP: 105/66 114/70  Pulse: 74 68  Resp: 18 18  Temp: 36.8 C 36.9 C  SpO2: 95% 97%    Last Pain:  Vitals:   06/21/17 2000  TempSrc: Oral  PainSc:                  Karyl Kinnier Ellender

## 2017-06-22 ENCOUNTER — Encounter (HOSPITAL_COMMUNITY): Payer: Self-pay | Admitting: Neurosurgery

## 2017-06-22 MED ORDER — OXYCODONE-ACETAMINOPHEN 5-325 MG PO TABS: 1.0000 | ORAL_TABLET | ORAL | 0 refills | Status: DC | PRN

## 2017-06-22 NOTE — Progress Notes (Signed)
Patient is discharged from room 3C05 at this time. Alert and in stable condition. IV site d/c'd and instructions read to patient and family with understanding verbalized. Left unit via wheelchair with all belongings at side. 

## 2017-06-22 NOTE — Discharge Summary (Signed)
Physician Discharge Summary  Patient ID: ZARAI ORSBORN MRN: [id number] DOB/AGE: [birth year] 71 y.o.  Admit date: 06/21/2017 Discharge date: 06/22/2017  Admission Diagnoses:Cervical pseudoarthrosis, cervicalgia  Discharge Diagnoses: The same Active Problems:   Cervical pseudoarthrosis [facility])   Discharged Condition: good  Hospital Course: I performed an anterior and posterior cervical instrumentation and fusion on the patient on 06/21/2017. The surgery went well.  The patient's postoperative course was unremarkable. On postoperative day #1 the patient requested discharge to home. The patient, and her daughter, were given written and oral discharge instructions. All their questions were answered.  Consults: None Significant Diagnostic Studies: None Treatments: C4-5, C5-6 and C6-7 anterior and posterior instrumentation and fusion Discharge Exam: Blood pressure 111/74, pulse 67, temperature 98.4 F (36.9 C), temperature source Oral, resp. rate 18, height 5\' 4"  (1.626 m), weight 63.1 kg (139 lb 3.2 oz), SpO2 96 %. The patient is alert and pleasant. Her dressings are clean and dry. There is no evidence of hematoma or shift. She is moving all 4 extremities well. She looks well.  Disposition: Home  Discharge Instructions    Call MD for:  difficulty breathing, headache or visual disturbances    Complete by:  As directed    Call MD for:  extreme fatigue    Complete by:  As directed    Call MD for:  hives    Complete by:  As directed    Call MD for:  persistant dizziness or light-headedness    Complete by:  As directed    Call MD for:  persistant nausea and vomiting    Complete by:  As directed    Call MD for:  redness, tenderness, or signs of infection (pain, swelling, redness, odor or green/yellow discharge around incision site)    Complete by:  As directed    Call MD for:  severe uncontrolled pain    Complete by:  As directed    Call MD for:  temperature >100.4    Complete by:  As  directed    Diet - low sodium heart healthy    Complete by:  As directed    Discharge instructions    Complete by:  As directed    Call [phone] for a followup appointment. Take a stool softener while you are using pain medications.   Driving Restrictions    Complete by:  As directed    Do not drive for 2 weeks.   Increase activity slowly    Complete by:  As directed    Lifting restrictions    Complete by:  As directed    Do not lift more than 5 pounds. No excessive bending or twisting.   May shower / Bathe    Complete by:  As directed    He may shower after the pain she is removed 3 days after surgery. Leave the incision alone.   Remove dressing in 48 hours    Complete by:  As directed    Your stitches are under the scan and will dissolve by themselves. The Steri-Strips will fall off after you take a few showers. Do not rub back or pick at the wound, Leave the wound alone.     Allergies as of 06/22/2017      Reactions   Erythromycin Swelling   Latex Other (See Comments)   Blisters   Morphine Sulfate Other (See Comments)   Hallucinations/deep sleep   Clozapine Rash   Fentanyl Nausea Only   "Feels like you are a hundred miles  away when talking to her"      Medication List    STOP taking these medications   BC FAST PAIN RELIEF ARTHRITIS 1000-65 MG Pack Generic drug:  Aspirin-Caffeine   cyclobenzaprine 10 MG tablet Commonly known as:  FLEXERIL   oxyCODONE 5 MG immediate release tablet Commonly known as:  Oxy IR/ROXICODONE     TAKE these medications   alendronate 70 MG tablet Commonly known as:  FOSAMAX Take 70 mg by mouth once a week. Saturday   aspirin EC 81 MG tablet Take 81 mg by mouth daily.   budesonide-formoterol 160-4.5 MCG/ACT inhaler Commonly known as:  SYMBICORT Inhale 2 puffs into the lungs daily as needed.   calcium carbonate 600 MG Tabs tablet Commonly known as:  OS-CAL Take 600 mg by mouth daily.   cyanocobalamin 500 MCG tablet Take 500  mcg by mouth daily.   diltiazem 30 MG tablet Commonly known as:  CARDIZEM TAKE ONE TABLET BY MOUTH AT BEDTIME   docusate sodium 100 MG capsule Commonly known as:  COLACE Take 1 capsule (100 mg total) by mouth 2 (two) times daily.   DULoxetine 30 MG capsule Commonly known as:  CYMBALTA Take 1 capsule (30 mg total) by mouth daily. What changed:  when to take this   Fish Oil 1000 MG Caps Take 1,000 mg by mouth daily.   gabapentin 300 MG capsule Commonly known as:  NEURONTIN Take 300 mg by mouth 2 (two) times daily.   GAVILYTE-N WITH FLAVOR PACK 420 g solution Generic drug:  polyethylene glycol-electrolytes Take 4,000 mLs by mouth once.   hydrochlorothiazide 25 MG tablet Commonly known as:  HYDRODIURIL Take 25 mg by mouth daily.   levothyroxine 100 MCG tablet Commonly known as:  SYNTHROID, LEVOTHROID Take 100 mcg by mouth daily before breakfast. In the morning.   oxyCODONE-acetaminophen 5-325 MG tablet Commonly known as:  PERCOCET/ROXICET Take 1 tablet by mouth 2 (two) times daily as needed for pain. What changed:  Another medication with the same name was added. Make sure you understand how and when to take each.   oxyCODONE-acetaminophen 5-325 MG tablet Commonly known as:  PERCOCET/ROXICET Take 1-2 tablets by mouth every 4 (four) hours as needed for moderate pain. What changed:  You were already taking a medication with the same name, and this prescription was added. Make sure you understand how and when to take each.   pravastatin 40 MG tablet Commonly known as:  PRAVACHOL Take 40 mg by mouth at bedtime.   pregabalin 50 MG capsule Commonly known as:  LYRICA Take 1 capsule (50 mg total) by mouth 3 (three) times daily. What changed:  when to take this   traMADol 50 MG tablet Commonly known as:  ULTRAM Take 1 tablet (50 mg total) by mouth 2 (two) times daily.   traZODone 150 MG tablet Commonly known as:  DESYREL Take 150 mg by mouth at bedtime.             Discharge Care Instructions        Start     Ordered   06/22/17 0000  oxyCODONE-acetaminophen (PERCOCET/ROXICET) 5-325 MG tablet  Every 4 hours PRN     06/22/17 0748   06/22/17 0000  Discharge instructions    Comments:  Call [phone] for a followup appointment. Take a stool softener while you are using pain medications.   06/22/17 0748   06/22/17 0000  Increase activity slowly     06/22/17 0748   06/22/17 0000  May  shower / Bathe    Comments:  He may shower after the pain she is removed 3 days after surgery. Leave the incision alone.   06/22/17 0748   06/22/17 0000  Driving Restrictions    Comments:  Do not drive for 2 weeks.   06/22/17 0748   06/22/17 0000  Lifting restrictions    Comments:  Do not lift more than 5 pounds. No excessive bending or twisting.   06/22/17 0748   06/22/17 0000  Diet - low sodium heart healthy     06/22/17 0748   06/22/17 0000  Remove dressing in 48 hours    Comments:  Your stitches are under the scan and will dissolve by themselves. The Steri-Strips will fall off after you take a few showers. Do not rub back or pick at the wound, Leave the wound alone.   06/22/17 0748   06/22/17 0000  Call MD for:  temperature >100.4     06/22/17 0748   06/22/17 0000  Call MD for:  persistant nausea and vomiting     06/22/17 0748   06/22/17 0000  Call MD for:  severe uncontrolled pain     06/22/17 0748   06/22/17 0000  Call MD for:  redness, tenderness, or signs of infection (pain, swelling, redness, odor or green/yellow discharge around incision site)     06/22/17 0748   06/22/17 0000  Call MD for:  difficulty breathing, headache or visual disturbances     06/22/17 0748   06/22/17 0000  Call MD for:  hives     06/22/17 0748   06/22/17 0000  Call MD for:  persistant dizziness or light-headedness     06/22/17 0748   06/22/17 0000  Call MD for:  extreme fatigue     06/22/17 0748       Signed: Newman Pies D 06/22/2017, 7:48 AM

## 2017-06-23 ENCOUNTER — Other Ambulatory Visit: Payer: Self-pay | Admitting: Physical Medicine & Rehabilitation

## 2017-07-15 NOTE — Progress Notes (Signed)
HPI The patient presents for follow up of chest discomfort and palpitations. I saw her previously for this. She's had a stress test in 2015 which was unremarkable.  Since I last saw her she had neck surgery.  She says that this has been very painful.  She is doing as much as she can with activity.  The patient denies any new symptoms such as chest discomfort, neck or arm discomfort. There has been no new shortness of breath, PND or orthopnea. There has been no presyncope or syncope.  She does have palpitations chronically and these have not changed in frequency and severity.    Allergies  Allergen Reactions  . Erythromycin Swelling  . Latex Other (See Comments)    Blisters  . Morphine Sulfate Other (See Comments)    Hallucinations/deep sleep  . Clozapine Rash  . Fentanyl Nausea Only    "Feels like you are a hundred miles away when talking to her"    Current Outpatient Prescriptions  Medication Sig Dispense Refill  . alendronate (FOSAMAX) 70 MG tablet Take 70 mg by mouth once a week. Saturday    . aspirin EC 81 MG tablet Take 81 mg by mouth daily.    . budesonide-formoterol (SYMBICORT) 160-4.5 MCG/ACT inhaler Inhale 2 puffs into the lungs daily as needed. 1 Inhaler 3  . calcium carbonate (OS-CAL) 600 MG TABS Take 600 mg by mouth daily.     . cyanocobalamin 500 MCG tablet Take 500 mcg by mouth daily.     Marland Kitchen diltiazem (CARDIZEM) 30 MG tablet TAKE ONE TABLET BY MOUTH AT BEDTIME 90 tablet 3  . docusate sodium (COLACE) 100 MG capsule Take 1 capsule (100 mg total) by mouth 2 (two) times daily. 60 capsule 0  . DULoxetine (CYMBALTA) 30 MG capsule Take 1 capsule (30 mg total) by mouth daily. (Patient taking differently: Take 30 mg by mouth 2 (two) times daily. ) 30 capsule 5  . gabapentin (NEURONTIN) 300 MG capsule Take 300 mg by mouth 2 (two) times daily.     Marland Kitchen GAVILYTE-N WITH FLAVOR PACK 420 G solution Take 4,000 mLs by mouth once.     . hydrochlorothiazide 25 MG tablet Take 25 mg by mouth  daily.     Marland Kitchen levothyroxine (SYNTHROID, LEVOTHROID) 100 MCG tablet Take 100 mcg by mouth daily before breakfast. In the morning.  1  . Omega-3 Fatty Acids (FISH OIL) 1000 MG CAPS Take 1,000 mg by mouth daily.     Marland Kitchen oxyCODONE-acetaminophen (PERCOCET/ROXICET) 5-325 MG tablet Take 1 tablet by mouth 2 (two) times daily as needed for pain.  0  . oxyCODONE-acetaminophen (PERCOCET/ROXICET) 5-325 MG tablet Take 1-2 tablets by mouth every 4 (four) hours as needed for moderate pain. 50 tablet 0  . pravastatin (PRAVACHOL) 80 MG tablet Take 1 tablet (80 mg total) by mouth at bedtime. 90 tablet 3  . pregabalin (LYRICA) 50 MG capsule Take 1 capsule (50 mg total) by mouth 3 (three) times daily. (Patient taking differently: Take 50 mg by mouth 2 (two) times daily. ) 90 capsule 1  . traMADol (ULTRAM) 50 MG tablet Take 1 tablet (50 mg total) by mouth 2 (two) times daily. 60 tablet 2  . traZODone (DESYREL) 150 MG tablet Take 150 mg by mouth at bedtime.   0   No current facility-administered medications for this visit.     Past Medical History:  Diagnosis Date  . Anxiety   . Chronic pain syndrome   . COPD (chronic obstructive pulmonary disease) (  [place])   . Degenerative disc disease   . Degenerative joint disease   . Depression   . Essential hypertension, benign   . GERD (gastroesophageal reflux disease)   . Hyperlipidemia   . Hypothyroidism   . Insomnia   . Osteoarthritis   . Palpitations   . Restless leg syndrome   . TIA (transient ischemic attack)     Past Surgical History:  Procedure Laterality Date  . ABDOMINAL HYSTERECTOMY    . ANTERIOR CERVICAL DECOMP/DISCECTOMY FUSION N/A 05/07/2017   Procedure: ANTERIOR CERVICAL DECOMPRESSION/DISCECTOMY FUSION CERVICAL FOUR- CERVICAL FIVE, CERVICAL FIVE- CERVICAL SIX, CERVICAL SIX- CERVICAL SEVEN;  Surgeon: Newman Pies, MD;  Location: [place];  Service: Neurosurgery;  Laterality: N/A;  . ANTERIOR CERVICAL DECOMP/DISCECTOMY FUSION N/A 06/21/2017   Procedure:  Revision Anterior Instrumentation Cervical four-seven;  Surgeon: Newman Pies, MD;  Location: [place];  Service: Neurosurgery;  Laterality: N/A;  . APPENDECTOMY    . CATARACT EXTRACTION, BILATERAL    . CERVICAL SPINE SURGERY    . COLONOSCOPY  02/2013  . HEMORRHOID SURGERY  03/2013  . LUMBAR SPINE SURGERY     x 3  . POSTERIOR CERVICAL FUSION/FORAMINOTOMY N/A 06/21/2017   Procedure: POSTERIOR CERVICAL FUSION WITH LATERAL MASS FIXATION CERVICAL FOUR- CERVICAL FIVE, CERVICAL FIVE- CERVICAL SIX, CERVICAL SIX- CERVICAL SEVEN;  Surgeon: Newman Pies, MD;  Location: [place];  Service: Neurosurgery;  Laterality: N/A;    ROS:  HOH and neck pain. .  Otherwise, as stated in the HPI and negative for all other systems.   PHYSICAL EXAM BP 112/84   Pulse 72   Ht 5\' 4"  (1.626 m)   Wt 139 lb 6.4 oz (63.2 kg)   SpO2 95%   BMI 23.93 kg/m   GENERAL:  Well appearing NECK:  No jugular venous distention, waveform within normal limits, carotid upstroke brisk and symmetric, no bruits, no thyromegaly LUNGS:  Clear to auscultation bilaterally CHEST:  Unremarkable HEART:  PMI not displaced or sustained,S1 and S2 within normal limits, no S3, no S4, no clicks, no rubs, no murmurs ABD:  Flat, positive bowel sounds normal in frequency in pitch, no bruits, no rebound, no guarding, no midline pulsatile mass, no hepatomegaly, no splenomegaly EXT:  2 plus pulses throughout, no edema, no cyanosis no clubbing   ASSESSMENT AND PLAN  PALPITATIONS:  She will continue current Cardizem and can take this PRN as needed if the palpitations are particularly bad.   CAROTID PLAQUE:  This was mild on community screeing.  No change in therapy or further imaging at this point.   HTN:  The blood pressure is at target. No change in medications is indicated. We will continue with therapeutic lifestyle changes (TLC).  DYSLIPIDEMIA:  Her LDL in August was 161 with total cholesterol 266. I'm going to increase her Pravachol to 80 mg  daily and repeat a lipid profile in 8 weeks.

## 2017-07-16 ENCOUNTER — Ambulatory Visit (INDEPENDENT_AMBULATORY_CARE_PROVIDER_SITE_OTHER): Payer: Medicare Other | Admitting: Cardiology

## 2017-07-16 ENCOUNTER — Encounter: Payer: Self-pay | Admitting: Cardiology

## 2017-07-16 VITALS — BP 112/84 | HR 72 | Ht 64.0 in | Wt 139.4 lb

## 2017-07-16 DIAGNOSIS — E785 Hyperlipidemia, unspecified: Secondary | ICD-10-CM | POA: Diagnosis not present

## 2017-07-16 DIAGNOSIS — Z79899 Other long term (current) drug therapy: Secondary | ICD-10-CM | POA: Diagnosis not present

## 2017-07-16 DIAGNOSIS — R002 Palpitations: Secondary | ICD-10-CM | POA: Diagnosis not present

## 2017-07-16 MED ORDER — PRAVASTATIN SODIUM 80 MG PO TABS: 80.0000 mg | ORAL_TABLET | Freq: Every day | ORAL | 3 refills | Status: DC

## 2017-07-16 NOTE — Patient Instructions (Signed)
Medication Instructions:  INCREASE- Pravastatin 80 mg daily  If you need a refill on your cardiac medications before your next appointment, please call your pharmacy.  Labwork: Fasting Lipid Liver in 8 weeks HERE IN OUR OFFICE AT [facility]  Testing/Procedures: None Ordered  Follow-Up: Your physician wants you to follow-up in: 1 Year. You should receive a reminder letter in the mail two months in advance. If you do not receive a letter, please call our office [phone].    Thank you for choosing [facility] at [facility]!!

## 2017-07-18 ENCOUNTER — Encounter: Payer: Self-pay | Admitting: Cardiology

## 2017-08-03 ENCOUNTER — Encounter: Payer: Medicare Other | Attending: Physical Medicine & Rehabilitation

## 2017-08-03 ENCOUNTER — Encounter: Payer: Self-pay | Admitting: Physical Medicine & Rehabilitation

## 2017-08-03 ENCOUNTER — Ambulatory Visit (HOSPITAL_BASED_OUTPATIENT_CLINIC_OR_DEPARTMENT_OTHER): Payer: Medicare Other | Admitting: Physical Medicine & Rehabilitation

## 2017-08-03 VITALS — BP 114/74 | HR 63 | Resp 14

## 2017-08-03 DIAGNOSIS — M533 Sacrococcygeal disorders, not elsewhere classified: Secondary | ICD-10-CM | POA: Insufficient documentation

## 2017-08-03 DIAGNOSIS — M7062 Trochanteric bursitis, left hip: Secondary | ICD-10-CM | POA: Diagnosis not present

## 2017-08-03 DIAGNOSIS — Z888 Allergy status to other drugs, medicaments and biological substances status: Secondary | ICD-10-CM | POA: Diagnosis not present

## 2017-08-03 DIAGNOSIS — Z885 Allergy status to narcotic agent status: Secondary | ICD-10-CM | POA: Insufficient documentation

## 2017-08-03 DIAGNOSIS — Z91040 Latex allergy status: Secondary | ICD-10-CM | POA: Insufficient documentation

## 2017-08-03 DIAGNOSIS — M7061 Trochanteric bursitis, right hip: Secondary | ICD-10-CM | POA: Diagnosis not present

## 2017-08-03 DIAGNOSIS — Z881 Allergy status to other antibiotic agents status: Secondary | ICD-10-CM | POA: Diagnosis not present

## 2017-08-03 MED ORDER — PREGABALIN 50 MG PO CAPS: 50.0000 mg | ORAL_CAPSULE | Freq: Two times a day (BID) | ORAL | 5 refills | Status: DC

## 2017-08-03 NOTE — Patient Instructions (Signed)
If you start developing but ox pain. Please schedule sacroiliac injection, otherwise, we'll see you back in 3 months for repeat hip injection

## 2017-08-03 NOTE — Progress Notes (Addendum)
Bilateral Trochanteric bursa injection With ultrasound guidance  Indication Trochanteric bursitis. Exam has tenderness over the greater trochanter of the hip. Pain has not responded to conservative care such as exercise therapy and oral medications. Pain interferes with sleep or with mobility. She did not respond to palpation guided greater trochanteric bursa injections performed 3 months ago. Because of this ultrasound guidance is utilized Informed consent was obtained after describing risks and benefits of the procedure with the patient these include bleeding bruising and infection. Patient has signed written consent form. Patient placed in a lateral decubitus position with the affected hip superior. The greater trochanter was visualized with target on the posterior facet. 25-gauge 1.5 inch needle was used to anesthetize the skin and subcutaneous tissue with 1 cc 1% lidocaine. Then a 80 mm Echo block needle was inserted under direct ultrasound visualization targeting the posterior facet of the greater trochanter. Needle slightly withdrawn then celestone 6mg  and 1% lidocaine 21ml injected Left side was performed first, than the right side was performed Patient tolerated procedure well. Post procedure instructions given.

## 2017-08-17 ENCOUNTER — Telehealth: Payer: Self-pay | Admitting: *Deleted

## 2017-08-17 NOTE — Telephone Encounter (Signed)
I left a message for Emily Leonard to call the office.  She is scheduled on 09/04/17 for bilateral hip injections and that cannot be done until January. It is too early.

## 2017-08-21 NOTE — Telephone Encounter (Signed)
She would like to have the SI injection at her 09/04/18 appt. appt note to be changed and injection approved.

## 2017-08-30 ENCOUNTER — Other Ambulatory Visit: Payer: Self-pay | Admitting: *Deleted

## 2017-08-30 ENCOUNTER — Other Ambulatory Visit: Payer: Self-pay | Admitting: Physical Medicine & Rehabilitation

## 2017-08-30 MED ORDER — TRAMADOL HCL 50 MG PO TABS: 50.0000 mg | ORAL_TABLET | Freq: Two times a day (BID) | ORAL | 0 refills | Status: DC

## 2017-09-04 ENCOUNTER — Encounter: Payer: Self-pay | Admitting: Physical Medicine & Rehabilitation

## 2017-09-04 ENCOUNTER — Ambulatory Visit (HOSPITAL_BASED_OUTPATIENT_CLINIC_OR_DEPARTMENT_OTHER): Payer: Medicare Other | Admitting: Physical Medicine & Rehabilitation

## 2017-09-04 ENCOUNTER — Telehealth: Payer: Self-pay | Admitting: Physical Medicine & Rehabilitation

## 2017-09-04 ENCOUNTER — Encounter: Payer: Medicare Other | Attending: Physical Medicine & Rehabilitation

## 2017-09-04 VITALS — BP 132/81 | HR 60

## 2017-09-04 DIAGNOSIS — M533 Sacrococcygeal disorders, not elsewhere classified: Secondary | ICD-10-CM | POA: Diagnosis present

## 2017-09-04 DIAGNOSIS — Z91040 Latex allergy status: Secondary | ICD-10-CM | POA: Insufficient documentation

## 2017-09-04 DIAGNOSIS — Z888 Allergy status to other drugs, medicaments and biological substances status: Secondary | ICD-10-CM | POA: Insufficient documentation

## 2017-09-04 DIAGNOSIS — Z885 Allergy status to narcotic agent status: Secondary | ICD-10-CM | POA: Diagnosis not present

## 2017-09-04 DIAGNOSIS — Z881 Allergy status to other antibiotic agents status: Secondary | ICD-10-CM | POA: Insufficient documentation

## 2017-09-04 NOTE — Progress Notes (Signed)
  PROCEDURE RECORD [facility]   Name: ALONI CHUANG DOB:1946-01-26 MRN: [id number]  Date:09/04/2017  Physician: Alysia Penna, MD    Nurse/CMA: Earnie Rockhold CMA  Allergies:  Allergies  Allergen Reactions  . Erythromycin Swelling  . Latex Other (See Comments)    Blisters  . Morphine Sulfate Other (See Comments)    Hallucinations/deep sleep  . Clozapine Rash  . Fentanyl Nausea Only    "Feels like you are a hundred miles away when talking to her"    Consent Signed: Yes.    Is patient diabetic? No.  CBG today? NA  Pregnant: No. LMP: No LMP recorded. Patient has had a hysterectomy. (age 32-55)  Anticoagulants: no Anti-inflammatory: no Antibiotics: no  Procedure: bilateral sacroiliac injection  Position: Prone   Start Time: 324pm End Time: 330pm   Fluoro Time: 33s  RN/CMA Valita Righter CMA Saloma Cadena CMA    Time 213pm 335pm    BP 132/81 136/86    Pulse 60 66    Respirations 16 16    O2 Sat 96 95    S/S 6 6    Pain Level 9/10 6/10     D/C home with husband Ronnie, [name], D/C instructions reviewed, and sits independently.

## 2017-09-04 NOTE — Progress Notes (Signed)

## 2017-09-04 NOTE — Telephone Encounter (Signed)
Left message at home for Emily Leonard to confirm - her after visit summary is here - did she get a copy?

## 2017-09-04 NOTE — Patient Instructions (Signed)
Sacroiliac injection was performed today. A combination of a naming medicine plus a cortisone medicine was injected. The injection was done under x-ray guidance. This procedure has been performed to help reduce low back and buttocks pain as well as potentially hip pain. The duration of this injection is variable lasting from hours to  Months. It may repeated if needed. 

## 2017-09-25 ENCOUNTER — Ambulatory Visit: Payer: Medicare Other

## 2017-09-25 ENCOUNTER — Ambulatory Visit: Payer: Medicare Other | Admitting: Physical Medicine & Rehabilitation

## 2017-09-27 ENCOUNTER — Other Ambulatory Visit: Payer: Self-pay | Admitting: Physical Medicine & Rehabilitation

## 2017-10-30 ENCOUNTER — Other Ambulatory Visit: Payer: Self-pay | Admitting: Physical Medicine & Rehabilitation

## 2017-11-01 ENCOUNTER — Encounter: Payer: Medicare HMO | Attending: Physical Medicine & Rehabilitation

## 2017-11-01 ENCOUNTER — Ambulatory Visit (HOSPITAL_BASED_OUTPATIENT_CLINIC_OR_DEPARTMENT_OTHER): Payer: Medicare HMO | Admitting: Physical Medicine & Rehabilitation

## 2017-11-01 ENCOUNTER — Encounter: Payer: Self-pay | Admitting: Physical Medicine & Rehabilitation

## 2017-11-01 VITALS — BP 118/77 | HR 66

## 2017-11-01 DIAGNOSIS — Z91040 Latex allergy status: Secondary | ICD-10-CM | POA: Diagnosis not present

## 2017-11-01 DIAGNOSIS — M7061 Trochanteric bursitis, right hip: Secondary | ICD-10-CM | POA: Diagnosis not present

## 2017-11-01 DIAGNOSIS — Z885 Allergy status to narcotic agent status: Secondary | ICD-10-CM | POA: Diagnosis not present

## 2017-11-01 DIAGNOSIS — Z888 Allergy status to other drugs, medicaments and biological substances status: Secondary | ICD-10-CM | POA: Diagnosis not present

## 2017-11-01 DIAGNOSIS — M533 Sacrococcygeal disorders, not elsewhere classified: Secondary | ICD-10-CM | POA: Insufficient documentation

## 2017-11-01 DIAGNOSIS — M25552 Pain in left hip: Secondary | ICD-10-CM

## 2017-11-01 DIAGNOSIS — Z881 Allergy status to other antibiotic agents status: Secondary | ICD-10-CM | POA: Insufficient documentation

## 2017-11-01 DIAGNOSIS — G8929 Other chronic pain: Secondary | ICD-10-CM

## 2017-11-01 DIAGNOSIS — M7062 Trochanteric bursitis, left hip: Secondary | ICD-10-CM | POA: Diagnosis not present

## 2017-11-01 DIAGNOSIS — M25551 Pain in right hip: Secondary | ICD-10-CM

## 2017-11-01 MED ORDER — TRAMADOL HCL 50 MG PO TABS: 50.0000 mg | ORAL_TABLET | Freq: Two times a day (BID) | ORAL | 1 refills | Status: DC

## 2017-11-01 NOTE — Progress Notes (Signed)
Bilateral Trochanteric bursa injection With ultrasound guidance  Indication Trochanteric bursitis. Exam has tenderness over the greater trochanter of the hip. Pain has not responded to conservative care such as exercise therapy and oral medications. Pain interferes with sleep or with mobility. She did not respond to palpation guided greater trochanteric bursa injections performed 3 months ago. Because of this ultrasound guidance is utilized Informed consent was obtained after describing risks and benefits of the procedure with the patient these include bleeding bruising and infection. Patient has signed written consent form. Patient placed in a lateral decubitus position with the affected hip superior. The greater trochanter was visualized with target on the posterior facet. 25-gauge 1.5 inch needle was used to anesthetize the skin and subcutaneous tissue with 1 cc 1% lidocaine. Then a 80 mm Echo block needle was inserted under direct ultrasound visualization targeting the posterior facet of the greater trochanter. Needle slightly withdrawn then celestone 6mg  and 1% lidocaine 47ml injected Left side was performed first, than the right side was performed Patient tolerated procedure well. Post procedure instructions given.

## 2017-11-01 NOTE — Patient Instructions (Signed)
Will follow up xrays next visit

## 2017-11-02 ENCOUNTER — Ambulatory Visit: Payer: Medicare Other | Admitting: Physical Medicine & Rehabilitation

## 2017-11-09 ENCOUNTER — Telehealth: Payer: Self-pay

## 2017-11-09 ENCOUNTER — Ambulatory Visit
Admission: RE | Admit: 2017-11-09 | Discharge: 2017-11-09 | Disposition: A | Discharge status: Home / Self Care | Payer: Medicare HMO | Source: Ambulatory Visit | Attending: Physical Medicine & Rehabilitation | Admitting: Physical Medicine & Rehabilitation

## 2017-11-09 DIAGNOSIS — M25551 Pain in right hip: Principal | ICD-10-CM

## 2017-11-09 DIAGNOSIS — M25552 Pain in left hip: Principal | ICD-10-CM

## 2017-11-09 DIAGNOSIS — G8929 Other chronic pain: Secondary | ICD-10-CM

## 2017-11-09 NOTE — Telephone Encounter (Signed)
Recieved faxed notification of prior auth for tramadol, started today 11-09-2017

## 2017-11-12 NOTE — Telephone Encounter (Signed)
Prior auth for tramdol approved from [facility] 10/14/17 through 10/15/18

## 2017-11-13 ENCOUNTER — Telehealth: Payer: Self-pay | Admitting: *Deleted

## 2017-11-13 NOTE — Telephone Encounter (Signed)
Patient left a message asking for results of her Hip x-rays....please advise

## 2017-11-13 NOTE — Telephone Encounter (Signed)
There is mild arthritis of hip joints Right > Left Also calcium deposits at the hip bursa Right > Left  We could try a different type of hip injection using Xray guidance to inject the Right hip joint

## 2017-11-14 NOTE — Telephone Encounter (Signed)
Called patient and informed her of doctors findings and advice.

## 2017-11-19 NOTE — Telephone Encounter (Signed)
Patient called today, states is ready to be placed onto schedule for procedures for injections.

## 2017-12-11 ENCOUNTER — Encounter: Payer: Self-pay | Admitting: Physical Medicine & Rehabilitation

## 2017-12-11 ENCOUNTER — Encounter: Payer: Medicare HMO | Attending: Physical Medicine & Rehabilitation

## 2017-12-11 ENCOUNTER — Ambulatory Visit (HOSPITAL_BASED_OUTPATIENT_CLINIC_OR_DEPARTMENT_OTHER): Payer: Medicare HMO | Admitting: Physical Medicine & Rehabilitation

## 2017-12-11 VITALS — BP 117/78 | HR 60

## 2017-12-11 DIAGNOSIS — Z888 Allergy status to other drugs, medicaments and biological substances status: Secondary | ICD-10-CM | POA: Insufficient documentation

## 2017-12-11 DIAGNOSIS — M7061 Trochanteric bursitis, right hip: Secondary | ICD-10-CM | POA: Diagnosis not present

## 2017-12-11 DIAGNOSIS — Z881 Allergy status to other antibiotic agents status: Secondary | ICD-10-CM | POA: Insufficient documentation

## 2017-12-11 DIAGNOSIS — M7062 Trochanteric bursitis, left hip: Secondary | ICD-10-CM

## 2017-12-11 DIAGNOSIS — M961 Postlaminectomy syndrome, not elsewhere classified: Secondary | ICD-10-CM

## 2017-12-11 DIAGNOSIS — Z91040 Latex allergy status: Secondary | ICD-10-CM | POA: Diagnosis not present

## 2017-12-11 DIAGNOSIS — M533 Sacrococcygeal disorders, not elsewhere classified: Secondary | ICD-10-CM | POA: Insufficient documentation

## 2017-12-11 DIAGNOSIS — M1611 Unilateral primary osteoarthritis, right hip: Secondary | ICD-10-CM

## 2017-12-11 DIAGNOSIS — Z885 Allergy status to narcotic agent status: Secondary | ICD-10-CM | POA: Diagnosis not present

## 2017-12-11 NOTE — Progress Notes (Signed)
Subjective:    Patient ID: Emily Leonard, female    DOB: [birth year], 72 y.o.   MRN: [id number]  HPI 72 year old female with history of L2 through L5 lumbar.  Patient has had chronic postoperative pain.  She has tried high-dose narcotic analgesic such as fentanyl patch but this resulted in mental status changes.  She has been able to tolerate tramadol at a low dose. Patient is undergone bilateral trochanteric bursa injection on 11/01/2017 with good relief of her left-sided symptoms however the right-sided symptoms did not improve.  Her pain is mainly in the groin area she has no numbness or tingling in her thigh. She has recently undergone repeat lumbar MRI performed at her neurosurgery office.  She had a follow-up appointment with her surgeon Dr. Arnoldo Morale who did not recommend surgery at the current time and had a 57-month follow-up planned Pain Inventory Average Pain 9 Pain Right Now 9 My pain is constant and dull  In the last 24 hours, has pain interfered with the following? General activity 4 Relation with others 7 Enjoyment of life 10 What TIME of day is your pain at its worst? . Sleep (in general) Poor  Pain is worse with: . Pain improves with: . Relief from Meds: 0  Mobility walk without assistance ability to climb steps?  yes do you drive?  yes  Function retired  Neuro/Psych No problems in this area  Prior Studies Any changes since last visit?  no  Physicians involved in your care Any changes since last visit?  no   Family History  Problem Relation Age of Onset  . Hypertension Mother   . Hypertension Father   . Heart attack Father 51  . Heart attack Brother 3   Social History   Socioeconomic History  . Marital status: Married    Spouse name: Not on file  . Number of children: 4  . Years of education: Not on file  . Highest education level: Not on file  Social Needs  . Financial resource strain: Not on file  . Food insecurity - worry: Not on file  .  Food insecurity - inability: Not on file  . Transportation needs - medical: Not on file  . Transportation needs - non-medical: Not on file  Occupational History  . Not on file  Tobacco Use  . Smoking status: Current Every Day Smoker    Packs/day: 0.75    Years: 30.00    Pack years: 22.50    Types: Cigarettes  . Smokeless tobacco: Never Used  Substance and Sexual Activity  . Alcohol use: Yes    Alcohol/week: 0.6 oz    Types: 1 Standard drinks or equivalent per week    Comment: rarely  . Drug use: No  . Sexual activity: Not on file  Other Topics Concern  . Not on file  Social History Narrative  . Not on file   Past Surgical History:  Procedure Laterality Date  . ABDOMINAL HYSTERECTOMY    . ANTERIOR CERVICAL DECOMP/DISCECTOMY FUSION N/A 05/07/2017   Procedure: ANTERIOR CERVICAL DECOMPRESSION/DISCECTOMY FUSION CERVICAL FOUR- CERVICAL FIVE, CERVICAL FIVE- CERVICAL SIX, CERVICAL SIX- CERVICAL SEVEN;  Surgeon: Newman Pies, MD;  Location: [place];  Service: Neurosurgery;  Laterality: N/A;  . ANTERIOR CERVICAL DECOMP/DISCECTOMY FUSION N/A 06/21/2017   Procedure: Revision Anterior Instrumentation Cervical four-seven;  Surgeon: Newman Pies, MD;  Location: [place];  Service: Neurosurgery;  Laterality: N/A;  . APPENDECTOMY    . CATARACT EXTRACTION, BILATERAL    . CERVICAL SPINE  SURGERY    . COLONOSCOPY  02/2013  . HEMORRHOID SURGERY  03/2013  . LUMBAR SPINE SURGERY     x 3  . POSTERIOR CERVICAL FUSION/FORAMINOTOMY N/A 06/21/2017   Procedure: POSTERIOR CERVICAL FUSION WITH LATERAL MASS FIXATION CERVICAL FOUR- CERVICAL FIVE, CERVICAL FIVE- CERVICAL SIX, CERVICAL SIX- CERVICAL SEVEN;  Surgeon: Newman Pies, MD;  Location: [place];  Service: Neurosurgery;  Laterality: N/A;   Past Medical History:  Diagnosis Date  . Anxiety   . Chronic pain syndrome   . COPD (chronic obstructive pulmonary disease) ([place])   . Degenerative disc disease   . Degenerative joint disease   . Depression   .  Essential hypertension, benign   . GERD (gastroesophageal reflux disease)   . Hyperlipidemia   . Hypothyroidism   . Insomnia   . Osteoarthritis   . Palpitations   . Restless leg syndrome   . TIA (transient ischemic attack)    There were no vitals taken for this visit.  Opioid Risk Score:   Fall Risk Score:  `1  Depression screen PHQ 2/9  Depression screen [facility] 2/9 11/16/2016 12/31/2014  Decreased Interest 0 3  Down, Depressed, Hopeless 0 0  PHQ - 2 Score 0 3  Altered sleeping - 0  Tired, decreased energy - 1  Change in appetite - 0  Feeling bad or failure about yourself  - 0  Trouble concentrating - 0  Moving slowly or fidgety/restless - 0  Suicidal thoughts - 0  PHQ-9 Score - 4     Review of Systems  Constitutional: Negative.   HENT: Negative.   Eyes: Negative.   Respiratory: Negative.   Cardiovascular: Negative.   Gastrointestinal: Negative.   Endocrine: Negative.   Genitourinary: Negative.   Musculoskeletal: Positive for arthralgias and myalgias.  Skin: Negative.   Allergic/Immunologic: Negative.   Neurological: Negative.   Hematological: Negative.   Psychiatric/Behavioral: Negative.   All other systems reviewed and are negative.      Objective:   Physical Exam  Constitutional: She is oriented to person, place, and time. She appears well-developed and well-nourished. No distress.  HENT:  Head: Normocephalic and atraumatic.  Eyes: Conjunctivae and EOM are normal. Pupils are equal, round, and reactive to light.  Neurological: She is alert and oriented to person, place, and time. She exhibits normal muscle tone. Coordination and gait normal.  Reflex Scores:      Patellar reflexes are 2+ on the right side and 2+ on the left side.      Achilles reflexes are 2+ on the right side and 2+ on the left side. Negative straight leg raise Ambulates without assistive device no evidence of toe drag or knee instability  Skin: She is not diaphoretic.  Psychiatric: She has  a normal mood and affect.  Nursing note and vitals reviewed.  Patient is pain in the groin area with hip internal rotation external rotation and flexion.  She is able to lay flat without evidence of hip flexion contracture. There is no pain in the right knee.  No right knee effusion. Mild pain in the PSIS area bilaterally. Mild tenderness palpation in the lumbosacral junction and lumbar paraspinal muscles       Assessment & Plan:  #1.  Lumbar postlaminectomy syndrome with chronic postoperative pain.  She has sacroiliac dysfunction as is common after lumbar fusion.  She also has some progression at L5-S1 in terms of her symptomatology followed up with her neurosurgeon for this. She has no clear-cut radicular symptoms. Her hip  pain appears to be intra-articular, x-rays show right upper and left mild DJD.  Would recommend intra-articular injection under fluoroscopic guidance.

## 2017-12-25 ENCOUNTER — Other Ambulatory Visit: Payer: Self-pay | Admitting: Physical Medicine & Rehabilitation

## 2018-01-01 ENCOUNTER — Encounter: Payer: Self-pay | Admitting: Physical Medicine & Rehabilitation

## 2018-01-01 ENCOUNTER — Ambulatory Visit (HOSPITAL_BASED_OUTPATIENT_CLINIC_OR_DEPARTMENT_OTHER): Payer: Medicare HMO | Admitting: Physical Medicine & Rehabilitation

## 2018-01-01 ENCOUNTER — Encounter: Payer: Medicare HMO | Attending: Physical Medicine & Rehabilitation

## 2018-01-01 DIAGNOSIS — M1611 Unilateral primary osteoarthritis, right hip: Secondary | ICD-10-CM

## 2018-01-01 DIAGNOSIS — Z91040 Latex allergy status: Secondary | ICD-10-CM | POA: Insufficient documentation

## 2018-01-01 DIAGNOSIS — M533 Sacrococcygeal disorders, not elsewhere classified: Secondary | ICD-10-CM | POA: Diagnosis present

## 2018-01-01 DIAGNOSIS — Z885 Allergy status to narcotic agent status: Secondary | ICD-10-CM | POA: Insufficient documentation

## 2018-01-01 DIAGNOSIS — Z888 Allergy status to other drugs, medicaments and biological substances status: Secondary | ICD-10-CM | POA: Insufficient documentation

## 2018-01-01 DIAGNOSIS — Z881 Allergy status to other antibiotic agents status: Secondary | ICD-10-CM | POA: Diagnosis not present

## 2018-01-01 MED ORDER — PREGABALIN 50 MG PO CAPS: 50.0000 mg | ORAL_CAPSULE | Freq: Two times a day (BID) | ORAL | 5 refills | Status: DC

## 2018-01-01 NOTE — Progress Notes (Signed)
  PROCEDURE RECORD  [facility] Medicine and Rehabilitation   Name: MAXYNE DEROCHER DOB:Jul 13, 1946 MRN: [id number]  Date:01/01/2018  Physician: Alysia Penna, MD    Nurse/CMA: Garris Melhorn CMA  Allergies:  Allergies  Allergen Reactions  . Erythromycin Swelling  . Latex Other (See Comments)    Blisters  . Morphine Sulfate Other (See Comments)    Hallucinations/deep sleep  . Clozapine Rash  . Fentanyl Nausea Only    "Feels like you are a hundred miles away when talking to her"    Consent Signed: Yes.    Is patient diabetic? No.  CBG today? NA  Pregnant: No. LMP: No LMP recorded. Patient has had a hysterectomy. (age 89-55)  Anticoagulants: no Anti-inflammatory: no Antibiotics: no  Procedure: Right Intra articular Hip Inj       Position: Supine  Start Time: 10:43am End Time: 1048am Fluoro Time: 18s  RN/CMA Forney Kleinpeter CMA Jaydon Soroka CMA    Time 10:26am 10:52am    BP 143/87 133/80    Pulse 62 62    Respirations 16 16    O2 Sat 97 96    S/S 6 6    Pain Level 10/10 7/10     D/C home with Husband, [name], D/C instructions reviewed, and sits independently.

## 2018-01-01 NOTE — Progress Notes (Signed)
Right hip intra-articular injection under Fluoro guidance  Indication osteoarthritis unresponsive to conservative care including exercise and oral medications  Informed consent was obtained after describing risks and benefits of the procedure, this includes bleeding bruising and infection. The patient elected to proceed and has given written consent Patient placed supine on exam table. 4 Hz curvilinear transducer utilized. Femur was identified distally and followed proximally to the femoral neck. Area was marked and prepped with Betadine. 25-gauge 1.5 inch needle was utilized to anesthetize the skin and subcutaneous tissue with 3 cc of 1% lidocaine. Then a 22-gauge 3.5 inch spinal needle was inserted under direct fluoro guidance, targeting the junction of the femoral head and femoral neck. Bone contact was made. Then a solution containing 1.5 mL of 6 mg per mL/tone and 3.5 ML of 1% lidocaine were injected after negative drawback for blood. Patient tolerated procedure well. Post procedure instructions given.  RTC 64mo Right hip vs Sacroiliac injection  Refill Lyrica 50mg  BID Cont tramadol 50mg  BID prn

## 2018-02-01 ENCOUNTER — Other Ambulatory Visit: Payer: Self-pay | Admitting: Physical Medicine & Rehabilitation

## 2018-02-06 ENCOUNTER — Other Ambulatory Visit: Payer: Self-pay | Admitting: Cardiology

## 2018-02-06 NOTE — Telephone Encounter (Signed)
REFILL 

## 2018-02-21 ENCOUNTER — Ambulatory Visit (HOSPITAL_BASED_OUTPATIENT_CLINIC_OR_DEPARTMENT_OTHER): Payer: Medicare HMO | Admitting: Physical Medicine & Rehabilitation

## 2018-02-21 ENCOUNTER — Encounter: Payer: Self-pay | Admitting: Physical Medicine & Rehabilitation

## 2018-02-21 ENCOUNTER — Encounter: Payer: Medicare HMO | Attending: Physical Medicine & Rehabilitation

## 2018-02-21 VITALS — BP 121/88 | HR 60 | Ht 64.0 in | Wt 139.6 lb

## 2018-02-21 DIAGNOSIS — Z91040 Latex allergy status: Secondary | ICD-10-CM | POA: Insufficient documentation

## 2018-02-21 DIAGNOSIS — Z885 Allergy status to narcotic agent status: Secondary | ICD-10-CM | POA: Insufficient documentation

## 2018-02-21 DIAGNOSIS — Z881 Allergy status to other antibiotic agents status: Secondary | ICD-10-CM | POA: Diagnosis not present

## 2018-02-21 DIAGNOSIS — M7062 Trochanteric bursitis, left hip: Secondary | ICD-10-CM | POA: Diagnosis not present

## 2018-02-21 DIAGNOSIS — M533 Sacrococcygeal disorders, not elsewhere classified: Secondary | ICD-10-CM | POA: Insufficient documentation

## 2018-02-21 DIAGNOSIS — M7061 Trochanteric bursitis, right hip: Secondary | ICD-10-CM | POA: Diagnosis not present

## 2018-02-21 DIAGNOSIS — Z888 Allergy status to other drugs, medicaments and biological substances status: Secondary | ICD-10-CM | POA: Diagnosis not present

## 2018-02-21 MED ORDER — TRAMADOL HCL 50 MG PO TABS: 50.0000 mg | ORAL_TABLET | Freq: Two times a day (BID) | ORAL | 1 refills | Status: DC

## 2018-02-21 NOTE — Progress Notes (Signed)
Right Trochanteric bursa injection With or without ultrasound guidance  Good relief of groin pain s/p Right intra articular inj under fluoro 3/19  Indication Trochanteric bursitis. Exam has tenderness over the greater trochanter of the hip. Pain has not responded to conservative care such as exercise therapy and oral medications. Pain interferes with sleep or with mobility Informed consent was obtained after describing risks and benefits of the procedure with the patient these include bleeding bruising and infection. Patient has signed written consent form. Patient placed in a lateral decubitus position with the affected hip superior. Point of maximal pain was palpated marked and prepped with Betadine and entered with a needle to bone contact. Needle slightly withdrawn then 6mg  of betamethasone with 4 cc 1% lidocaine were injected. Patient tolerated procedure well. Post procedure instructions given.

## 2018-02-21 NOTE — Patient Instructions (Signed)
Please call for another injection

## 2018-04-02 ENCOUNTER — Encounter: Payer: Medicare HMO | Attending: Physical Medicine & Rehabilitation

## 2018-04-02 ENCOUNTER — Ambulatory Visit (HOSPITAL_BASED_OUTPATIENT_CLINIC_OR_DEPARTMENT_OTHER): Payer: Medicare HMO | Admitting: Physical Medicine & Rehabilitation

## 2018-04-02 ENCOUNTER — Encounter: Payer: Self-pay | Admitting: Physical Medicine & Rehabilitation

## 2018-04-02 VITALS — BP 122/82 | HR 57 | Resp 14 | Ht 64.0 in | Wt 138.0 lb

## 2018-04-02 DIAGNOSIS — Z888 Allergy status to other drugs, medicaments and biological substances status: Secondary | ICD-10-CM | POA: Diagnosis not present

## 2018-04-02 DIAGNOSIS — Z881 Allergy status to other antibiotic agents status: Secondary | ICD-10-CM | POA: Insufficient documentation

## 2018-04-02 DIAGNOSIS — M533 Sacrococcygeal disorders, not elsewhere classified: Secondary | ICD-10-CM | POA: Diagnosis not present

## 2018-04-02 DIAGNOSIS — Z91040 Latex allergy status: Secondary | ICD-10-CM | POA: Diagnosis not present

## 2018-04-02 DIAGNOSIS — Z885 Allergy status to narcotic agent status: Secondary | ICD-10-CM | POA: Insufficient documentation

## 2018-04-02 NOTE — Progress Notes (Signed)
  PROCEDURE RECORD [facility]   Name: Emily Leonard DOB:December 05, 1945 MRN: [id number]  Date:04/02/2018  Physician: Alysia Penna, MD    Nurse/CMA: Jaymi Tinner, CMA  Allergies:  Allergies  Allergen Reactions  . Erythromycin Swelling  . Latex Other (See Comments)    Blisters  . Morphine Sulfate Other (See Comments)    Hallucinations/deep sleep  . Clozapine Rash  . Fentanyl Nausea Only    "Feels like you are a hundred miles away when talking to her"    Consent Signed: Yes.    Is patient diabetic? No.  CBG today?   Pregnant: No. LMP: No LMP recorded. Patient has had a hysterectomy. (age 58-55)  Anticoagulants: no Anti-inflammatory: no Antibiotics: no  Procedure: bilateral sacroiliac steroid injection  Position: Prone Start Time: 10:50am   End Time: 10:59am  Fluoro Time: 26  RN/CMA Yidel Teuscher, CMA Jenesis Martin, CMA    Time 10:35am 11:05am    BP 122/82 144/75    Pulse 57 61    Respirations 14 14    O2 Sat 95 96    S/S 6 6    Pain Level 9/10 7/10     D/C home with husband, [name], D/C instructions reviewed, and sits independently.

## 2018-04-02 NOTE — Patient Instructions (Signed)
Sacroiliac injection was performed today. A combination of a naming medicine plus a cortisone medicine was injected. The injection was done under x-ray guidance. This procedure has been performed to help reduce low back and buttocks pain as well as potentially hip pain. The duration of this injection is variable lasting from hours to  Months. It may repeated if needed. 

## 2018-04-15 IMAGING — RF DG C-ARM GT 120 MIN
1 series · 3 of 3 positions shown · non-contrast
Comparison: June 08, 2017.

CLINICAL DATA: C4-C7 posterior fusion.  Revision C4-C7 ACDF.

EXAM:
CERVICAL SPINE 1 VIEW

[Series 1: run · 3 of 3 slices shown]
[im 1/3]
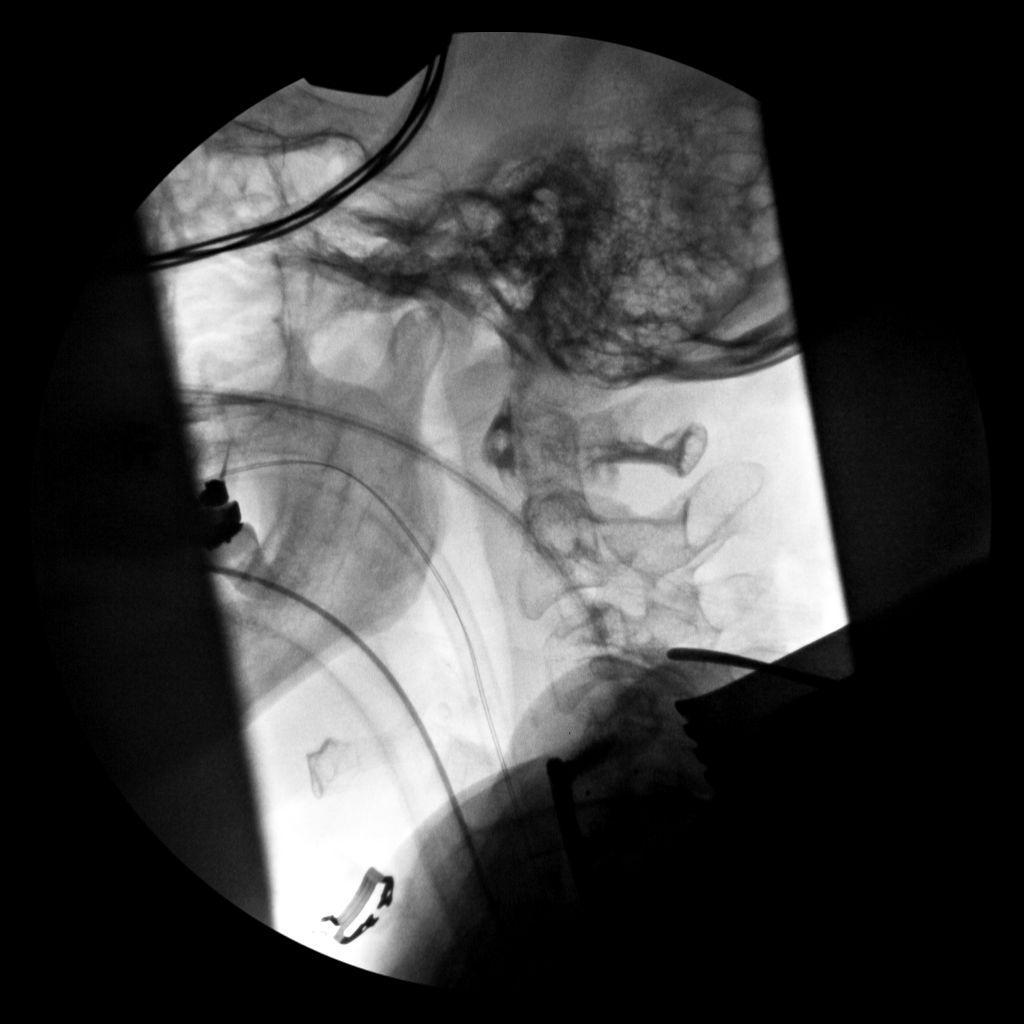
[im 2/3]
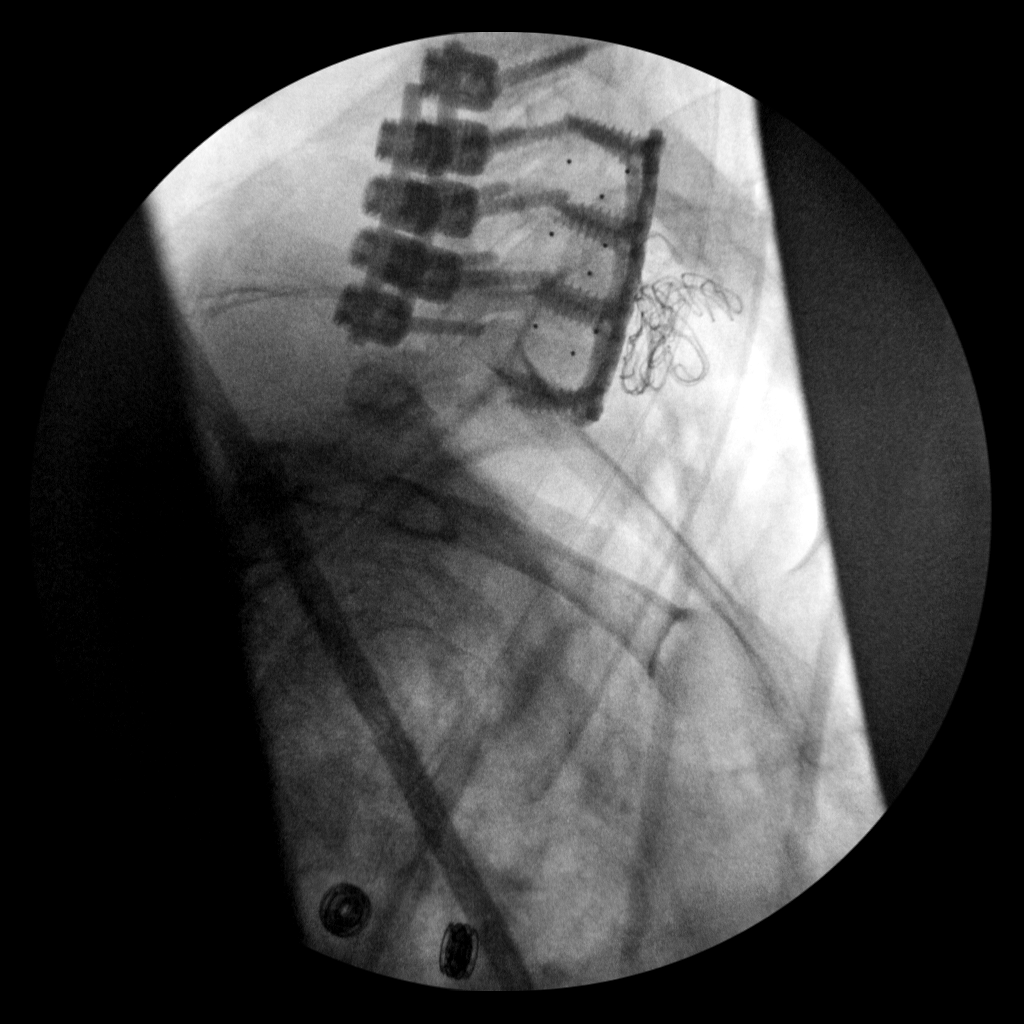
[im 3/3]
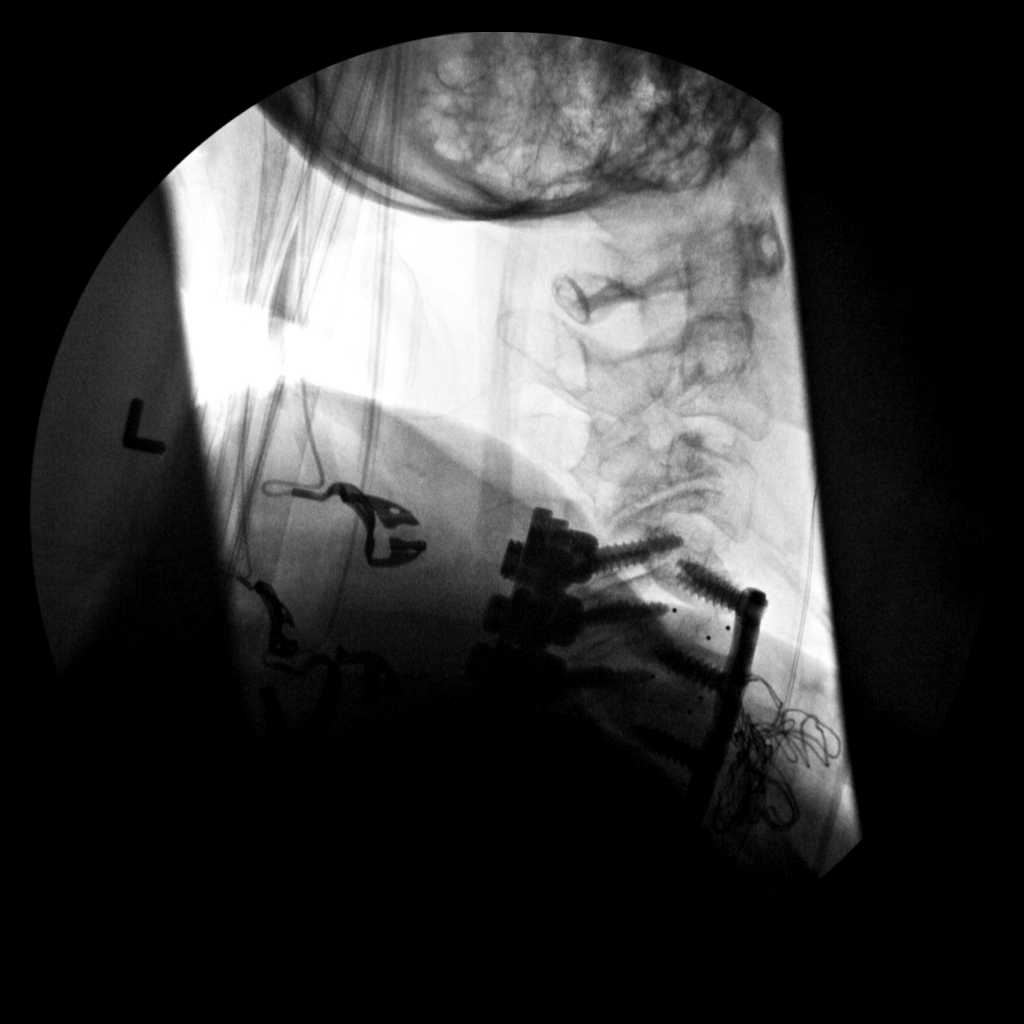

[3 of 3 positions shown; findings below may reference images not displayed]

FLUOROSCOPY TIME:  27 seconds.

C-arm fluoroscopic images were obtained intraoperatively and
submitted for post operative interpretation.
FINDINGS: Lateral intraoperative x-rays demonstrate interval posterior fusion
from C4-C7. C4-C7 ACDF hardware is again noted. Alignment is normal.
IMPRESSION: Interval C4-C7 posterior fusion.  Prior C4-C7 ACDF.

## 2018-05-03 ENCOUNTER — Other Ambulatory Visit: Payer: Self-pay | Admitting: Physical Medicine & Rehabilitation

## 2018-06-10 ENCOUNTER — Encounter: Payer: Medicare HMO | Attending: Physical Medicine & Rehabilitation

## 2018-06-10 ENCOUNTER — Other Ambulatory Visit: Payer: Self-pay

## 2018-06-10 ENCOUNTER — Ambulatory Visit: Payer: Medicare HMO | Admitting: Physical Medicine & Rehabilitation

## 2018-06-10 ENCOUNTER — Encounter: Payer: Self-pay | Admitting: Physical Medicine & Rehabilitation

## 2018-06-10 VITALS — BP 118/75 | HR 57 | Ht 64.0 in | Wt 139.6 lb

## 2018-06-10 DIAGNOSIS — Z5181 Encounter for therapeutic drug level monitoring: Secondary | ICD-10-CM

## 2018-06-10 DIAGNOSIS — M533 Sacrococcygeal disorders, not elsewhere classified: Secondary | ICD-10-CM | POA: Diagnosis not present

## 2018-06-10 DIAGNOSIS — Z888 Allergy status to other drugs, medicaments and biological substances status: Secondary | ICD-10-CM | POA: Insufficient documentation

## 2018-06-10 DIAGNOSIS — Z91040 Latex allergy status: Secondary | ICD-10-CM | POA: Insufficient documentation

## 2018-06-10 DIAGNOSIS — G894 Chronic pain syndrome: Secondary | ICD-10-CM

## 2018-06-10 DIAGNOSIS — Z881 Allergy status to other antibiotic agents status: Secondary | ICD-10-CM | POA: Insufficient documentation

## 2018-06-10 DIAGNOSIS — Z885 Allergy status to narcotic agent status: Secondary | ICD-10-CM | POA: Diagnosis not present

## 2018-06-10 DIAGNOSIS — Z79891 Long term (current) use of opiate analgesic: Secondary | ICD-10-CM

## 2018-06-10 MED ORDER — PREGABALIN 50 MG PO CAPS: 50.0000 mg | ORAL_CAPSULE | Freq: Two times a day (BID) | ORAL | 5 refills | Status: DC

## 2018-06-10 MED ORDER — TRAMADOL HCL 50 MG PO TABS: 50.0000 mg | ORAL_TABLET | Freq: Three times a day (TID) | ORAL | 5 refills | Status: DC | PRN

## 2018-06-10 NOTE — Progress Notes (Signed)
Subjective:    Patient ID: Emily Leonard, female    DOB: Nov 20, 1945, 72 y.o.   MRN: [id number]  HPI  RA diagnosed by rheumatogist nper pt but after review of Rheum visit 05/13/18, w/u was negative for RA  Pt requiring freq repetition of info, does better with louder vocal volume  Right hip intra articular injection Bilateral Sacroiliac injection 04/02/18 NS was prescribing hydrocodone for post op C4-C7 PCF - pt states "now you have to" , pt states she has been on this for 2-3 yrs and we reviewed NS op note which was dated 06/21/18, PMP aware website indicates Rx written every 2 months per Dr Arnoldo Morale  Hydrocodone 5mg  , #50  Pt has never tried taking 3 tramadol per day Pain Inventory Average Pain 10 Pain Right Now 10 My pain is aching  In the last 24 hours, has pain interfered with the following? General activity 8 Relation with others 10 Enjoyment of life 10 What TIME of day is your pain at its worst? all Sleep (in general) Fair  Pain is worse with: walking, bending, sitting, inactivity, standing and some activites Pain improves with: medication Relief from Meds: 1  Mobility how many minutes can you walk? 35 ability to climb steps?  yes do you drive?  yes Do you have any goals in this area?  yes  Function retired  Neuro/Psych trouble walking  Prior Studies Any changes since last visit?  yes  Physicians involved in your care Any changes since last visit?  yes Rheumatologist [employer] Rheumatologist   Family History  Problem Relation Age of Onset  . Hypertension Mother   . Hypertension Father   . Heart attack Father 63  . Heart attack Brother 37   Social History   Socioeconomic History  . Marital status: Married    Spouse name: Not on file  . Number of children: 4  . Years of education: Not on file  . Highest education level: Not on file  Occupational History  . Not on file  Social Needs  . Financial resource strain: Not on file  . Food insecurity:      Worry: Not on file    Inability: Not on file  . Transportation needs:    Medical: Not on file    Non-medical: Not on file  Tobacco Use  . Smoking status: Current Every Day Smoker    Packs/day: 0.75    Years: 30.00    Pack years: 22.50    Types: Cigarettes  . Smokeless tobacco: Never Used  Substance and Sexual Activity  . Alcohol use: Yes    Alcohol/week: 1.0 standard drinks    Types: 1 Standard drinks or equivalent per week    Comment: rarely  . Drug use: No  . Sexual activity: Not on file  Lifestyle  . Physical activity:    Days per week: Not on file    Minutes per session: Not on file  . Stress: Not on file  Relationships  . Social connections:    Talks on phone: Not on file    Gets together: Not on file    Attends religious service: Not on file    Active member of club or organization: Not on file    Attends meetings of clubs or organizations: Not on file    Relationship status: Not on file  Other Topics Concern  . Not on file  Social History Narrative  . Not on file   Past Surgical History:  Procedure  Laterality Date  . ABDOMINAL HYSTERECTOMY    . ANTERIOR CERVICAL DECOMP/DISCECTOMY FUSION N/A 05/07/2017   Procedure: ANTERIOR CERVICAL DECOMPRESSION/DISCECTOMY FUSION CERVICAL FOUR- CERVICAL FIVE, CERVICAL FIVE- CERVICAL SIX, CERVICAL SIX- CERVICAL SEVEN;  Surgeon: Newman Pies, MD;  Location: [place];  Service: Neurosurgery;  Laterality: N/A;  . ANTERIOR CERVICAL DECOMP/DISCECTOMY FUSION N/A 06/21/2017   Procedure: Revision Anterior Instrumentation Cervical four-seven;  Surgeon: Newman Pies, MD;  Location: [place];  Service: Neurosurgery;  Laterality: N/A;  . APPENDECTOMY    . CATARACT EXTRACTION, BILATERAL    . CERVICAL SPINE SURGERY    . COLONOSCOPY  02/2013  . HEMORRHOID SURGERY  03/2013  . LUMBAR SPINE SURGERY     x 3  . POSTERIOR CERVICAL FUSION/FORAMINOTOMY N/A 06/21/2017   Procedure: POSTERIOR CERVICAL FUSION WITH LATERAL MASS FIXATION CERVICAL FOUR-  CERVICAL FIVE, CERVICAL FIVE- CERVICAL SIX, CERVICAL SIX- CERVICAL SEVEN;  Surgeon: Newman Pies, MD;  Location: [place];  Service: Neurosurgery;  Laterality: N/A;   Past Medical History:  Diagnosis Date  . Anxiety   . Chronic pain syndrome   . COPD (chronic obstructive pulmonary disease) ([place])   . Degenerative disc disease   . Degenerative joint disease   . Depression   . Essential hypertension, benign   . GERD (gastroesophageal reflux disease)   . Hyperlipidemia   . Hypothyroidism   . Insomnia   . Osteoarthritis   . Palpitations   . Restless leg syndrome   . TIA (transient ischemic attack)    BP 118/75   Pulse (!) 57   Ht 5\' 4"  (1.626 m)   Wt 139 lb 9.6 oz (63.3 kg)   SpO2 94%   BMI 23.96 kg/m   Opioid Risk Score:   Fall Risk Score:  `1  Depression screen PHQ 2/9  Depression screen [facility] 2/9 06/10/2018 11/16/2016 12/31/2014  Decreased Interest 0 0 3  Down, Depressed, Hopeless 0 0 0  PHQ - 2 Score 0 0 3  Altered sleeping - - 0  Tired, decreased energy - - 1  Change in appetite - - 0  Feeling bad or failure about yourself  - - 0  Trouble concentrating - - 0  Moving slowly or fidgety/restless - - 0  Suicidal thoughts - - 0  PHQ-9 Score - - 4    Review of Systems  Constitutional: Negative.   HENT: Negative.   Eyes: Negative.   Respiratory: Negative.   Cardiovascular: Negative.   Gastrointestinal: Negative.   Endocrine: Negative.   Genitourinary: Negative.   Musculoskeletal: Negative.   Skin: Negative.   Allergic/Immunologic: Negative.   Neurological: Negative.   Hematological: Negative.   Psychiatric/Behavioral: Negative.   All other systems reviewed and are negative.      Objective:   Physical Exam  Constitutional: She is oriented to person, place, and time. She appears well-developed and well-nourished.  HENT:  Head: Normocephalic and atraumatic.  Eyes: Pupils are equal, round, and reactive to light. EOM are normal.  Neck:  Reduced C spine ROM No  pain to palpation along post cervical muscle   Neurological: She is alert and oriented to person, place, and time. She has normal strength. She displays no tremor. No sensory deficit. She exhibits normal muscle tone.  Reflex Scores:      Tricep reflexes are 3+ on the right side and 3+ on the left side.      Bicep reflexes are 2+ on the right side and 2+ on the left side.      Brachioradialis  reflexes are 2+ on the right side and 2+ on the left side.      Patellar reflexes are 3+ on the right side and 2+ on the left side.      Achilles reflexes are 1+ on the right side and 1+ on the left side. 5/5 strength in BUE and BLE  Skin: Skin is warm and dry.  Psychiatric: She has a normal mood and affect.  Nursing note and vitals reviewed.         Assessment & Plan:  1.  Lumbar post laminectomy syndrome L2-L5 lumbar fusion Pt has chronic low back pain but no evidence of radiculopathy Pt very inconsistent with reports of pain.  She has at times claimed good relief of pain with sacroiliac injections and greater trochanter of hip injections.    She had equivocal relief of groin and hip pain after Intra articular hip injection on R  12/2017  A total of 45 unit of betamethasone injected this year. or 300mg  equivalents of methylprednisolone Goal is < 360mg  per year  Because of hx of mental status change with higher strength narcotic will instead increase tramadol to 70 tabs per month as pt states she does not take hydrocodone on a daily basis  On Cymbalta 30mg  BID but no sign of serotonin syndrome- cont to monitor  Reviewd PMP aware website Will need urine toxicology next vist  Would consider neuropsych eval for mild cognitive deficits- poor historian

## 2018-06-10 NOTE — Patient Instructions (Signed)
Please call if you need another hip or back injection

## 2018-06-13 LAB — TOXASSURE SELECT,+ANTIDEPR,UR

## 2018-06-14 ENCOUNTER — Telehealth: Payer: Self-pay | Admitting: *Deleted

## 2018-06-14 NOTE — Telephone Encounter (Signed)
Urine drug screen for this encounter is consistent for prescribed medication. Hydrocodone last prescribed 03/20/18 by Dr Arnoldo Morale.

## 2018-07-04 ENCOUNTER — Encounter: Payer: Medicare HMO | Attending: Physical Medicine & Rehabilitation

## 2018-07-04 ENCOUNTER — Ambulatory Visit: Payer: Medicare HMO | Admitting: Physical Medicine & Rehabilitation

## 2018-07-04 DIAGNOSIS — Z881 Allergy status to other antibiotic agents status: Secondary | ICD-10-CM | POA: Diagnosis not present

## 2018-07-04 DIAGNOSIS — Z888 Allergy status to other drugs, medicaments and biological substances status: Secondary | ICD-10-CM | POA: Diagnosis not present

## 2018-07-04 DIAGNOSIS — M533 Sacrococcygeal disorders, not elsewhere classified: Secondary | ICD-10-CM | POA: Insufficient documentation

## 2018-07-04 DIAGNOSIS — Z91040 Latex allergy status: Secondary | ICD-10-CM | POA: Insufficient documentation

## 2018-07-04 DIAGNOSIS — M7062 Trochanteric bursitis, left hip: Secondary | ICD-10-CM | POA: Diagnosis not present

## 2018-07-04 DIAGNOSIS — M7061 Trochanteric bursitis, right hip: Secondary | ICD-10-CM

## 2018-07-04 DIAGNOSIS — Z885 Allergy status to narcotic agent status: Secondary | ICD-10-CM | POA: Insufficient documentation

## 2018-07-04 NOTE — Patient Instructions (Signed)

## 2018-07-04 NOTE — Progress Notes (Signed)
Trochanteric bursa injection   Indication Trochanteric bursitis. Exam has tenderness over the greater trochanter of the hip. Pain has not responded to conservative care such as exercise therapy and oral medications. Pain interferes with sleep or with mobility Informed consent was obtained after describing risks and benefits of the procedure with the patient these include bleeding bruising and infection. Patient has signed written consent form. Patient placed in a lateral decubitus position with the affected hip superior. Point of maximal pain was palpated marked and prepped with Betadine and entered with a needle to bone contact. Needle slightly withdrawn then 6mg  of betamethasone with 4 cc 1% lidocaine were injected. Patient tolerated procedure well. Post procedure instructions given. Right side performed first then the left

## 2018-07-27 NOTE — Progress Notes (Signed)
HPI The patient presents for follow up of chest discomfort and palpitations. I saw her previously for this. She's had a stress test in 2015 which was unremarkable.  Since I last saw her she continues to be bothered by chronic back pain and joint pains.  She is had injections.  She is getting a spinal cervical ultrasound.  She is still able to do things like cleaning her own house and cleans house for other people.  She occasionally feels her heart flutter a couple of times per month but at last for only a few seconds.  She is had no presyncope or syncope.  She denies any chest pressure, neck or arm discomfort.  She said no weight gain or edema.  She has no new shortness of breath, PND or orthopnea.  She unfortunately still smokes cigarettes.   Allergies  Allergen Reactions  . Erythromycin Swelling  . Latex Other (See Comments)    Blisters  . Morphine Sulfate Other (See Comments)    Hallucinations/deep sleep  . Clozapine Rash  . Fentanyl Nausea Only    "Feels like you are a hundred miles away when talking to her"    Current Outpatient Medications  Medication Sig Dispense Refill  . alendronate (FOSAMAX) 70 MG tablet Take 70 mg by mouth once a week. Saturday    . aspirin EC 81 MG tablet Take 81 mg by mouth daily.    . budesonide-formoterol (SYMBICORT) 160-4.5 MCG/ACT inhaler Inhale 2 puffs into the lungs daily as needed. 1 Inhaler 3  . calcium carbonate (OS-CAL) 600 MG TABS Take 600 mg by mouth daily.     . cyanocobalamin 500 MCG tablet Take 500 mcg by mouth daily.     Marland Kitchen diltiazem (CARDIZEM) 30 MG tablet TAKE 1 TABLET BY MOUTH AT BEDTIME 90 tablet 1  . docusate sodium (COLACE) 100 MG capsule Take 1 capsule (100 mg total) by mouth 2 (two) times daily. 60 capsule 0  . DULoxetine (CYMBALTA) 30 MG capsule Take 1 capsule (30 mg total) by mouth daily. (Patient taking differently: Take 30 mg by mouth 2 (two) times daily. ) 30 capsule 5  . GAVILYTE-N WITH FLAVOR PACK 420 G solution Take 4,000  mLs by mouth once.     . hydrochlorothiazide 25 MG tablet Take 25 mg by mouth daily.     Marland Kitchen levothyroxine (SYNTHROID, LEVOTHROID) 100 MCG tablet Take 100 mcg by mouth daily before breakfast. In the morning.  1  . Omega-3 Fatty Acids (FISH OIL) 1000 MG CAPS Take 1,000 mg by mouth daily.     . pravastatin (PRAVACHOL) 80 MG tablet Take 1 tablet (80 mg total) by mouth at bedtime. 90 tablet 3  . pregabalin (LYRICA) 50 MG capsule Take 1 capsule (50 mg total) by mouth 2 (two) times daily. 60 capsule 5  . traMADol (ULTRAM) 50 MG tablet Take 1 tablet (50 mg total) by mouth 3 (three) times daily as needed. 70 tablet 5  . traZODone (DESYREL) 150 MG tablet Take 150 mg by mouth at bedtime.   0   No current facility-administered medications for this visit.     Past Medical History:  Diagnosis Date  . Anxiety   . Chronic pain syndrome   . COPD (chronic obstructive pulmonary disease) ([place])   . Degenerative disc disease   . Degenerative joint disease   . Depression   . Essential hypertension, benign   . GERD (gastroesophageal reflux disease)   . Hyperlipidemia   . Hypothyroidism   .  Insomnia   . Osteoarthritis   . Palpitations   . Restless leg syndrome   . TIA (transient ischemic attack)     Past Surgical History:  Procedure Laterality Date  . ABDOMINAL HYSTERECTOMY    . ANTERIOR CERVICAL DECOMP/DISCECTOMY FUSION N/A 05/07/2017   Procedure: ANTERIOR CERVICAL DECOMPRESSION/DISCECTOMY FUSION CERVICAL FOUR- CERVICAL FIVE, CERVICAL FIVE- CERVICAL SIX, CERVICAL SIX- CERVICAL SEVEN;  Surgeon: Newman Pies, MD;  Location: [place];  Service: Neurosurgery;  Laterality: N/A;  . ANTERIOR CERVICAL DECOMP/DISCECTOMY FUSION N/A 06/21/2017   Procedure: Revision Anterior Instrumentation Cervical four-seven;  Surgeon: Newman Pies, MD;  Location: [place];  Service: Neurosurgery;  Laterality: N/A;  . APPENDECTOMY    . CATARACT EXTRACTION, BILATERAL    . CERVICAL SPINE SURGERY    . COLONOSCOPY  02/2013  .  HEMORRHOID SURGERY  03/2013  . LUMBAR SPINE SURGERY     x 3  . POSTERIOR CERVICAL FUSION/FORAMINOTOMY N/A 06/21/2017   Procedure: POSTERIOR CERVICAL FUSION WITH LATERAL MASS FIXATION CERVICAL FOUR- CERVICAL FIVE, CERVICAL FIVE- CERVICAL SIX, CERVICAL SIX- CERVICAL SEVEN;  Surgeon: Newman Pies, MD;  Location: [place];  Service: Neurosurgery;  Laterality: N/A;    ROS:  HOH   Otherwise, as stated in the HPI and negative for all other systems.   PHYSICAL EXAM BP 140/82   Pulse (!) 57   Ht 5\' 4"  (1.626 m)   Wt 137 lb (62.1 kg)   BMI 23.52 kg/m   GENERAL:  Well appearing NECK:  No jugular venous distention, waveform within normal limits, carotid upstroke brisk and symmetric, no bruits, no thyromegaly LUNGS:  Clear to auscultation bilaterally CHEST:  Unremarkable HEART:  PMI not displaced or sustained,S1 and S2 within normal limits, no S3, no S4, no clicks, no rubs, no murmurs ABD:  Flat, positive bowel sounds normal in frequency in pitch, no bruits, no rebound, no guarding, no midline pulsatile mass, no hepatomegaly, no splenomegaly EXT:  2 plus pulses throughout, no edema, no cyanosis no clubbing   EKG: Normal sinus rhythm, rate 57, axis within normal limits, poor anterior R wave progression, no acute ST-T wave changes.     ASSESSMENT AND PLAN  PALPITATIONS:   She can take a as needed Cardizem if she wants but these are not particularly bothering her.  No further evaluation.   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  No change in therapy.  No further imaging.  HTN:  The blood pressure is at target.  No change in therapy.   DYSLIPIDEMIA:  At the last visit she was not at target and her for her LDL and her Pravachol was increased.  79 but her HDL was 86.  She will continue on the meds as listed.  TOBACCO ABUSE:  She has not been able to quit.  She says that some days she could "eat them."  That is a bad sign.

## 2018-07-29 ENCOUNTER — Encounter: Payer: Self-pay | Admitting: Cardiology

## 2018-07-29 ENCOUNTER — Ambulatory Visit: Payer: Medicare HMO | Admitting: Cardiology

## 2018-07-29 VITALS — BP 140/82 | HR 57 | Ht 64.0 in | Wt 137.0 lb

## 2018-07-29 DIAGNOSIS — Z720 Tobacco use: Secondary | ICD-10-CM

## 2018-07-29 DIAGNOSIS — E785 Hyperlipidemia, unspecified: Secondary | ICD-10-CM

## 2018-07-29 DIAGNOSIS — I10 Essential (primary) hypertension: Secondary | ICD-10-CM

## 2018-07-29 DIAGNOSIS — R002 Palpitations: Secondary | ICD-10-CM | POA: Diagnosis not present

## 2018-07-29 NOTE — Patient Instructions (Signed)
Medication Instructions:  Continue current medications  If you need a refill on your cardiac medications before your next appointment, please call your pharmacy.  Labwork: None Ordered   If you have labs (blood work) drawn today and your tests are completely normal, you will receive your results only by: Marland Kitchen MyChart Message (if you have MyChart) OR . A paper copy in the mail If you have any lab test that is abnormal or we need to change your treatment, we will call you to review the results.  Testing/Procedures: None Ordered  Follow-Up: You will need a follow up appointment in 1 Year.  Please call our office 2 months in advance([phone]) to schedule the appointment.  You may see  DR Percival Spanish or one of the following Advanced Practice Providers on your designated Care Team:   . Rosaria Ferries, PA-C . Jory Sims, DNP, ANP  At [facility], you and your health needs are our priority.  As part of our continuing mission to provide you with exceptional heart care, we have created designated Provider Care Teams.  These Care Teams include your primary Cardiologist (physician) and Advanced Practice Providers (APPs -  Physician Assistants and Nurse Practitioners) who all work together to provide you with the care you need, when you need it.    Thank you for choosing [facility] at [facility]!!

## 2018-08-01 ENCOUNTER — Other Ambulatory Visit: Payer: Self-pay | Admitting: Neurosurgery

## 2018-08-01 ENCOUNTER — Other Ambulatory Visit: Payer: Self-pay | Admitting: Physical Medicine & Rehabilitation

## 2018-08-01 ENCOUNTER — Telehealth: Payer: Self-pay | Admitting: Nurse Practitioner

## 2018-08-01 DIAGNOSIS — M542 Cervicalgia: Secondary | ICD-10-CM

## 2018-08-01 NOTE — Telephone Encounter (Signed)
Phone call to patient to verify medication list and allergies for myelogram procedure. Pt instructed to hold cymbalta, trazodone and tramadol 48hrs prior to myelogram appointment time. Pt verbalized understanding.

## 2018-08-05 ENCOUNTER — Other Ambulatory Visit: Payer: Self-pay | Admitting: *Deleted

## 2018-08-05 ENCOUNTER — Other Ambulatory Visit: Payer: Self-pay | Admitting: Physical Medicine & Rehabilitation

## 2018-08-06 ENCOUNTER — Other Ambulatory Visit: Payer: Self-pay | Admitting: Cardiology

## 2018-08-06 NOTE — Telephone Encounter (Signed)
Rx request sent to pharmacy.  

## 2018-08-13 ENCOUNTER — Encounter: Payer: Medicare HMO | Attending: Physical Medicine & Rehabilitation

## 2018-08-13 ENCOUNTER — Ambulatory Visit: Payer: Medicare HMO | Admitting: Physical Medicine & Rehabilitation

## 2018-08-13 ENCOUNTER — Encounter: Payer: Self-pay | Admitting: Physical Medicine & Rehabilitation

## 2018-08-13 VITALS — BP 143/82 | HR 60 | Ht 64.0 in | Wt 140.0 lb

## 2018-08-13 DIAGNOSIS — Z91040 Latex allergy status: Secondary | ICD-10-CM | POA: Diagnosis not present

## 2018-08-13 DIAGNOSIS — Z881 Allergy status to other antibiotic agents status: Secondary | ICD-10-CM | POA: Insufficient documentation

## 2018-08-13 DIAGNOSIS — M7061 Trochanteric bursitis, right hip: Secondary | ICD-10-CM

## 2018-08-13 DIAGNOSIS — Z885 Allergy status to narcotic agent status: Secondary | ICD-10-CM | POA: Insufficient documentation

## 2018-08-13 DIAGNOSIS — Z888 Allergy status to other drugs, medicaments and biological substances status: Secondary | ICD-10-CM | POA: Insufficient documentation

## 2018-08-13 DIAGNOSIS — M7062 Trochanteric bursitis, left hip: Principal | ICD-10-CM

## 2018-08-13 DIAGNOSIS — M533 Sacrococcygeal disorders, not elsewhere classified: Secondary | ICD-10-CM | POA: Insufficient documentation

## 2018-08-13 MED ORDER — LIDOCAINE 5 % EX PTCH: 2.0000 | MEDICATED_PATCH | CUTANEOUS | 2 refills | Status: AC

## 2018-08-13 MED ORDER — DICLOFENAC EPOLAMINE 1.3 % TD PTCH: 1.0000 | MEDICATED_PATCH | Freq: Two times a day (BID) | TRANSDERMAL | 2 refills | Status: DC

## 2018-08-13 NOTE — Progress Notes (Signed)
Subjective:    Patient ID: Emily Leonard, female    DOB: 06-28-1946, 72 y.o.   MRN: [id number]  HPI 72 year old patient with chronic hip pain.  She states the pain is mainly lateral.  She also has had a prior history of sacroiliac discomfort with buttock pain and back pain but this is no longer bothering her at this time.  She also has a history of cervical postlaminectomy syndrome and is status post posterior cervical fusion.  She is scheduled for CT myelogram of the cervical spine area.  We discussed that rheumatologist did not find evidence room rheumatoid arthritis.  She still is under the impression that she does have this.  We discussed that her type of arthritis is osteoarthritis.  She is on Celebrex prescribed by her rheumatologist.  Pain Inventory Average Pain 9 Pain Right Now 9 My pain is sharp  In the last 24 hours, has pain interfered with the following? General activity 7 Relation with others 10 Enjoyment of life 10 What TIME of day is your pain at its worst? all Sleep (in general) Fair  Pain is worse with: na Pain improves with: medication Relief from Meds: 6  Mobility walk without assistance ability to climb steps?  yes do you drive?  yes  Function retired  Neuro/Psych No problems in this area  Prior Studies Any changes since last visit?  no  Physicians involved in your care Any changes since last visit?  no   Family History  Problem Relation Age of Onset  . Hypertension Mother   . Hypertension Father   . Heart attack Father 57  . Heart attack Brother 53   Social History   Socioeconomic History  . Marital status: Married    Spouse name: Not on file  . Number of children: 4  . Years of education: Not on file  . Highest education level: Not on file  Occupational History  . Not on file  Social Needs  . Financial resource strain: Not on file  . Food insecurity:    Worry: Not on file    Inability: Not on file  . Transportation needs:   Medical: Not on file    Non-medical: Not on file  Tobacco Use  . Smoking status: Current Every Day Smoker    Packs/day: 0.75    Years: 30.00    Pack years: 22.50    Types: Cigarettes  . Smokeless tobacco: Never Used  Substance and Sexual Activity  . Alcohol use: Yes    Alcohol/week: 1.0 standard drinks    Types: 1 Standard drinks or equivalent per week    Comment: rarely  . Drug use: No  . Sexual activity: Not on file  Lifestyle  . Physical activity:    Days per week: Not on file    Minutes per session: Not on file  . Stress: Not on file  Relationships  . Social connections:    Talks on phone: Not on file    Gets together: Not on file    Attends religious service: Not on file    Active member of club or organization: Not on file    Attends meetings of clubs or organizations: Not on file    Relationship status: Not on file  Other Topics Concern  . Not on file  Social History Narrative  . Not on file   Past Surgical History:  Procedure Laterality Date  . ABDOMINAL HYSTERECTOMY    . ANTERIOR CERVICAL DECOMP/DISCECTOMY FUSION N/A 05/07/2017  Procedure: ANTERIOR CERVICAL DECOMPRESSION/DISCECTOMY FUSION CERVICAL FOUR- CERVICAL FIVE, CERVICAL FIVE- CERVICAL SIX, CERVICAL SIX- CERVICAL SEVEN;  Surgeon: Newman Pies, MD;  Location: [place];  Service: Neurosurgery;  Laterality: N/A;  . ANTERIOR CERVICAL DECOMP/DISCECTOMY FUSION N/A 06/21/2017   Procedure: Revision Anterior Instrumentation Cervical four-seven;  Surgeon: Newman Pies, MD;  Location: [place];  Service: Neurosurgery;  Laterality: N/A;  . APPENDECTOMY    . CATARACT EXTRACTION, BILATERAL    . CERVICAL SPINE SURGERY    . COLONOSCOPY  02/2013  . HEMORRHOID SURGERY  03/2013  . LUMBAR SPINE SURGERY     x 3  . POSTERIOR CERVICAL FUSION/FORAMINOTOMY N/A 06/21/2017   Procedure: POSTERIOR CERVICAL FUSION WITH LATERAL MASS FIXATION CERVICAL FOUR- CERVICAL FIVE, CERVICAL FIVE- CERVICAL SIX, CERVICAL SIX- CERVICAL SEVEN;   Surgeon: Newman Pies, MD;  Location: [place];  Service: Neurosurgery;  Laterality: N/A;   Past Medical History:  Diagnosis Date  . Anxiety   . Chronic pain syndrome   . COPD (chronic obstructive pulmonary disease) ([place])   . Degenerative disc disease   . Degenerative joint disease   . Depression   . Essential hypertension, benign   . GERD (gastroesophageal reflux disease)   . Hyperlipidemia   . Hypothyroidism   . Insomnia   . Osteoarthritis   . Palpitations   . Restless leg syndrome   . TIA (transient ischemic attack)    BP (!) 143/82   Pulse 60   Ht 5\' 4"  (1.626 m)   Wt 140 lb (63.5 kg)   SpO2 96%   BMI 24.03 kg/m   Opioid Risk Score:   Fall Risk Score:  `1  Depression screen PHQ 2/9  Depression screen [facility] 2/9 06/10/2018 11/16/2016 12/31/2014  Decreased Interest 0 0 3  Down, Depressed, Hopeless 0 0 0  PHQ - 2 Score 0 0 3  Altered sleeping - - 0  Tired, decreased energy - - 1  Change in appetite - - 0  Feeling bad or failure about yourself  - - 0  Trouble concentrating - - 0  Moving slowly or fidgety/restless - - 0  Suicidal thoughts - - 0  PHQ-9 Score - - 4     Review of Systems  Constitutional: Negative.   HENT: Negative.   Eyes: Negative.   Respiratory: Negative.   Cardiovascular: Negative.   Gastrointestinal: Negative.   Endocrine: Negative.   Genitourinary: Negative.   Musculoskeletal: Positive for arthralgias, joint swelling and myalgias.  Skin: Negative.   Allergic/Immunologic: Negative.   Neurological: Negative.   Hematological: Negative.   Psychiatric/Behavioral: Negative.   All other systems reviewed and are negative.      Objective:   Physical Exam  Constitutional: She is oriented to person, place, and time. She appears well-developed and well-nourished. No distress.  HENT:  Head: Normocephalic and atraumatic.  Eyes: Pupils are equal, round, and reactive to light. EOM are normal.  Neurological: She is alert and oriented to person,  place, and time.  Skin: Skin is dry. She is not diaphoretic.  Nursing note and vitals reviewed.  Tenderness palpation over the lateral aspect of the hips.  This is over the greater trochanter. No tenderness over the PSIS.  No tenderness over the buttocks area.  She has good lumbar range of motion.  However most of her lumbar range occurs from her hip area. Motor strength is normal in the lower extremities.  Gait is without assistive device no evidence of toe drag or knee instability.  Assessment & Plan:  #1.  Chronic hip pain chronic trochanteric bursitis.  She has not been very consistent with her exercises and stretching.  She gets short-term relief with her trochanteric bursa injection under fluoroscopic guidance.  We may try targeting more of the posterior aspect versus the anterior aspect of the greater trochanter to see whether we can maximize effectiveness. In terms of her chronic pain she is tolerating her tramadol 50 mill grams 3 times daily she is also on Cymbalta therefore do not want to increase the tramadol dosing. In terms of her hips I think she would benefit from lidocaine patch 5% on 12 hours, off 12 hours

## 2018-08-13 NOTE — Patient Instructions (Signed)
If Insurance doesn't pay for prescription patch , please buy non prescription Lidocaine patch.  You should ask your pharmacist to help

## 2018-08-13 NOTE — Progress Notes (Deleted)
  PROCEDURE RECORD [facility]   Name: Emily Leonard DOB:29-Aug-1946 MRN: [id number]  Date:08/13/2018  Physician: Alysia Penna, MD    Nurse/CMA: Danylah Holden CMA  Allergies:  Allergies  Allergen Reactions  . Erythromycin Swelling  . Latex Other (See Comments)    Blisters  . Morphine Sulfate Other (See Comments)    Hallucinations/deep sleep  . Clozapine Rash  . Fentanyl Nausea Only    "Feels like you are a hundred miles away when talking to her"    Consent Signed: Yes.    Is patient diabetic? No.  CBG today? NA  Pregnant: No. LMP: No LMP recorded. Patient has had a hysterectomy. (age 21-55)  Anticoagulants: yes (ASA, yesterday morning) Anti-inflammatory: yes (OTC anti-inflamitory last night) Antibiotics: no  Procedure: Bilateral Sacroiliac injection vs Bilateral Trochanteric hip injection with ultra sound  Position: lateral decubitus position, Right side then Left side  Start Time:  End Time:  Fluoro Time:    RN/CMA Dakarri Kessinger CMA Aldwin Micalizzi CMA    Time 10:59am     BP 143/82     Pulse 60     Respirations 16 16    O2 Sat 96     S/S 6 6    Pain Level 9/10 /10     D/C home with Husband, [name], D/C instructions reviewed, and sits independently.

## 2018-08-15 ENCOUNTER — Ambulatory Visit
Admission: RE | Admit: 2018-08-15 | Discharge: 2018-08-15 | Disposition: A | Discharge status: Home / Self Care | Payer: Medicare HMO | Source: Ambulatory Visit | Attending: Neurosurgery | Admitting: Neurosurgery

## 2018-08-15 VITALS — BP 118/65 | HR 51

## 2018-08-15 DIAGNOSIS — M542 Cervicalgia: Secondary | ICD-10-CM

## 2018-08-15 DIAGNOSIS — M4722 Other spondylosis with radiculopathy, cervical region: Secondary | ICD-10-CM

## 2018-08-15 MED ORDER — DIAZEPAM 5 MG PO TABS: 5.0000 mg | ORAL_TABLET | Freq: Once | ORAL | Status: DC

## 2018-08-15 MED ORDER — ONDANSETRON HCL 4 MG/2ML IJ SOLN: 4.0000 mg | Freq: Four times a day (QID) | INTRAMUSCULAR | Status: DC | PRN

## 2018-08-15 MED ORDER — IOPAMIDOL (ISOVUE-M 300) INJECTION 61%
10.0000 mL | Freq: Once | INTRAMUSCULAR | Status: AC | PRN
  Administered 2018-08-15: 10 mL via INTRATHECAL

## 2018-08-15 NOTE — Discharge Instructions (Signed)
Myelogram Discharge Instructions  1. Go home and rest quietly for the next 24 hours.  It is important to lie flat for the next 24 hours.  Get up only to go to the restroom.  You may lie in the bed or on a couch on your back, your stomach, your left side or your right side.  You may have one pillow under your head.  You may have pillows between your knees while you are on your side or under your knees while you are on your back.  2. DO NOT drive today.  Recline the seat as far back as it will go, while still wearing your seat belt, on the way home.  3. You may get up to go to the bathroom as needed.  You may sit up for 10 minutes to eat.  You may resume your normal diet and medications unless otherwise indicated.  Drink plenty of extra fluids today and tomorrow.  4. The incidence of a spinal headache with nausea and/or vomiting is about 5% (one in 20 patients).  If you develop a headache, lie flat and drink plenty of fluids until the headache goes away.  Caffeinated beverages may be helpful.  If you develop severe nausea and vomiting or a headache that does not go away with flat bed rest, call [phone].  5. You may resume normal activities after your 24 hours of bed rest is over; however, do not exert yourself strongly or do any heavy lifting tomorrow.  6. Call your physician for a follow-up appointment.    You may resume Cymbalta, Trazodone and Tramadol on Friday, August 16, 2018 after 10:30am.

## 2018-08-15 NOTE — Progress Notes (Signed)
Patient states she has been off Cymbalta, Tramadol and Trazodone for at least the past two days.

## 2018-09-02 ENCOUNTER — Telehealth: Payer: Self-pay | Admitting: Physical Medicine & Rehabilitation

## 2018-09-02 NOTE — Telephone Encounter (Signed)
Please get office note from Dr Arnoldo Morale to clarify

## 2018-09-02 NOTE — Telephone Encounter (Signed)
Patients other doctor jeffrey jenkins next door at [facility] states patient needs injection in shoulder based on myelogram?  She wants to know if you will based on phone call? Can you see their notes

## 2018-09-06 ENCOUNTER — Ambulatory Visit: Payer: Medicare HMO | Admitting: Physical Medicine & Rehabilitation

## 2018-09-06 ENCOUNTER — Encounter: Payer: Medicare HMO | Attending: Physical Medicine & Rehabilitation

## 2018-09-06 ENCOUNTER — Encounter: Payer: Self-pay | Admitting: Physical Medicine & Rehabilitation

## 2018-09-06 VITALS — BP 126/73 | HR 60 | Ht 64.0 in | Wt 136.4 lb

## 2018-09-06 DIAGNOSIS — Z91040 Latex allergy status: Secondary | ICD-10-CM | POA: Insufficient documentation

## 2018-09-06 DIAGNOSIS — M533 Sacrococcygeal disorders, not elsewhere classified: Secondary | ICD-10-CM | POA: Diagnosis present

## 2018-09-06 DIAGNOSIS — Z888 Allergy status to other drugs, medicaments and biological substances status: Secondary | ICD-10-CM | POA: Diagnosis not present

## 2018-09-06 DIAGNOSIS — Z885 Allergy status to narcotic agent status: Secondary | ICD-10-CM | POA: Diagnosis not present

## 2018-09-06 DIAGNOSIS — Z881 Allergy status to other antibiotic agents status: Secondary | ICD-10-CM | POA: Diagnosis not present

## 2018-09-06 DIAGNOSIS — M7918 Myalgia, other site: Secondary | ICD-10-CM

## 2018-09-06 NOTE — Progress Notes (Signed)
Trigger Point Injection  Indication: Left  Myofascial pain not relieved by medication management and other conservative care.  Informed consent was obtained after describing risk and benefits of the procedure with the patient, this includes bleeding, bruising, infection and medication side effects.  The patient wishes to proceed and has given written consent.  The patient was placed in a Seated position.  The Left levator scapula x 2, Left infraspinatus area was marked and prepped with Betadine.  It was entered with a 25-gauge 1-1/2 inch needle and 1 mL of 1% lidocaine was injected into each of 3 trigger points, after negative draw back for blood.  The patient tolerated the procedure well.  Post procedure instructions were given.

## 2018-09-06 NOTE — Patient Instructions (Signed)
Trigger Point Injection  Trigger points are areas where you have pain. A trigger point injection is a shot given in the trigger point to help relieve pain for a few days to a few months. Common places for trigger points include:  · The neck.  · The shoulders.  · The upper back.  · The lower back.    A trigger point injection will not cure long-lasting (chronic) pain permanently. These injections do not always work for every person, but for some people they can help to relieve pain for a few days to a few months.  Tell a health care provider about:  · Any allergies you have.  · All medicines you are taking, including vitamins, herbs, eye drops, creams, and over-the-counter medicines.  · Any problems you or family members have had with anesthetic medicines.  · Any blood disorders you have.  · Any surgeries you have had.  · Any medical conditions you have.  What are the risks?  Generally, this is a safe procedure. However, problems may occur, including:  · Infection.  · Bleeding.  · Allergic reaction to the injected medicine.  · Irritation of the skin around the injection site.    What happens before the procedure?  · Ask your health care provider about changing or stopping your regular medicines. This is especially important if you are taking diabetes medicines or blood thinners.  What happens during the procedure?  · Your health care provider will feel for trigger points. A marker may be used to circle the area for the injection.  · The skin over the trigger point will be washed with a germ-killing (antiseptic) solution.  · A thin needle is used for the shot. You may feel pain or a twitching feeling when the needle enters the trigger point.  · A numbing solution may be injected into the trigger point. Sometimes a medicine to keep down swelling, redness, and warmth (inflammation) is also injected.  · Your health care provider may move the needle around the area where the trigger point is located until the tightness  and twitching goes away.  · After the injection, your health care provider may put gentle pressure over the injection site.  · The injection site will be covered with a bandage (dressing).  The procedure may vary among health care providers and hospitals.  What happens after the procedure?  · The dressing can be taken off in a few hours or as told by your health care provider.  · You may feel sore and stiff for 1-2 days.  This information is not intended to replace advice given to you by your health care provider. Make sure you discuss any questions you have with your health care provider.  Document Released: 09/21/2011 Document Revised: 06/04/2016 Document Reviewed: 03/22/2015  Elsevier Interactive Patient Education © 2018 Elsevier Inc.

## 2018-11-07 ENCOUNTER — Ambulatory Visit: Payer: Medicare HMO | Admitting: Physical Medicine & Rehabilitation

## 2018-11-07 ENCOUNTER — Encounter: Payer: Self-pay | Admitting: Physical Medicine & Rehabilitation

## 2018-11-07 ENCOUNTER — Encounter: Payer: Medicare HMO | Attending: Physical Medicine & Rehabilitation

## 2018-11-07 VITALS — BP 138/81 | HR 58 | Ht 64.0 in | Wt 139.0 lb

## 2018-11-07 DIAGNOSIS — Z5181 Encounter for therapeutic drug level monitoring: Secondary | ICD-10-CM

## 2018-11-07 DIAGNOSIS — G894 Chronic pain syndrome: Secondary | ICD-10-CM | POA: Diagnosis not present

## 2018-11-07 DIAGNOSIS — Z881 Allergy status to other antibiotic agents status: Secondary | ICD-10-CM | POA: Insufficient documentation

## 2018-11-07 DIAGNOSIS — M545 Low back pain: Secondary | ICD-10-CM | POA: Insufficient documentation

## 2018-11-07 DIAGNOSIS — Z888 Allergy status to other drugs, medicaments and biological substances status: Secondary | ICD-10-CM | POA: Diagnosis not present

## 2018-11-07 DIAGNOSIS — M533 Sacrococcygeal disorders, not elsewhere classified: Secondary | ICD-10-CM

## 2018-11-07 DIAGNOSIS — Z885 Allergy status to narcotic agent status: Secondary | ICD-10-CM | POA: Insufficient documentation

## 2018-11-07 DIAGNOSIS — Z91040 Latex allergy status: Secondary | ICD-10-CM | POA: Insufficient documentation

## 2018-11-07 DIAGNOSIS — Z79891 Long term (current) use of opiate analgesic: Secondary | ICD-10-CM

## 2018-11-07 MED ORDER — PREGABALIN 50 MG PO CAPS: 50.0000 mg | ORAL_CAPSULE | Freq: Two times a day (BID) | ORAL | 5 refills | Status: DC

## 2018-11-07 MED ORDER — TRAMADOL HCL 50 MG PO TABS: 50.0000 mg | ORAL_TABLET | Freq: Three times a day (TID) | ORAL | 5 refills | Status: DC | PRN

## 2018-11-07 NOTE — Progress Notes (Signed)

## 2018-11-07 NOTE — Patient Instructions (Addendum)
Sacroiliac injection was performed today. A combination of a naming medicine plus a cortisone medicine was injected. The injection was done under x-ray guidance. This procedure has been performed to help reduce low back and buttocks pain as well as potentially hip pain. The duration of this injection is variable lasting from hours to  Months. It may repeated if needed.  Please call if you need another injection in 3 months

## 2018-11-07 NOTE — Progress Notes (Signed)
  PROCEDURE RECORD [facility]   Name: SHAY JHAVERI DOB:[birth year] MRN: [id number]  Date:11/07/2018  Physician: Alysia Penna, MD    Nurse/CMA: Sadat Sliwa CMA  Allergies:  Allergies  Allergen Reactions  . Erythromycin Swelling  . Latex Other (See Comments)    Blisters  . Morphine Sulfate Other (See Comments)    Hallucinations/deep sleep  . Clozapine Rash  . Fentanyl Nausea Only    "Feels like you are a hundred miles away when talking to her"    Consent Signed: Yes.    Is patient diabetic? No.  CBG today? NA  Pregnant: No. LMP: No LMP recorded. Patient has had a hysterectomy. (age [age])  Anticoagulants: no Anti-inflammatory: no Antibiotics: no  Procedure: Bilateral SI injections Position: Prone   Start Time: 1058am End Time: 1104am Fluoro Time: 21s  RN/CMA Wessling CMA Roman Sandall CMA    Time 938am 1110am    BP 138/81 135/77    Pulse 58 60    Respirations 16 16    O2 Sat 93 93    S/S 6 6    Pain Level 10/10 7/10     D/C home with husband, [name], D/C instructions reviewed, and sits independently.

## 2018-11-13 LAB — TOXASSURE SELECT,+ANTIDEPR,UR

## 2018-11-14 ENCOUNTER — Telehealth: Payer: Self-pay | Admitting: *Deleted

## 2018-11-14 NOTE — Telephone Encounter (Signed)
Urine drug screen for this encounter is consistent for prescribed medication 

## 2018-12-05 ENCOUNTER — Other Ambulatory Visit: Payer: Self-pay | Admitting: Cardiology

## 2018-12-10 ENCOUNTER — Ambulatory Visit: Payer: Medicare HMO | Admitting: Registered Nurse

## 2019-01-07 ENCOUNTER — Ambulatory Visit: Payer: Medicare HMO | Admitting: Physical Medicine & Rehabilitation

## 2019-02-11 ENCOUNTER — Encounter: Payer: Self-pay | Admitting: Physical Medicine & Rehabilitation

## 2019-02-11 ENCOUNTER — Ambulatory Visit: Payer: Medicare HMO | Admitting: Physical Medicine & Rehabilitation

## 2019-02-11 ENCOUNTER — Encounter: Payer: Medicare HMO | Attending: Physical Medicine & Rehabilitation

## 2019-02-11 ENCOUNTER — Other Ambulatory Visit: Payer: Self-pay

## 2019-02-11 VITALS — BP 129/82 | HR 60 | Temp 98.1°F | Resp 16 | Ht 64.0 in | Wt 141.0 lb

## 2019-02-11 DIAGNOSIS — M7062 Trochanteric bursitis, left hip: Secondary | ICD-10-CM

## 2019-02-11 DIAGNOSIS — M7061 Trochanteric bursitis, right hip: Secondary | ICD-10-CM

## 2019-02-11 NOTE — Progress Notes (Signed)
Trochanteric bursa injection-Bilateral  With ultrasound guidance  Indication Trochanteric bursitis. Exam has tenderness over the greater trochanter of the hip. Pain has not responded to conservative care such as exercise therapy and oral medications. Pain interferes with sleep or with mobility Covid complication  risk 3 moderate Informed consent was obtained after describing risks and benefits of the procedure with the patient these include bleeding bruising and infection. Patient has signed written consent form. Patient placed in a lateral decubitus position with the affected hip superior. Point of maximal pain was palpated marked and prepped with Betadine and entered with a needle to bone contact. Needle slightly withdrawn then 3mg  of betamethasone with 2 cc 1% lidocaine were injected.RIght side injected then the left Patient tolerated procedure well. Post procedure instructions given.

## 2019-02-11 NOTE — Patient Instructions (Signed)
Will do sacroiliac injection after insurance approval next visit

## 2019-03-18 ENCOUNTER — Encounter: Payer: Medicare HMO | Admitting: Physical Medicine & Rehabilitation

## 2019-03-18 ENCOUNTER — Telehealth: Payer: Self-pay | Admitting: Physical Medicine & Rehabilitation

## 2019-03-18 DIAGNOSIS — G8929 Other chronic pain: Secondary | ICD-10-CM

## 2019-03-18 DIAGNOSIS — M7061 Trochanteric bursitis, right hip: Secondary | ICD-10-CM

## 2019-03-18 DIAGNOSIS — M7918 Myalgia, other site: Secondary | ICD-10-CM

## 2019-03-18 DIAGNOSIS — M25552 Pain in left hip: Secondary | ICD-10-CM

## 2019-03-18 DIAGNOSIS — M533 Sacrococcygeal disorders, not elsewhere classified: Secondary | ICD-10-CM

## 2019-03-18 NOTE — Telephone Encounter (Signed)
If PT is required plese place order for it to be done in [place] at [facility]  Dx chronic sacroiliac pain

## 2019-03-18 NOTE — Telephone Encounter (Signed)
Per [employer] cannot approve they will send fax but state she does not appear to be receiving all aspects of a pain mngt program including PT-they will fax response.

## 2019-03-19 NOTE — Telephone Encounter (Signed)
Order placed for PT @ [facility].

## 2019-03-21 NOTE — Telephone Encounter (Signed)
I have spoken with Emily Leonard.  She is inquiring what the injections would cost without insurance approval. I have transferred her to April.

## 2019-03-21 NOTE — Telephone Encounter (Signed)
Emily Leonard called and is upset about the PT order saying she does not need PT she needs her injection and if he can't do it tell her so someone else can. It was her insurance requiring it.

## 2019-05-07 ENCOUNTER — Telehealth: Payer: Self-pay | Admitting: Physical Medicine & Rehabilitation

## 2019-05-07 NOTE — Telephone Encounter (Signed)
Recd call from [employer] in [place] - trying to help  Emily Leonard get her injection - she states she called for PT Records and they state we will have to make the request in person.  Faxing req tp [place] [phone] to req records to try for new prior auth

## 2019-05-12 ENCOUNTER — Telehealth: Payer: Self-pay | Admitting: Physical Medicine & Rehabilitation

## 2019-05-12 NOTE — Telephone Encounter (Signed)
I did review the [facility] health physical therapy notes.  There was no documented improvement of pain for the physical therapy course between 04/15/2019 and 04/29/2019 therefore she has failed conservative care and would benefit from repeat injections in bilateral sacroiliac joints

## 2019-05-12 NOTE — Telephone Encounter (Signed)
MD review all PT notes from [facility] health - per review no improvement in pain for 06-30--0714 20

## 2019-06-03 ENCOUNTER — Other Ambulatory Visit: Payer: Self-pay | Admitting: Physical Medicine & Rehabilitation

## 2019-06-06 ENCOUNTER — Other Ambulatory Visit: Payer: Self-pay

## 2019-06-06 MED ORDER — PREGABALIN 50 MG PO CAPS: 50.0000 mg | ORAL_CAPSULE | Freq: Two times a day (BID) | ORAL | 5 refills | Status: DC

## 2019-06-06 NOTE — Telephone Encounter (Signed)
Recieved message from [facility] stating patients prescription for lyrica was expired.  Spoke with Dr. Letta Pate who said she may continue medication.  Called pharmacy and renewed prescription with 5 refills.

## 2019-06-09 IMAGING — CT CT CERVICAL SPINE W/ CM
2 series · 10 of 14 positions shown, 12 images · non-contrast
Comparison: Cervical spine MRI 03/13/2017

CLINICAL DATA: Neck and left greater than right shoulder pain.
Prior cervical fusion.
TECHNIQUE: Contiguous axial images were obtained through the Cervical spine
after the intrathecal infusion of infusion. Coronal and sagittal
reconstructions were obtained of the axial image sets.

[Series 3: cspine soft (person_name) · axial · 0.24mm/px · z∈[-204,-94]mm · 5 of 83 slices shown]
[im 14/83  soft-tissue]
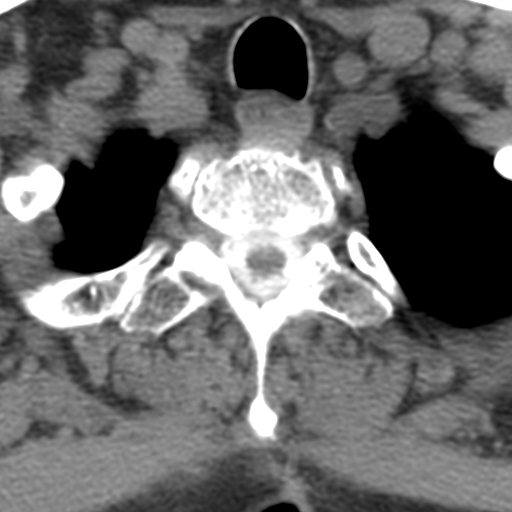
[im 28/83  soft-tissue]
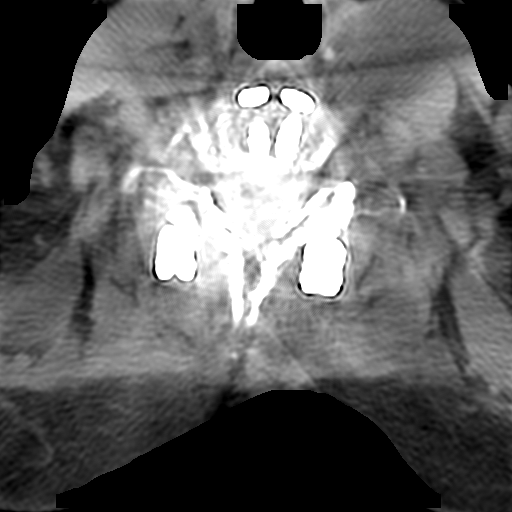
[im 42/83  soft-tissue]
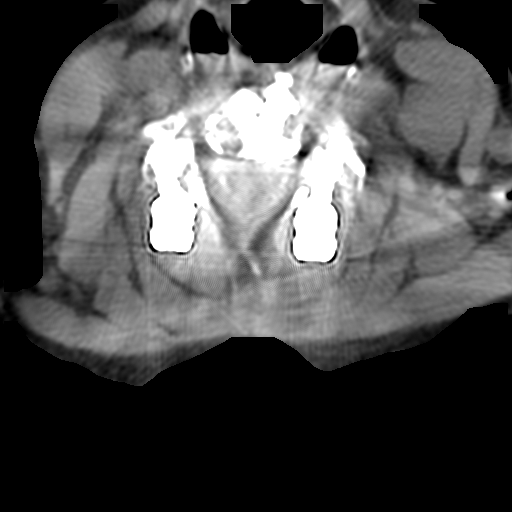
[im 55/83  soft-tissue]
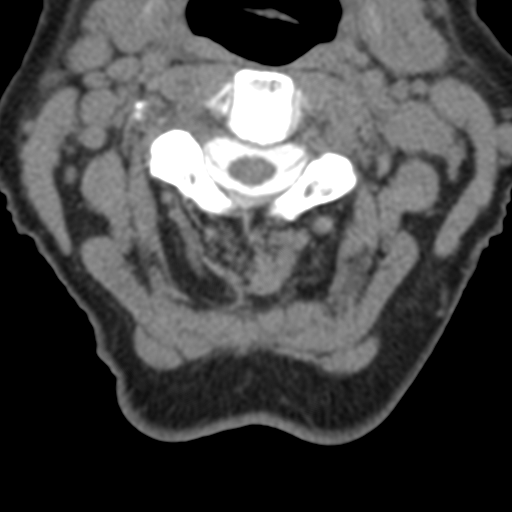
[im 69/83  soft-tissue]
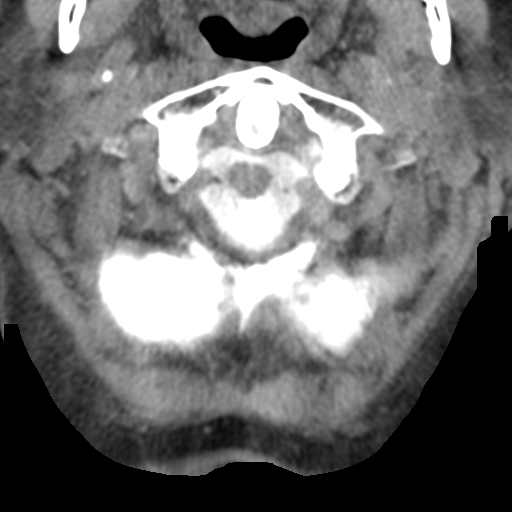

[Series 9: angled axial · axial · 0.24mm/px · z∈[-214,-104]mm · 5 of 84 slices shown, 7 images]
[im 14/84  soft-tissue]
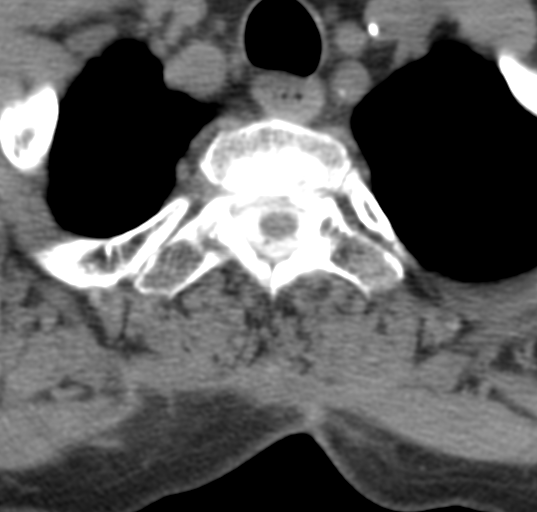
[im 14/84  bone]
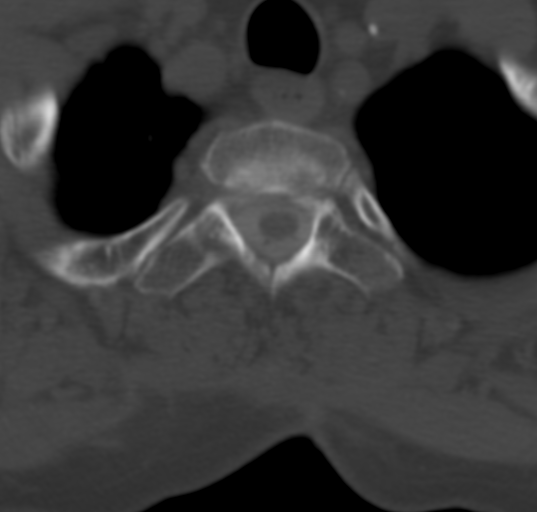
[im 28/84  bone]
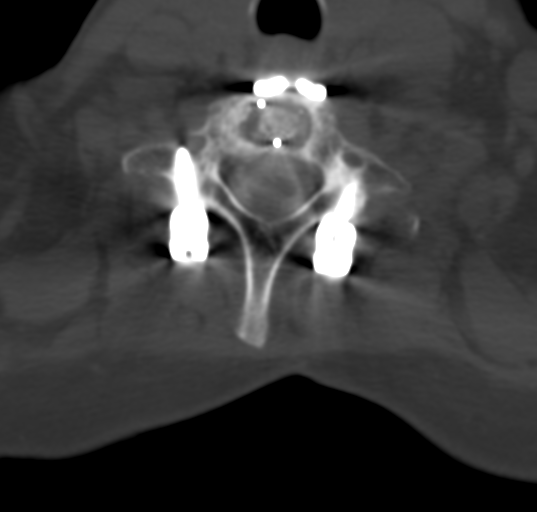
[im 42/84  bone]
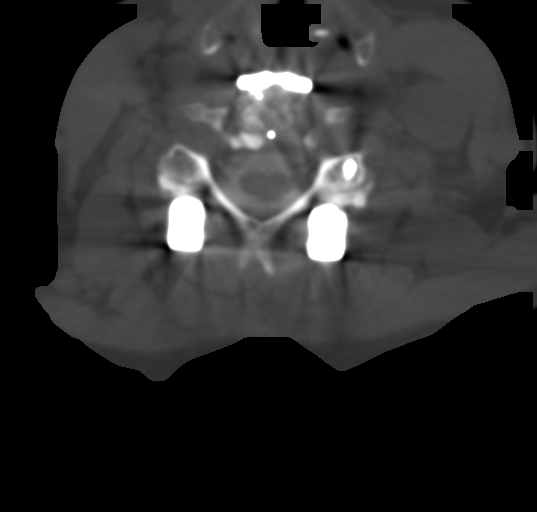
[im 56/84  bone]
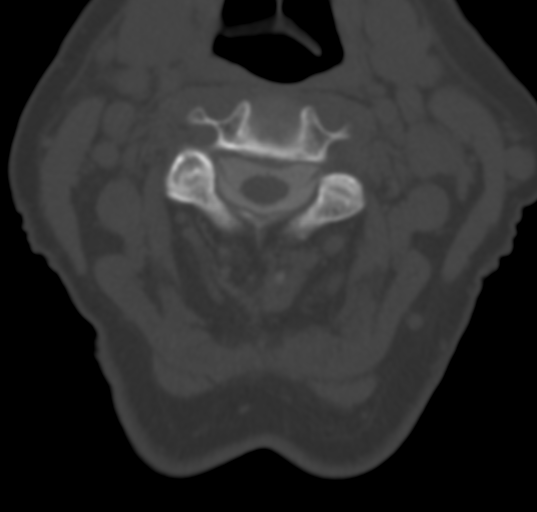
[im 70/84  soft-tissue]
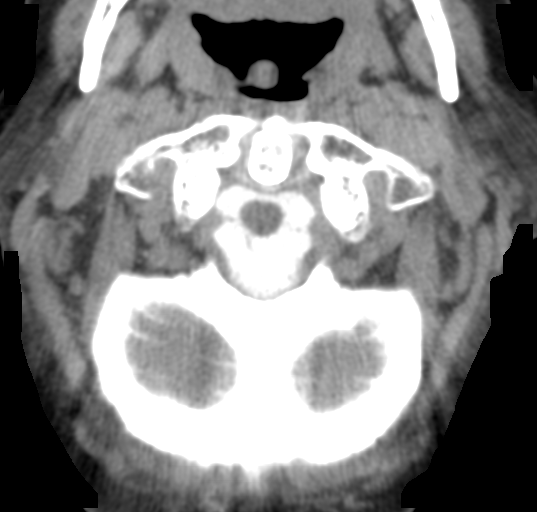
[im 70/84  bone]
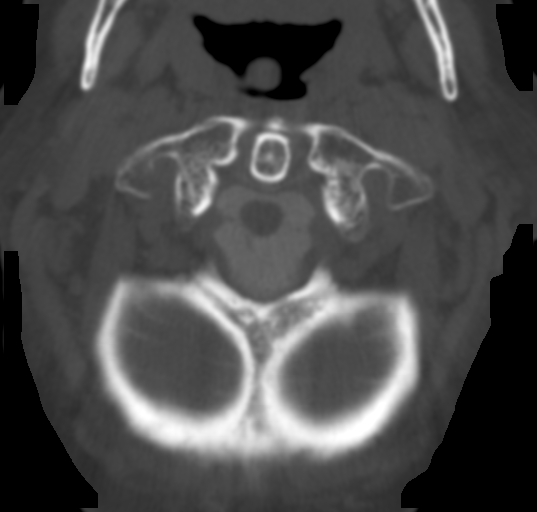

[10 of 14 positions shown; findings below may reference images not displayed]

FLUOROSCOPY TIME:  Radiation Exposure Index (as provided by the
fluoroscopic device): 107.74 microGray*m^2

Fluoroscopy Time (in minutes and seconds):  35 seconds

PROCEDURE:
LUMBAR PUNCTURE FOR CERVICAL MYELOGRAM

After thorough discussion of risks and benefits of the procedure
including bleeding, infection, injury to nerves, blood vessels,
adjacent structures as well as headache and CSF leak, written and
oral informed consent was obtained. Consent was obtained by Dr.
Savio Locklear. We discussed the high likelihood of obtaining a
diagnostic study.

Patient was positioned prone on the fluoroscopy table. Local
anesthesia was provided with 1% lidocaine without epinephrine after
prepped and draped in the usual sterile fashion. Puncture was
performed at L2-3 using a 3 1/2 inch 22-gauge spinal needle via a
midline approach. Using a single pass through the dura, the needle
was placed within the thecal sac, with return of clear CSF. 10 mL of
Isovue 8-UZZ was injected into the thecal sac, with normal
opacification of the nerve roots and cauda equina consistent with
free flow within the subarachnoid space. The patient was then moved
to the trendelenburg position and contrast flowed into the Cervical
spine region.

I personally performed the lumbar puncture and administered the
intrathecal contrast. I also personally supervised acquisition of
the myelogram images.
FINDINGS: CERVICAL MYELOGRAM FINDINGS:

There is grade 1 anterolisthesis of C7 on T1 which increases with
flexion. Sequelae of C4-C7 ACDF and posterior fusion are identified.
There is ventral undulation of the contrast column from C4-C7
related to discectomies. No significant cervical spinal canal
stenosis is identified.

CT CERVICAL MYELOGRAM FINDINGS:

Grade 1 anterolisthesis of C7 on T1 measures 2-3 mm. Sequelae of
C4-C7 ACDF and posterior fusion are identified with solid interbody
osseous fusion at each level. The right C7 screw terminates in the
C7-T1 disc space. No fracture or suspicious osseous lesion is
identified. The spinal cord is normal in caliber. Mild calcific
atherosclerosis is noted at the carotid bifurcations. Lung apices
are clear.

C2-3: Minimal right facet arthrosis without disc herniation or
stenosis, unchanged.

C3-4: Severe right facet arthrosis results in mild right neural
foraminal stenosis without spinal stenosis, unchanged.

C4-5: Interval ACDF and posterior fusion. Mild facet arthrosis
without residual stenosis.

C5-6: Interval ACDF and posterior fusion. Mild facet arthrosis
without residual stenosis.

C6-7: Interval ACDF and posterior fusion. Mild facet arthrosis
without residual stenosis.

C7-T1: Anterolisthesis with mild disc uncovering and moderate facet
arthrosis without stenosis, unchanged.
IMPRESSION: 1. Interval C4-C7 ACDF and posterior fusion without residual
stenosis.
2. Severe right facet arthrosis at C3-4 with mild right neural
foraminal stenosis.
3. Moderate facet arthrosis at C7-T1 with slight anterolisthesis and
no stenosis.

## 2019-06-10 ENCOUNTER — Encounter: Payer: Medicare HMO | Attending: Physical Medicine & Rehabilitation | Admitting: Physical Medicine & Rehabilitation

## 2019-06-10 ENCOUNTER — Other Ambulatory Visit: Payer: Self-pay

## 2019-06-10 VITALS — BP 127/80 | HR 66 | Ht 64.0 in | Wt 136.0 lb

## 2019-06-10 DIAGNOSIS — M533 Sacrococcygeal disorders, not elsewhere classified: Secondary | ICD-10-CM | POA: Diagnosis present

## 2019-06-10 NOTE — Patient Instructions (Signed)
Sacroiliac injection was performed today. A combination of a naming medicine plus a cortisone medicine was injected. The injection was done under x-ray guidance. This procedure has been performed to help reduce low back and buttocks pain as well as potentially hip pain. The duration of this injection is variable lasting from hours to  Months. It may repeated if needed. 

## 2019-06-10 NOTE — Progress Notes (Signed)
  PROCEDURE RECORD [facility]   Name: Emily Leonard DOB:26-May-1946 MRN: [id number]  Date:06/10/2019  Physician: Alysia Penna, MD    Nurse/CMA: Bright,CMA  Allergies:  Allergies  Allergen Reactions  . Erythromycin Swelling  . Latex Other (See Comments)    Blisters  . Morphine Sulfate Other (See Comments)    Hallucinations/deep sleep  . Clozapine Rash  . Fentanyl Nausea Only    "Feels like you are a hundred miles away when talking to her"    Consent Signed: Yes.    Is patient diabetic? No.  CBG today? NA  Pregnant: No. LMP: No LMP recorded. Patient has had a hysterectomy. (age 27-55)  Anticoagulants: no Anti-inflammatory: no Antibiotics: no  Procedure: Bilateral Sacroiliac Injection  Position: Prone   Start Time: 1202pm End Time: 1210pm Fluoro Time:  32s  RN/CMA Truman Hayward, CMA Bright, CMA    Time 11;00am 1218pm    BP 127/80 147/88    Pulse 66 64    Respirations 16 16    O2 Sat 94 95    S/S 6 6    Pain Level 9/10 8/10     D/C home with husband [name], D/C instructions reviewed, and sits independently.

## 2019-06-10 NOTE — Progress Notes (Signed)
Bilateral sacroiliac injections under fluoroscopic guidance  Indication: Low back and buttocks pain not relieved by medication management and other conservative care.  Informed consent was obtained after describing risks and benefits of the procedure with the patient, this includes bleeding, bruising, infection, paralysis and medication side effects. The patient wishes to proceed and has given written consent. The patient was placed in a prone position. The lumbar and sacral area was marked and prepped with Betadine. A 25-gauge 1-1/2 inch needle was inserted into the skin and subcutaneous tissue and 1 mL of 1% lidocaine was injected into each side. Then a 25-gauge 3 inch spinal needle was inserted under fluoroscopic guidance into the left sacroiliac joint. AP and lateral images were utilized. Omnipaque 180x0.5 mL under live fluoroscopy demonstrated no intravascular uptake. Then a solution containing one ML of 6 mg per mL Celestone in 2 ML of 2% lidocaine MPF was injected x1.5 mL. This same procedure was repeated on the right side using the same needle, injectate, and technique. Patient tolerated the procedure well. Post procedure instructions were given. Please see post procedure form.  Patient requesting another prescription for tramadol.  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  The patient took a fentanyl 75 mcg patch from her sister and applied it.  The next day her daughter noticed mental status changes grogginess and brought her to the emergency department.  The patient received Narcan and did not require admission. We discussed that given this incident, I do not feel comfortable prescribing narcotic analgesics such as tramadol.  I did fill her Lyrica 50 mg twice daily.  May need to go up on this potentially. Return to clinic in 6 weeks

## 2019-06-27 ENCOUNTER — Telehealth: Payer: Self-pay

## 2019-06-27 NOTE — Telephone Encounter (Signed)
Patient called requesting Tramadol for her pain. I don't see that patient has been prescribed.

## 2019-06-27 NOTE — Telephone Encounter (Signed)
No longer prescribing tramadol for Emily Leonard she has had some problems with it

## 2019-07-07 ENCOUNTER — Telehealth: Payer: Self-pay

## 2019-07-07 NOTE — Telephone Encounter (Signed)
Please schedule for bilateral US guided greater troch injections

## 2019-07-07 NOTE — Telephone Encounter (Signed)
Emily Leonard called today, asking if she could go ahead and receive her next round of hip injections as she is in a lot of pain at this time.

## 2019-07-11 ENCOUNTER — Other Ambulatory Visit: Payer: Self-pay

## 2019-07-11 ENCOUNTER — Encounter: Payer: Medicare HMO | Attending: Physical Medicine & Rehabilitation | Admitting: Physical Medicine & Rehabilitation

## 2019-07-11 VITALS — BP 153/95 | HR 64 | Temp 97.7°F | Ht 64.0 in | Wt 135.0 lb

## 2019-07-11 DIAGNOSIS — M7062 Trochanteric bursitis, left hip: Secondary | ICD-10-CM | POA: Diagnosis not present

## 2019-07-11 DIAGNOSIS — M7061 Trochanteric bursitis, right hip: Secondary | ICD-10-CM

## 2019-07-11 DIAGNOSIS — M533 Sacrococcygeal disorders, not elsewhere classified: Secondary | ICD-10-CM | POA: Diagnosis present

## 2019-07-11 NOTE — Patient Instructions (Signed)
Gluteus Medius Syndrome  Gluteus medius syndrome causes pain on the outside of your hip due to repeated overstretching or overworking of the gluteus medius muscle. This muscle runs from the top, outer part of your pelvis to the top of your thigh bone (femur). This muscle helps you lift your leg to the side and rotate your leg. It also keeps your hip stable and aligned when you are standing, walking, or running. What are the causes? This condition is caused by small injuries to the gluteus medius muscle over time.  It happens from repetitive movements or a sudden increase in the amount or intensity of activity that involves the legs.  It starts with muscle inflammation and may lead to small tears and scarring in your muscle. What increases the risk? You are more likely to develop this condition if you:  Run on soft or uneven surfaces, such as sand or grass.  Have weakness in your hips and core muscles.  Run long distances.  Increase your time, distance, or intensity of a sport over a short period of time. What are the signs or symptoms? The main symptom of this condition is pain on the outside of your hip with activity.  Usually, the pain will: ? Increase the longer you play sports or run. ? Decrease with rest.  Your hip may also be tender to the touch and you may have muscle spasms. How is this diagnosed? This condition is diagnosed based on your symptoms, medical history, and physical exam.  During the exam, your health care provider will touch different areas of your hip to test for pain.  You may also have imaging studies, such as X-rays and MRI, to rule out other causes of pain. How is this treated? This condition may be treated by a combination of treatments:  Decreasing mileage or time spent doing sports.  Having a coach help you with your running form.  Stretching or strengthening exercises (physical therapy).  Ice or heat therapy.  Massage.  Local electrical  stimulation (transcutaneous electrical nerve stimulation, TENS).  Trigger point injection. A steroid or numbing medicine is injected in the area where you have pain. Follow these instructions at home: Managing pain, stiffness, and swelling      If directed, put ice on the injured area. ? Put ice in a plastic bag. ? Place a towel between your skin and the bag. ? Leave the ice on for 20 minutes, 2-3 times a day.  If directed, apply heat to the affected area before you exercise. Use the heat source that your health care provider recommends, such as a moist heat pack or a heating pad. ? Place a towel between your skin and the heat source. ? Leave the heat on for 20-30 minutes. ? Remove the heat if your skin turns bright red. This is especially important if you are unable to feel pain, heat, or cold. You may have a greater risk of getting burned. Activity  Return to your normal activities as told by your health care provider. Ask your health care provider what activities are safe for you.  Do exercises as told by your physical therapist.  Try not to lie on your painful side. When lying on your other side, put a pillow between your knees to decrease strain on your top hip muscles. General instructions  Take over-the-counter and prescription medicines only as told by your health care provider.  Keep all follow-up visits as told by your health care provider. This is important.  How is this prevented?  Warm up and stretch before being active.  Cool down and stretch after being active.  Give your body time to rest between periods of activity.  Include a variety of exercises and activities in your routine to avoid overuse injuries.  Maintain physical fitness, including: ? Strength. ? Flexibility. ? Cardiovascular fitness. ? Endurance. Contact a health care provider if:  Your pain does not get better or it gets worse. Summary  Gluteus medius syndrome causes pain on the outside of  your hip due to repeated overstretching or overworking of the gluteus medius muscle.  This condition is caused by small injuries to the gluteus medius muscle over time.  Treatments may include physical therapy, massage, and trigger point injection. This information is not intended to replace advice given to you by your health care provider. Make sure you discuss any questions you have with your health care provider. Document Released: 10/02/2005 Document Revised: 01/23/2019 Document Reviewed: 03/04/2018 Elsevier Patient Education  2020 [employer].

## 2019-07-11 NOTE — Progress Notes (Signed)
Trochanteric bursa injection-Bilateral  With ultrasound guidance  Indication Trochanteric bursitis. Exam has tenderness over the greater trochanter of the hip. Pain has not responded to conservative care such as exercise therapy and oral medications. Pain interferes with sleep or with mobility Covid complication  risk 3 moderate Informed consent was obtained after describing risks and benefits of the procedure with the patient these include bleeding bruising and infection. Patient has signed written consent form. Patient placed in a lateral decubitus position with the affected hip superior. Point of maximal pain was palpated marked and prepped with Betadine and entered with a needle to bone contact. Needle slightly withdrawn then 6mg  of betamethasone with 4 cc .5% marcaine were injected.RIght side injected then the left Patient tolerated procedure well. Post procedure instructions given.

## 2019-07-22 ENCOUNTER — Ambulatory Visit: Payer: Medicare HMO | Admitting: Physical Medicine & Rehabilitation

## 2019-07-28 NOTE — Progress Notes (Signed)
HPI The patient presents for follow up of chest discomfort and palpitations. She's had a stress test in 2015 which was unremarkable.  Since I last saw her she has had no change in symptoms.  She is limited by arthritis and back pain.  She feels sporadic fluttering which she has had before.  However, she is had no sustained tachypalpitations.  She has had no presyncope or syncope.  She has had no new shortness of breath, PND or orthopnea.  She still gets around and does her housework but she is not doing any cleaning of other houses which she was doing.   Allergies  Allergen Reactions  . Erythromycin Swelling  . Latex Other (See Comments)    Blisters  . Morphine Sulfate Other (See Comments)    Hallucinations/deep sleep  . Clozapine Rash  . Fentanyl Nausea Only    "Feels like you are a hundred miles away when talking to her"    Current Outpatient Medications  Medication Sig Dispense Refill  . alendronate (FOSAMAX) 70 MG tablet Take 70 mg by mouth once a week. Saturday    . aspirin EC 81 MG tablet Take 81 mg by mouth daily.    . budesonide-formoterol (SYMBICORT) 160-4.5 MCG/ACT inhaler Inhale 2 puffs into the lungs daily as needed. 1 Inhaler 3  . calcium carbonate (OS-CAL) 600 MG TABS Take 600 mg by mouth daily.     . cyanocobalamin 500 MCG tablet Take 500 mcg by mouth daily.     Marland Kitchen diltiazem (CARDIZEM) 30 MG tablet TAKE 1 TABLET BY MOUTH AT BEDTIME 90 tablet 1  . docusate sodium (COLACE) 100 MG capsule Take 1 capsule (100 mg total) by mouth 2 (two) times daily. 60 capsule 0  . DULoxetine (CYMBALTA) 30 MG capsule Take 1 capsule (30 mg total) by mouth daily. (Patient taking differently: Take 30 mg by mouth 2 (two) times daily. ) 30 capsule 5  . GAVILYTE-N WITH FLAVOR PACK 420 G solution Take 4,000 mLs by mouth once.     . hydrochlorothiazide 25 MG tablet Take 25 mg by mouth daily.     Marland Kitchen levothyroxine (SYNTHROID, LEVOTHROID) 100 MCG tablet Take 100 mcg by mouth daily before breakfast. In  the morning.  1  . lidocaine (LIDODERM) 5 % Place 2 patches onto the skin daily. Remove & Discard patch within 12 hours or as directed by MD 60 patch 2  . Omega-3 Fatty Acids (FISH OIL) 1000 MG CAPS Take 1,000 mg by mouth daily.     . pravastatin (PRAVACHOL) 80 MG tablet TAKE 1 TABLET BY MOUTH AT BEDTIME 90 tablet 3  . pregabalin (LYRICA) 50 MG capsule Take 1 capsule (50 mg total) by mouth 2 (two) times daily. 60 capsule 5  . traZODone (DESYREL) 150 MG tablet Take 150 mg by mouth at bedtime.   0   No current facility-administered medications for this visit.     Past Medical History:  Diagnosis Date  . Anxiety   . Chronic pain syndrome   . COPD (chronic obstructive pulmonary disease) ([place])   . Degenerative disc disease   . Degenerative joint disease   . Depression   . Essential hypertension, benign   . GERD (gastroesophageal reflux disease)   . Hyperlipidemia   . Hypothyroidism   . Insomnia   . Osteoarthritis   . Palpitations   . Restless leg syndrome   . TIA (transient ischemic attack)     Past Surgical History:  Procedure Laterality Date  .  ABDOMINAL HYSTERECTOMY    . ANTERIOR CERVICAL DECOMP/DISCECTOMY FUSION N/A 05/07/2017   Procedure: ANTERIOR CERVICAL DECOMPRESSION/DISCECTOMY FUSION CERVICAL FOUR- CERVICAL FIVE, CERVICAL FIVE- CERVICAL SIX, CERVICAL SIX- CERVICAL SEVEN;  Surgeon: Newman Pies, MD;  Location: [place];  Service: Neurosurgery;  Laterality: N/A;  . ANTERIOR CERVICAL DECOMP/DISCECTOMY FUSION N/A 06/21/2017   Procedure: Revision Anterior Instrumentation Cervical four-seven;  Surgeon: Newman Pies, MD;  Location: [place];  Service: Neurosurgery;  Laterality: N/A;  . APPENDECTOMY    . CATARACT EXTRACTION, BILATERAL    . CERVICAL SPINE SURGERY    . COLONOSCOPY  02/2013  . HEMORRHOID SURGERY  03/2013  . LUMBAR SPINE SURGERY     x 3  . POSTERIOR CERVICAL FUSION/FORAMINOTOMY N/A 06/21/2017   Procedure: POSTERIOR CERVICAL FUSION WITH LATERAL MASS FIXATION CERVICAL  FOUR- CERVICAL FIVE, CERVICAL FIVE- CERVICAL SIX, CERVICAL SIX- CERVICAL SEVEN;  Surgeon: Newman Pies, MD;  Location: [place];  Service: Neurosurgery;  Laterality: N/A;    ROS: Hard of hearing, chronic back pain.   Otherwise, as stated in the HPI and negative for all other systems.   PHYSICAL EXAM BP 119/79   Pulse (!) 55   Temp (!) 96.9 F (36.1 C)   Ht 5\' 4"  (1.626 m)   Wt 136 lb (61.7 kg)   SpO2 94%   BMI 23.34 kg/m   GENERAL:  Well appearing NECK:  No jugular venous distention, waveform within normal limits, carotid upstroke brisk and symmetric, no bruits, no thyromegaly LUNGS:  Clear to auscultation bilaterally CHEST:  Unremarkable HEART:  PMI not displaced or sustained,S1 and S2 within normal limits, no S3, no S4, no clicks, no rubs, no murmurs ABD:  Flat, positive bowel sounds normal in frequency in pitch, no bruits, no rebound, no guarding, no midline pulsatile mass, no hepatomegaly, no splenomegaly EXT:  2 plus pulses throughout, no edema, no cyanosis no clubbing   EKG: Normal sinus rhythm, rate 53, axis within normal limits, poor anterior R wave progression, no acute ST-T wave changes.     ASSESSMENT AND PLAN  PALPITATIONS:    These are unchanged from previous.  She takes low-dose Cardizem.  No change in therapy.   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  No change in therapy.   HTN:  The blood pressure is at target.  No change in therapy.   DYSLIPIDEMIA:  LDL was mildly elevated at 90 but the HDL is 82.  She will continue with current therapies.   TOBACCO ABUSE:  She has not been able to quit.  We talked about this at length.

## 2019-07-29 ENCOUNTER — Ambulatory Visit: Payer: Medicare HMO | Admitting: Cardiology

## 2019-07-29 ENCOUNTER — Encounter: Payer: Self-pay | Admitting: Cardiology

## 2019-07-29 ENCOUNTER — Other Ambulatory Visit: Payer: Self-pay

## 2019-07-29 VITALS — BP 119/79 | HR 55 | Temp 96.9°F | Ht 64.0 in | Wt 136.0 lb

## 2019-07-29 DIAGNOSIS — I10 Essential (primary) hypertension: Secondary | ICD-10-CM | POA: Diagnosis not present

## 2019-07-29 DIAGNOSIS — R002 Palpitations: Secondary | ICD-10-CM

## 2019-07-29 DIAGNOSIS — Z720 Tobacco use: Secondary | ICD-10-CM | POA: Diagnosis not present

## 2019-07-29 DIAGNOSIS — E785 Hyperlipidemia, unspecified: Secondary | ICD-10-CM

## 2019-07-29 NOTE — Patient Instructions (Signed)
Medication Instructions:  Your physician recommends that you continue on your current medications as directed. Please refer to the Current Medication list given to you today.  If you need a refill on your cardiac medications before your next appointment, please call your pharmacy.   Lab work: NONE  Testing/Procedures: NONE  Follow-Up: At [facility], you and your health needs are our priority.  As part of our continuing mission to provide you with exceptional heart care, we have created designated Provider Care Teams.  These Care Teams include your primary Cardiologist (physician) and Advanced Practice Providers (APPs -  Physician Assistants and Nurse Practitioners) who all work together to provide you with the care you need, when you need it. You will need a follow up appointment in 12 months.  Please call our office 2 months in advance to schedule this appointment.  You may see Dr. Hochrein or one of the following Advanced Practice Providers on your designated Care Team:   Rhonda Barrett, PA-C Kathryn Lawrence, DNP, ANP      

## 2019-08-07 ENCOUNTER — Telehealth: Payer: Self-pay | Admitting: *Deleted

## 2019-08-07 NOTE — Telephone Encounter (Signed)
Patient left a message stating she is in a lot of pain.  She is asking for lower back injections

## 2019-08-11 NOTE — Telephone Encounter (Signed)
Set up for RIght vs left L5 DR, S1,2,3 lateral branch blocks.  Needs to tell us which side hurts more

## 2019-08-12 NOTE — Telephone Encounter (Signed)
Contacted patient, she states that both sides hurt equally

## 2019-08-12 NOTE — Telephone Encounter (Signed)
I would schedule for the right side.  We will need a control side so we can make a comparison in terms of her pain relief.  It is difficult to get a clear history from her

## 2019-09-10 NOTE — Patient Instructions (Addendum)
DUE TO COVID-19 ONLY ONE VISITOR IS ALLOWED TO COME WITH YOU AND STAY IN THE WAITING ROOM ONLY DURING PRE OP AND PROCEDURE DAY OF SURGERY. THE 1 VISITOR MAY VISIT WITH YOU AFTER SURGERY IN YOUR PRIVATE ROOM DURING VISITING HOURS ONLY!  YOU NEED TO HAVE A COVID 19 TEST ON___11-30__ THIS TEST MUST BE DONE BEFORE SURGERY, COME  [address], [name] , [id number].  ([place]) ONCE YOUR COVID TEST IS COMPLETED, PLEASE BEGIN THE QUARANTINE INSTRUCTIONS AS OUTLINED IN YOUR HANDOUT.                Emily [place]   Your procedure is scheduled on: 12-2   Report to [facility]  Entrance   Report to admitting at 2:00PM     Call this number if you have problems the morning of surgery [phone]    NO SOLID FOOD AFTER MIDNIGHT THE NIGHT PRIOR TO SURGERY. YOU MAY DRINK CLEAR LIQUIDS.   STOP CLEAR LIQUIDS AT ____1:30PM_____ AND THEN DRINK THE ENSURE PRE-SURGERY [place]. NOTHING BY MOUTH AFTER THE ENSURE DRINK!    CLEAR LIQUID DIET   Foods Allowed                                                                     Foods Excluded  Coffee and tea, regular and decaf                             liquids that you cannot  Plain Jell-O any favor except red or purple                                           see through such as: Fruit ices (not with fruit pulp)                                     milk, soups, orange juice  Iced Popsicles                                    All solid food Carbonated beverages, regular and diet                                    Cranberry, grape and apple juices Sports drinks like Gatorade Lightly seasoned clear broth or consume(fat free) Sugar, honey syrup  Sample Menu Breakfast                                Lunch                                     Supper Cranberry juice  Beef broth                            Chicken broth Jell-O                                     Grape juice                           Apple  juice Coffee or tea                        Jell-O                                      Popsicle                                                Coffee or tea                        Coffee or tea  _____________________________________________________________________  BRUSH YOUR TEETH MORNING OF SURGERY AND RINSE YOUR MOUTH OUT, NO CHEWING GUM CANDY OR MINTS.     Take these medicines the morning of surgery with A SIP OF WATER: DULOXETINE, LEVOTHYROXINE, INHALER IF NEEDED                                 You may not have any metal on your body including hair pins and              piercings  Do not wear jewelry, make-up, lotions, powders or perfumes, deodorant             Do not wear nail polish on your fingernails.  Do not shave  48 hours prior to surgery.              Do not bring valuables to the hospital. [place].  Contacts, dentures or bridgework may not be worn into surgery.  YOU MAY BRING A SMALL OVERNIGHT BAG                Please read over the following fact sheets you were given: _____________________________________________________________________             [facility] - Preparing for Surgery Before surgery, you can play an important role.  Because skin is not sterile, your skin needs to be as free of germs as possible.  You can reduce the number of germs on your skin by washing with CHG (chlorahexidine gluconate) soap before surgery.  CHG is an antiseptic cleaner which kills germs and bonds with the skin to continue killing germs even after washing. Please DO NOT use if you have an allergy to CHG or antibacterial soaps.  If your skin becomes reddened/irritated stop using the CHG and inform your nurse when you arrive at Short Stay. Do not shave (including legs and underarms) for at least 48 hours prior to the  first CHG shower.  You may shave your face/neck. Please follow these instructions carefully:  1.  Shower with CHG  Soap the night before surgery and the  morning of Surgery.  2.  If you choose to wash your hair, wash your hair first as usual with your  normal  shampoo.  3.  After you shampoo, rinse your hair and body thoroughly to remove the  shampoo.                           4.  Use CHG as you would any other liquid soap.  You can apply chg directly  to the skin and wash                       Gently with a scrungie or clean washcloth.  5.  Apply the CHG Soap to your body ONLY FROM THE NECK DOWN.   Do not use on face/ open                           Wound or open sores. Avoid contact with eyes, ears mouth and genitals (private parts).                       Wash face,  Genitals (private parts) with your normal soap.             6.  Wash thoroughly, paying special attention to the area where your surgery  will be performed.  7.  Thoroughly rinse your body with warm water from the neck down.  8.  DO NOT shower/wash with your normal soap after using and rinsing off  the CHG Soap.                9.  Pat yourself dry with a clean towel.            10.  Wear clean pajamas.            11.  Place clean sheets on your bed the night of your first shower and do not  sleep with pets. Day of Surgery : Do not apply any lotions/deodorants the morning of surgery.  Please wear clean clothes to the hospital/surgery center.  FAILURE TO FOLLOW THESE INSTRUCTIONS MAY RESULT IN THE CANCELLATION OF YOUR SURGERY PATIENT SIGNATURE_________________________________  NURSE SIGNATURE__________________________________  ________________________________________________________________________   Emily Leonard  An incentive spirometer is a tool that can help keep your lungs clear and active. This tool measures how well you are filling your lungs with each breath. Taking long deep breaths may help reverse or decrease the chance of developing breathing (pulmonary) problems (especially infection) following:  A long period of time  when you are unable to move or be active. BEFORE THE PROCEDURE   If the spirometer includes an indicator to show your best effort, your nurse or respiratory therapist will set it to a desired goal.  If possible, sit up straight or lean slightly forward. Try not to slouch.  Hold the incentive spirometer in an upright position. INSTRUCTIONS FOR USE  1. Sit on the edge of your bed if possible, or sit up as far as you can in bed or on a chair. 2. Hold the incentive spirometer in an upright position. 3. Breathe out normally. 4. Place the mouthpiece in your mouth and seal your lips tightly around it. 5. Breathe in slowly and  as deeply as possible, raising the piston or the ball toward the top of the column. 6. Hold your breath for 3-5 seconds or for as long as possible. Allow the piston or ball to fall to the bottom of the column. 7. Remove the mouthpiece from your mouth and breathe out normally. 8. Rest for a few seconds and repeat Steps 1 through 7 at least 10 times every 1-2 hours when you are awake. Take your time and take a few normal breaths between deep breaths. 9. The spirometer may include an indicator to show your best effort. Use the indicator as a goal to work toward during each repetition. 10. After each set of 10 deep breaths, practice coughing to be sure your lungs are clear. If you have an incision (the cut made at the time of surgery), support your incision when coughing by placing a pillow or rolled up towels firmly against it. Once you are able to get out of bed, walk around indoors and cough well. You may stop using the incentive spirometer when instructed by your caregiver.  RISKS AND COMPLICATIONS  Take your time so you do not get dizzy or light-headed.  If you are in pain, you may need to take or ask for pain medication before doing incentive spirometry. It is harder to take a deep breath if you are having pain. AFTER USE  Rest and breathe slowly and easily.  It can be  helpful to keep track of a log of your progress. Your caregiver can provide you with a simple table to help with this. If you are using the spirometer at home, follow these instructions: [place] IF:   You are having difficultly using the spirometer.  You have trouble using the spirometer as often as instructed.  Your pain medication is not giving enough relief while using the spirometer.  You develop fever of 100.5 F (38.1 C) or higher. SEEK IMMEDIATE MEDICAL CARE IF:   You cough up bloody sputum that had not been present before.  You develop fever of 102 F (38.9 C) or greater.  You develop worsening pain at or near the incision site. MAKE SURE YOU:   Understand these instructions.  Will watch your condition.  Will get help right away if you are not doing well or get worse. Document Released: 02/12/2007 Document Revised: 12/25/2011 Document Reviewed: 04/15/2007 Exit[employer] Patient Information 2014 [employer], [place].   ________________________________________________________________________  WHAT IS A BLOOD TRANSFUSION? Blood Transfusion Information  A transfusion is the replacement of blood or some of its parts. Blood is made up of multiple cells which provide different functions.  Red blood cells carry oxygen and are used for blood loss replacement.  White blood cells fight against infection.  Platelets control bleeding.  Plasma helps clot blood.  Other blood products are available for specialized needs, such as hemophilia or other clotting disorders. BEFORE THE TRANSFUSION  Who gives blood for transfusions?   Healthy volunteers who are fully evaluated to make sure their blood is safe. This is blood bank blood. Transfusion therapy is the safest it has ever been in the practice of medicine. Before blood is taken from a donor, a complete history is taken to make sure that person has no history of diseases nor engages in risky social behavior (examples are  intravenous drug use or sexual activity with multiple partners). The donor's travel history is screened to minimize risk of transmitting infections, such as malaria. The donated blood is tested for signs of  infectious diseases, such as HIV and hepatitis. The blood is then tested to be sure it is compatible with you in order to minimize the chance of a transfusion reaction. If you or a relative donates blood, this is often done in anticipation of surgery and is not appropriate for emergency situations. It takes many days to process the donated blood. RISKS AND COMPLICATIONS Although transfusion therapy is very safe and saves many lives, the main dangers of transfusion include:   Getting an infectious disease.  Developing a transfusion reaction. This is an allergic reaction to something in the blood you were given. Every precaution is taken to prevent this. The decision to have a blood transfusion has been considered carefully by your caregiver before blood is given. Blood is not given unless the benefits outweigh the risks. AFTER THE TRANSFUSION  Right after receiving a blood transfusion, you will usually feel much better and more energetic. This is especially true if your red blood cells have gotten low (anemic). The transfusion raises the level of the red blood cells which carry oxygen, and this usually causes an energy increase.  The nurse administering the transfusion will monitor you carefully for complications. HOME CARE INSTRUCTIONS  No special instructions are needed after a transfusion. You may find your energy is better. Speak with your caregiver about any limitations on activity for underlying diseases you may have. SEEK MEDICAL CARE IF:   Your condition is not improving after your transfusion.  You develop redness or irritation at the intravenous (IV) site. SEEK IMMEDIATE MEDICAL CARE IF:  Any of the following symptoms occur over the next 12 hours:  Shaking chills.  You have a  temperature by mouth above 102 F (38.9 C), not controlled by medicine.  Chest, back, or muscle pain.  People around you feel you are not acting correctly or are confused.  Shortness of breath or difficulty breathing.  Dizziness and fainting.  You get a rash or develop hives.  You have a decrease in urine output.  Your urine turns a dark color or changes to pink, red, or [place]. Any of the following symptoms occur over the next 10 days:  You have a temperature by mouth above 102 F (38.9 C), not controlled by medicine.  Shortness of breath.  Weakness after normal activity.  The white part of the eye turns yellow (jaundice).  You have a decrease in the amount of urine or are urinating less often.  Your urine turns a dark color or changes to pink, red, or [place]. Document Released: 09/29/2000 Document Revised: 12/25/2011 Document Reviewed: 05/18/2008 [facility] Patient Information 2014 [place], [place].  _______________________________________________________________________

## 2019-09-15 ENCOUNTER — Encounter (HOSPITAL_COMMUNITY)
Admission: RE | Admit: 2019-09-15 | Discharge: 2019-09-15 | Disposition: A | Discharge status: Home / Self Care | Payer: Medicare HMO | Source: Ambulatory Visit | Attending: Orthopedic Surgery | Admitting: Orthopedic Surgery

## 2019-09-15 ENCOUNTER — Other Ambulatory Visit (HOSPITAL_COMMUNITY)
Admission: RE | Admit: 2019-09-15 | Discharge: 2019-09-15 | Disposition: A | Discharge status: Home / Self Care | Payer: Medicare HMO | Source: Ambulatory Visit | Attending: Orthopedic Surgery | Admitting: Orthopedic Surgery

## 2019-09-15 ENCOUNTER — Other Ambulatory Visit: Payer: Self-pay

## 2019-09-15 ENCOUNTER — Encounter (HOSPITAL_COMMUNITY): Payer: Self-pay

## 2019-09-15 DIAGNOSIS — M7061 Trochanteric bursitis, right hip: Secondary | ICD-10-CM | POA: Insufficient documentation

## 2019-09-15 DIAGNOSIS — Z01812 Encounter for preprocedural laboratory examination: Secondary | ICD-10-CM | POA: Insufficient documentation

## 2019-09-15 DIAGNOSIS — Z20828 Contact with and (suspected) exposure to other viral communicable diseases: Secondary | ICD-10-CM | POA: Diagnosis not present

## 2019-09-15 LAB — CBC WITH DIFFERENTIAL/PLATELET
Abs Immature Granulocytes: 0.01 10*3/uL (ref 0.00–0.07)
Basophils Absolute: 0.1 10*3/uL (ref 0.0–0.1)
Basophils Relative: 2 %
Eosinophils Absolute: 0.1 10*3/uL (ref 0.0–0.5)
Eosinophils Relative: 1 %
HCT: 50.3 % — ABNORMAL HIGH (ref 36.0–46.0)
Hemoglobin: 16.7 g/dL — ABNORMAL HIGH (ref 12.0–15.0)
Immature Granulocytes: 0 %
Lymphocytes Relative: 41 %
Lymphs Abs: 2.9 10*3/uL (ref 0.7–4.0)
MCH: 31.4 pg (ref 26.0–34.0)
MCHC: 33.2 g/dL (ref 30.0–36.0)
MCV: 94.5 fL (ref 80.0–100.0)
Monocytes Absolute: 0.6 10*3/uL (ref 0.1–1.0)
Monocytes Relative: 8 %
Neutro Abs: 3.5 10*3/uL (ref 1.7–7.7)
Neutrophils Relative %: 48 %
Platelets: 291 10*3/uL (ref 150–400)
RBC: 5.32 MIL/uL — ABNORMAL HIGH (ref 3.87–5.11)
RDW: 15 % (ref 11.5–15.5)
WBC: 7.2 10*3/uL (ref 4.0–10.5)
nRBC: 0 % (ref 0.0–0.2)

## 2019-09-15 LAB — COMPREHENSIVE METABOLIC PANEL
ALT: 19 U/L (ref 0–44)
AST: 22 U/L (ref 15–41)
Albumin: 4.4 g/dL (ref 3.5–5.0)
Alkaline Phosphatase: 59 U/L (ref 38–126)
Anion gap: 9 (ref 5–15)
BUN: 20 mg/dL (ref 8–23)
CO2: 28 mmol/L (ref 22–32)
Calcium: 10.1 mg/dL (ref 8.9–10.3)
Chloride: 102 mmol/L (ref 98–111)
Creatinine, Ser: 0.98 mg/dL (ref 0.44–1.00)
GFR calc Af Amer: >60 mL/min (ref >60)
GFR calc non Af Amer: 57 mL/min — ABNORMAL LOW (ref >60)
Glucose, Bld: 99 mg/dL (ref 70–99)
Potassium: 4.6 mmol/L (ref 3.5–5.1)
Sodium: 139 mmol/L (ref 135–145)
Total Bilirubin: 0.8 mg/dL (ref 0.3–1.2)
Total Protein: 7.4 g/dL (ref 6.5–8.1)

## 2019-09-15 LAB — SARS CORONAVIRUS 2 (TAT 6-24 HRS): SARS Coronavirus 2: NEGATIVE

## 2019-09-15 LAB — PROTIME-INR
INR: 0.9 (ref 0.8–1.2)
Prothrombin Time: 12.2 seconds (ref 11.4–15.2)

## 2019-09-15 LAB — APTT: aPTT: 29 seconds (ref 24–36)

## 2019-09-15 NOTE — Progress Notes (Addendum)
PCP - Jene Every, MD Cardiologist - Percival Spanish , MD , lov 07-29-2019  Chest x-ray -  EKG - 10-13 epic  Stress Test -  ECHO -  Cardiac Cath -   Sleep Study -  CPAP -   Fasting Blood Sugar -  Checks Blood Sugar _____ times a day  Blood Thinner Instructions: Aspirin Instructions: on hold as of 11/25 Last Dose:  Anesthesia review:   dime sized skin tear to right knee sustained on 11-26 after son scratched skin on knee cap with his fingernail .  Wound is superficial and flat in appearance , with scant dried blood . No obvious signs of inflammation or infection . Patient covering wound with bad aid today . PA made aware and assessed site at pre-op appt.    Patient denies shortness of breath, fever, cough and chest pain at PAT appointment   Patient verbalized understanding of instructions that were given to them at the PAT appointment. Patient was also instructed that they will need to review over the PAT instructions again at home before surgery.

## 2019-09-16 MED ORDER — BUPIVACAINE LIPOSOME 1.3 % IJ SUSP
20.0000 mL | Freq: Once | INTRAMUSCULAR | Status: DC
  Filled 2019-09-16: qty 20

## 2019-09-17 ENCOUNTER — Other Ambulatory Visit: Payer: Self-pay

## 2019-09-17 ENCOUNTER — Telehealth (HOSPITAL_COMMUNITY): Payer: Self-pay | Admitting: *Deleted

## 2019-09-17 ENCOUNTER — Encounter (HOSPITAL_COMMUNITY): Payer: Self-pay | Admitting: Certified Registered Nurse Anesthetist

## 2019-09-17 ENCOUNTER — Ambulatory Visit (HOSPITAL_COMMUNITY)
Admission: RE | Admit: 2019-09-17 | Discharge: 2019-09-17 | Disposition: A | Discharge status: Home / Self Care | Payer: Medicare HMO | Source: Ambulatory Visit | Attending: Orthopedic Surgery | Admitting: Orthopedic Surgery

## 2019-09-17 ENCOUNTER — Encounter (HOSPITAL_COMMUNITY)
Admission: RE | Disposition: A | Discharge status: Home / Self Care | Payer: Self-pay | Source: Ambulatory Visit | Attending: Orthopedic Surgery

## 2019-09-17 ENCOUNTER — Ambulatory Visit (HOSPITAL_COMMUNITY): Payer: Medicare HMO | Admitting: Physician Assistant

## 2019-09-17 ENCOUNTER — Ambulatory Visit (HOSPITAL_COMMUNITY): Payer: Medicare HMO | Admitting: Certified Registered Nurse Anesthetist

## 2019-09-17 DIAGNOSIS — G2581 Restless legs syndrome: Secondary | ICD-10-CM | POA: Diagnosis not present

## 2019-09-17 DIAGNOSIS — Z79890 Hormone replacement therapy (postmenopausal): Secondary | ICD-10-CM | POA: Insufficient documentation

## 2019-09-17 DIAGNOSIS — Z8673 Personal history of transient ischemic attack (TIA), and cerebral infarction without residual deficits: Secondary | ICD-10-CM | POA: Diagnosis not present

## 2019-09-17 DIAGNOSIS — E785 Hyperlipidemia, unspecified: Secondary | ICD-10-CM | POA: Diagnosis not present

## 2019-09-17 DIAGNOSIS — Z791 Long term (current) use of non-steroidal anti-inflammatories (NSAID): Secondary | ICD-10-CM | POA: Diagnosis not present

## 2019-09-17 DIAGNOSIS — I10 Essential (primary) hypertension: Secondary | ICD-10-CM | POA: Insufficient documentation

## 2019-09-17 DIAGNOSIS — Z79899 Other long term (current) drug therapy: Secondary | ICD-10-CM | POA: Insufficient documentation

## 2019-09-17 DIAGNOSIS — J449 Chronic obstructive pulmonary disease, unspecified: Secondary | ICD-10-CM | POA: Insufficient documentation

## 2019-09-17 DIAGNOSIS — G4700 Insomnia, unspecified: Secondary | ICD-10-CM | POA: Insufficient documentation

## 2019-09-17 DIAGNOSIS — E039 Hypothyroidism, unspecified: Secondary | ICD-10-CM | POA: Insufficient documentation

## 2019-09-17 DIAGNOSIS — M1990 Unspecified osteoarthritis, unspecified site: Secondary | ICD-10-CM | POA: Diagnosis not present

## 2019-09-17 DIAGNOSIS — F419 Anxiety disorder, unspecified: Secondary | ICD-10-CM | POA: Insufficient documentation

## 2019-09-17 DIAGNOSIS — Z981 Arthrodesis status: Secondary | ICD-10-CM | POA: Diagnosis not present

## 2019-09-17 DIAGNOSIS — M7062 Trochanteric bursitis, left hip: Secondary | ICD-10-CM | POA: Diagnosis present

## 2019-09-17 DIAGNOSIS — M7071 Other bursitis of hip, right hip: Secondary | ICD-10-CM | POA: Diagnosis present

## 2019-09-17 DIAGNOSIS — F329 Major depressive disorder, single episode, unspecified: Secondary | ICD-10-CM | POA: Diagnosis not present

## 2019-09-17 DIAGNOSIS — Z7982 Long term (current) use of aspirin: Secondary | ICD-10-CM | POA: Insufficient documentation

## 2019-09-17 DIAGNOSIS — G894 Chronic pain syndrome: Secondary | ICD-10-CM | POA: Insufficient documentation

## 2019-09-17 DIAGNOSIS — M7061 Trochanteric bursitis, right hip: Secondary | ICD-10-CM | POA: Diagnosis present

## 2019-09-17 HISTORY — PX: EXCISION/RELEASE BURSA HIP: SHX5014

## 2019-09-17 SURGERY — RELEASE, BURSA, TROCHANTERIC
Anesthesia: General | Site: Hip | Laterality: Right

## 2019-09-17 MED ORDER — ONDANSETRON HCL 4 MG/2ML IJ SOLN
INTRAMUSCULAR | Status: AC
  Filled 2019-09-17: qty 2

## 2019-09-17 MED ORDER — EPHEDRINE 5 MG/ML INJ
INTRAVENOUS | Status: AC
  Filled 2019-09-17: qty 10

## 2019-09-17 MED ORDER — HYDROCODONE-ACETAMINOPHEN 5-325 MG PO TABS: 1.0000 | ORAL_TABLET | ORAL | Status: DC | PRN

## 2019-09-17 MED ORDER — CHLORHEXIDINE GLUCONATE 4 % EX LIQD: 60.0000 mL | Freq: Once | CUTANEOUS | Status: DC

## 2019-09-17 MED ORDER — BUPIVACAINE HCL (PF) 0.25 % IJ SOLN
INTRAMUSCULAR | Status: AC
  Filled 2019-09-17: qty 30

## 2019-09-17 MED ORDER — OXYCODONE HCL 5 MG PO TABS
ORAL_TABLET | ORAL | Status: AC
  Filled 2019-09-17: qty 1

## 2019-09-17 MED ORDER — LIDOCAINE 2% (20 MG/ML) 5 ML SYRINGE
INTRAMUSCULAR | Status: AC
  Filled 2019-09-17: qty 5

## 2019-09-17 MED ORDER — DEXAMETHASONE SODIUM PHOSPHATE 10 MG/ML IJ SOLN
INTRAMUSCULAR | Status: DC | PRN
  Administered 2019-09-17: 10 mg via INTRAVENOUS

## 2019-09-17 MED ORDER — ACETAMINOPHEN 10 MG/ML IV SOLN
1000.0000 mg | Freq: Four times a day (QID) | INTRAVENOUS | Status: DC
  Administered 2019-09-17: 1000 mg via INTRAVENOUS
  Filled 2019-09-17: qty 100

## 2019-09-17 MED ORDER — LACTATED RINGERS IV SOLN
INTRAVENOUS | Status: DC
  Administered 2019-09-17 (×2): via INTRAVENOUS

## 2019-09-17 MED ORDER — EPHEDRINE SULFATE-NACL 50-0.9 MG/10ML-% IV SOSY
PREFILLED_SYRINGE | INTRAVENOUS | Status: DC | PRN
  Administered 2019-09-17: 10 mg via INTRAVENOUS
  Administered 2019-09-17: 15 mg via INTRAVENOUS
  Administered 2019-09-17: 20 mg via INTRAVENOUS
  Administered 2019-09-17 (×3): 10 mg via INTRAVENOUS

## 2019-09-17 MED ORDER — 0.9 % SODIUM CHLORIDE (POUR BTL) OPTIME
TOPICAL | Status: DC | PRN
  Administered 2019-09-17: 1000 mL

## 2019-09-17 MED ORDER — ONDANSETRON HCL 4 MG/2ML IJ SOLN
INTRAMUSCULAR | Status: DC | PRN
  Administered 2019-09-17: 4 mg via INTRAVENOUS

## 2019-09-17 MED ORDER — LIDOCAINE 2% (20 MG/ML) 5 ML SYRINGE
INTRAMUSCULAR | Status: DC | PRN
  Administered 2019-09-17: 60 mg via INTRAVENOUS

## 2019-09-17 MED ORDER — HYDROMORPHONE HCL 1 MG/ML IJ SOLN: 0.2500 mg | INTRAMUSCULAR | Status: DC | PRN

## 2019-09-17 MED ORDER — TIZANIDINE HCL 4 MG PO TABS: 4.0000 mg | ORAL_TABLET | Freq: Three times a day (TID) | ORAL | 1 refills | Status: AC | PRN

## 2019-09-17 MED ORDER — PROMETHAZINE HCL 25 MG/ML IJ SOLN: 6.2500 mg | INTRAMUSCULAR | Status: DC | PRN

## 2019-09-17 MED ORDER — PROPOFOL 10 MG/ML IV BOLUS
INTRAVENOUS | Status: AC
  Filled 2019-09-17: qty 20

## 2019-09-17 MED ORDER — METHOCARBAMOL 500 MG IVPB - SIMPLE MED
500.0000 mg | Freq: Four times a day (QID) | INTRAVENOUS | Status: DC | PRN
  Administered 2019-09-17: 500 mg via INTRAVENOUS

## 2019-09-17 MED ORDER — PROPOFOL 10 MG/ML IV BOLUS
INTRAVENOUS | Status: DC | PRN
  Administered 2019-09-17: 180 mg via INTRAVENOUS

## 2019-09-17 MED ORDER — DEXAMETHASONE SODIUM PHOSPHATE 10 MG/ML IJ SOLN
INTRAMUSCULAR | Status: AC
  Filled 2019-09-17: qty 1

## 2019-09-17 MED ORDER — OXYCODONE HCL 5 MG/5ML PO SOLN: 5.0000 mg | Freq: Once | ORAL | Status: AC | PRN

## 2019-09-17 MED ORDER — OXYCODONE HCL 5 MG PO TABS
5.0000 mg | ORAL_TABLET | Freq: Once | ORAL | Status: AC | PRN
  Administered 2019-09-17: 5 mg via ORAL

## 2019-09-17 MED ORDER — POVIDONE-IODINE 10 % EX SWAB
2.0000 "application " | Freq: Once | CUTANEOUS | Status: AC
  Administered 2019-09-17: 2 via TOPICAL

## 2019-09-17 MED ORDER — CEFAZOLIN SODIUM-DEXTROSE 2-4 GM/100ML-% IV SOLN
INTRAVENOUS | Status: AC
  Filled 2019-09-17: qty 100

## 2019-09-17 MED ORDER — BUPIVACAINE LIPOSOME 1.3 % IJ SUSP
INTRAMUSCULAR | Status: DC | PRN
  Administered 2019-09-17: 20 mL

## 2019-09-17 MED ORDER — BUPIVACAINE HCL 0.25 % IJ SOLN
INTRAMUSCULAR | Status: DC | PRN
  Administered 2019-09-17: 30 mL

## 2019-09-17 MED ORDER — CEFAZOLIN SODIUM-DEXTROSE 2-4 GM/100ML-% IV SOLN
2.0000 g | INTRAVENOUS | Status: AC
  Administered 2019-09-17: 15:00:00 2 g via INTRAVENOUS

## 2019-09-17 SURGICAL SUPPLY — 50 items
BAG SPEC THK2 15X12 ZIP CLS (MISCELLANEOUS) ×1
BAG ZIPLOCK 12X15 (MISCELLANEOUS) ×2 IMPLANT
BIT DRILL 2.4X128 (BIT) ×1 IMPLANT
BIT DRILL 2.4X128MM (BIT) ×1
BLADE EXTENDED COATED 6.5IN (ELECTRODE) ×3 IMPLANT
CLOSURE WOUND 1/2 X4 (GAUZE/BANDAGES/DRESSINGS) ×2
COVER SURGICAL LIGHT HANDLE (MISCELLANEOUS) ×3 IMPLANT
COVER WAND RF STERILE (DRAPES) IMPLANT
DRAPE INCISE IOBAN 66X45 STRL (DRAPES) ×3 IMPLANT
DRAPE ORTHO SPLIT 77X108 STRL (DRAPES) ×6
DRAPE POUCH INSTRU U-SHP 10X18 (DRAPES) ×3 IMPLANT
DRAPE SURG ORHT 6 SPLT 77X108 (DRAPES) ×2 IMPLANT
DRAPE U-SHAPE 47X51 STRL (DRAPES) ×5 IMPLANT
DRSG ADAPTIC 3X8 NADH LF (GAUZE/BANDAGES/DRESSINGS) ×3 IMPLANT
DRSG MEPILEX BORDER 4X4 (GAUZE/BANDAGES/DRESSINGS) IMPLANT
DRSG MEPILEX BORDER 4X8 (GAUZE/BANDAGES/DRESSINGS) ×3 IMPLANT
DURAPREP 26ML APPLICATOR (WOUND CARE) ×3 IMPLANT
ELECT REM PT RETURN 15FT ADLT (MISCELLANEOUS) ×3 IMPLANT
GAUZE SPONGE 4X4 12PLY STRL (GAUZE/BANDAGES/DRESSINGS) ×3 IMPLANT
GLOVE BIO SURGEON STRL SZ7.5 (GLOVE) ×3 IMPLANT
GLOVE BIO SURGEON STRL SZ8 (GLOVE) ×3 IMPLANT
GLOVE BIOGEL PI IND STRL 7.0 (GLOVE) ×1 IMPLANT
GLOVE BIOGEL PI IND STRL 8 (GLOVE) ×2 IMPLANT
GLOVE BIOGEL PI INDICATOR 7.0 (GLOVE) ×2
GLOVE BIOGEL PI INDICATOR 8 (GLOVE) ×4
GLOVE SURG SS PI 7.0 STRL IVOR (GLOVE) ×3 IMPLANT
GOWN STRL REUS W/TWL LRG LVL3 (GOWN DISPOSABLE) ×9 IMPLANT
KIT BASIN OR (CUSTOM PROCEDURE TRAY) ×3 IMPLANT
KIT TURNOVER KIT A (KITS) IMPLANT
MANIFOLD NEPTUNE II (INSTRUMENTS) ×3 IMPLANT
NDL MA TROC 1/2 (NEEDLE) ×1 IMPLANT
NDL SAFETY ECLIPSE 18X1.5 (NEEDLE) ×2 IMPLANT
NEEDLE HYPO 18GX1.5 SHARP (NEEDLE) ×6
NEEDLE MA TROC 1/2 (NEEDLE) ×3 IMPLANT
NS IRRIG 1000ML POUR BTL (IV SOLUTION) ×3 IMPLANT
PACK TOTAL JOINT (CUSTOM PROCEDURE TRAY) ×3 IMPLANT
PASSER SUT SWANSON 36MM LOOP (INSTRUMENTS) IMPLANT
PENCIL SMOKE EVACUATOR COATED (MISCELLANEOUS) ×2 IMPLANT
PROTECTOR NERVE ULNAR (MISCELLANEOUS) ×3 IMPLANT
STRIP CLOSURE SKIN 1/2X4 (GAUZE/BANDAGES/DRESSINGS) ×3 IMPLANT
SUT ETHIBOND NAB CT1 #1 30IN (SUTURE) IMPLANT
SUT MNCRL AB 4-0 PS2 18 (SUTURE) ×3 IMPLANT
SUT STRATAFIX 0 PDS 27 VIOLET (SUTURE) ×3
SUT VIC AB 0 CT1 36 (SUTURE) ×2 IMPLANT
SUT VIC AB 2-0 CT1 27 (SUTURE) ×6
SUT VIC AB 2-0 CT1 TAPERPNT 27 (SUTURE) ×2 IMPLANT
SUTURE STRATFX 0 PDS 27 VIOLET (SUTURE) IMPLANT
SYR 20ML LL LF (SYRINGE) ×3 IMPLANT
SYR 50ML LL SCALE MARK (SYRINGE) ×3 IMPLANT
TOWEL OR 17X26 10 PK STRL BLUE (TOWEL DISPOSABLE) ×3 IMPLANT

## 2019-09-17 NOTE — Anesthesia Preprocedure Evaluation (Signed)
Anesthesia Evaluation  Patient identified by MRN, date of birth, ID band Patient awake    Reviewed: Allergy & Precautions, NPO status , Patient's Chart, lab work & pertinent test results  Airway Mallampati: I  TM Distance: >3 FB Neck ROM: Full    Dental no notable dental hx.    Pulmonary COPD, Current Smoker,    Pulmonary exam normal breath sounds clear to auscultation       Cardiovascular hypertension, Pt. on medications Normal cardiovascular exam Rhythm:Regular Rate:Normal     Neuro/Psych Anxiety Depression TIA   GI/Hepatic GERD  Medicated and Controlled,  Endo/Other    Renal/GU      Musculoskeletal   Abdominal   Peds  Hematology   Anesthesia Other Findings   Reproductive/Obstetrics                             Anesthesia Physical  Anesthesia Plan  ASA: II  Anesthesia Plan: General   Post-op Pain Management:    Induction: Intravenous  PONV Risk Score and Plan: 2 and Ondansetron, Treatment may vary due to age or medical condition and Midazolam  Airway Management Planned: LMA  Additional Equipment:   Intra-op Plan:   Post-operative Plan: Extubation in OR  Informed Consent: I have reviewed the patients History and Physical, chart, labs and discussed the procedure including the risks, benefits and alternatives for the proposed anesthesia with the patient or authorized representative who has indicated his/her understanding and acceptance.     Dental advisory given  Plan Discussed with: CRNA and Surgeon  Anesthesia Plan Comments:         Anesthesia Quick Evaluation

## 2019-09-17 NOTE — Transfer of Care (Signed)
Immediate Anesthesia Transfer of Care Note  Patient: Emily Leonard  Procedure(s) Performed: Right hip bursectomy (Right Hip)  Patient Location: PACU  Anesthesia Type:General  Level of Consciousness: awake, alert  and oriented  Airway & Oxygen Therapy: Patient Spontanous Breathing and Patient connected to face mask oxygen  Post-op Assessment: Report given to RN and Post -op Vital signs reviewed and stable  Post vital signs: Reviewed and stable  Last Vitals:  Vitals Value Taken Time  BP 109/64 09/17/19 1606  Temp    Pulse 63 09/17/19 1610  Resp 12 09/17/19 1610  SpO2 100 % 09/17/19 1610  Vitals shown include unvalidated device data.  Last Pain:  Vitals:   09/17/19 1405  TempSrc: Oral  PainSc:       Patients Stated Pain Goal: 5 (0000000 [id number])  Complications: No apparent anesthesia complications

## 2019-09-17 NOTE — Discharge Instructions (Addendum)
Dr. Gaynelle Arabian Total Joint Specialist [facility] [address] [place], [place] [address] [phone]  Bursectomy Discharge Instructions  HOME CARE INSTRUCTIONS  Remove items at home which could result in a fall. This includes throw rugs or furniture in walking pathways.   ICE to the affected hip every three hours for 30 minutes at a time and then as needed for pain and swelling.  Continue to use ice on the hip for pain and swelling from surgery. You may notice swelling that will progress down to the foot and ankle.  This is normal after surgery.  Elevate the leg when you are not up walking on it.    Continue to use the breathing machine which will help keep your temperature down.  It is common for your temperature to cycle up and down following surgery, especially at night when you are not up moving around and exerting yourself.  The breathing machine keeps your lungs expanded and your temperature down. Sit on high chairs which makes it easier to stand.  Sit on chairs with arms. Use the chair arms to help push yourself up when arising.   Use your walker for first several days until comfortable ambulating.  DIET You may resume your previous home diet once your are discharged from the hospital.  DRESSING / WOUND CARE / SHOWERING  You may start showering three days following surgery but do not submerge the incision under water.Just pat the incision dry and apply a dry gauze dressing on daily. Change the surgical dressing daily and reapply a dry dressing each time.  ACTIVITY Walk with your walker as instructed. Use walker as long as suggested by your caregivers. Avoid periods of inactivity such as sitting longer than an hour when not asleep. This helps prevent blood clots.  You may resume a sexual relationship in one month or when given the OK by your doctor.  You may return to work once you are cleared by your doctor.  Do not drive a car for 6 weeks or until  released by you surgeon.  Do not drive while taking narcotics.  WEIGHT BEARING Weight bearing as tolerated  POSTOPERATIVE CONSTIPATION PROTOCOL Constipation - defined medically as fewer than three stools per week and severe constipation as less than one stool per week.  One of the most common issues patients have following surgery is constipation.  Even if you have a regular bowel pattern at home, your normal regimen is likely to be disrupted due to multiple reasons following surgery.  Combination of anesthesia, postoperative narcotics, change in appetite and fluid intake all can affect your bowels.  In order to avoid complications following surgery, here are some recommendations in order to help you during your recovery period.  Colace (docusate) - Pick up an over-the-counter form of Colace or another stool softener and take twice a day as long as you are requiring postoperative pain medications.  Take with a full glass of water daily.  If you experience loose stools or diarrhea, hold the colace until you stool forms back up.  If your symptoms do not get better within 1 week or if they get worse, check with your doctor.  Dulcolax (bisacodyl) - Pick up over-the-counter and take as directed by the product packaging as needed to assist with the movement of your bowels.  Take with a full glass of water.  Use this product as needed if not relieved by Colace only.   MiraLax (polyethylene glycol) - Pick up  over-the-counter to have on hand.  MiraLax is a solution that will increase the amount of water in your bowels to assist with bowel movements.  Take as directed and can mix with a glass of water, juice, soda, coffee, or tea.  Take if you go more than two days without a movement. Do not use MiraLax more than once per day. Call your doctor if you are still constipated or irregular after using this medication for 7 days in a row.  If you continue to have problems with postoperative constipation, please  contact the office for further assistance and recommendations.  If you experience "the worst abdominal pain ever" or develop nausea or vomiting, please contact the office immediatly for further recommendations for treatment.  ITCHING  If you experience itching with your medications, try taking only a single pain pill, or even half a pain pill at a time.  You can also use Benadryl over the counter for itching or also to help with sleep.   TED HOSE STOCKINGS Wear the elastic stockings on both legs for three weeks following surgery during the day but you may remove then at night for sleeping.  MEDICATIONS See your medication summary on the After Visit Summary that the nursing staff will review with you prior to discharge.  You may have some home medications which will be placed on hold until you complete the course of blood thinner medication.  It is important for you to complete the blood thinner medication as prescribed by your surgeon.  Continue your approved medications as instructed at time of discharge.  PRECAUTIONS If you experience chest pain or shortness of breath - call 911 immediately for transfer to the hospital emergency department.  If you develop a fever greater that 101 F, purulent drainage from wound, increased redness or drainage from wound, foul odor from the wound/dressing, or calf pain - CONTACT YOUR SURGEON.                                                   FOLLOW-UP APPOINTMENTS Make sure you keep all of your appointments after your operation with your surgeon and caregivers. You should call the office at the above phone number and make an appointment for approximately two weeks after the date of your surgery or on the date instructed by your surgeon outlined in the "After Visit Summary".   Pick up stool softner and laxative for home use following surgery while on pain medications. Do not submerge incision under water. Please use good hand washing techniques while changing  dressing each day. May shower starting three days after surgery. Please use a clean towel to pat the incision dry following showers. Continue to use ice for pain and swelling after surgery. Do not use any lotions or creams on the incision until instructed by your surgeon.

## 2019-09-17 NOTE — Anesthesia Postprocedure Evaluation (Signed)
Anesthesia Post Note  Patient: Emily Leonard  Procedure(s) Performed: Right hip bursectomy (Right Hip)     Patient location during evaluation: PACU Anesthesia Type: General Level of consciousness: awake and alert Pain management: pain level controlled Vital Signs Assessment: post-procedure vital signs reviewed and stable Respiratory status: spontaneous breathing, nonlabored ventilation and respiratory function stable Cardiovascular status: blood pressure returned to baseline and stable Postop Assessment: no apparent nausea or vomiting Anesthetic complications: no    Last Vitals:  Vitals:   09/17/19 1630 09/17/19 1645  BP: 106/60 104/60  Pulse: (!) 118 65  Resp: 15 14  Temp: (!) 36.4 C (!) 36.4 C  SpO2: 95% 90%    Last Pain:  Vitals:   09/17/19 1645  TempSrc:   PainSc: [place]

## 2019-09-17 NOTE — Interval H&P Note (Signed)
History and Physical Interval Note:  09/17/2019 1:52 PM  Shaindy S Stroebel  has presented today for surgery, with the diagnosis of right hip pain.  The various methods of treatment have been discussed with the patient and family. After consideration of risks, benefits and other options for treatment, the patient has consented to  Procedure(s): Right hip bursectomy (Right) as a surgical intervention.  The patient's history has been reviewed, patient examined, no change in status, stable for surgery.  I have reviewed the patient's chart and labs.  Questions were answered to the patient's satisfaction.     Pilar Plate Adelei Scobey

## 2019-09-17 NOTE — H&P (Signed)
CC- Emily Leonard is a 73 y.o. female who presents with right hip pain  Hip Pain: Patient complains of right hip pain. Onset of the symptoms was several months ago. Inciting event: none. Current symptoms include lateral hip pain . Associated symptoms: none. Aggravating symptoms: going up and down stairs, lateral movements, rising after sitting and lying on right side. . Evaluation to date: MRI with gluteal tendon tear.  Treatment to date: home exercise program, which has been not very effective and cortisone injection which provided temporary relief.  Past Medical History:  Diagnosis Date  . Anxiety   . Chronic pain syndrome    Dr Jamse [place]   . COPD (chronic obstructive pulmonary disease) ([place])   . Degenerative disc disease   . Degenerative joint disease   . Depression   . Essential hypertension, benign   . GERD (gastroesophageal reflux disease)   . Hyperlipidemia   . Hypothyroidism   . Insomnia   . Osteoarthritis   . Palpitations   . Restless leg syndrome   . TIA (transient ischemic attack) unsure    Past Surgical History:  Procedure Laterality Date  . ABDOMINAL HYSTERECTOMY    . ANTERIOR CERVICAL DECOMP/DISCECTOMY FUSION N/A 05/07/2017   Procedure: ANTERIOR CERVICAL DECOMPRESSION/DISCECTOMY FUSION CERVICAL FOUR- CERVICAL FIVE, CERVICAL FIVE- CERVICAL SIX, CERVICAL SIX- CERVICAL SEVEN;  Surgeon: Newman Pies, MD;  Location: [place];  Service: Neurosurgery;  Laterality: N/A;  . ANTERIOR CERVICAL DECOMP/DISCECTOMY FUSION N/A 06/21/2017   Procedure: Revision Anterior Instrumentation Cervical four-seven;  Surgeon: Newman Pies, MD;  Location: [place];  Service: Neurosurgery;  Laterality: N/A;  . APPENDECTOMY    . CATARACT EXTRACTION, BILATERAL    . CERVICAL SPINE SURGERY    . COLONOSCOPY  02/2013  . HEMORRHOID SURGERY  03/2013  . LUMBAR SPINE SURGERY     x 3  . POSTERIOR CERVICAL FUSION/FORAMINOTOMY N/A 06/21/2017   Procedure: POSTERIOR CERVICAL FUSION WITH LATERAL MASS FIXATION  CERVICAL FOUR- CERVICAL FIVE, CERVICAL FIVE- CERVICAL SIX, CERVICAL SIX- CERVICAL SEVEN;  Surgeon: Newman Pies, MD;  Location: [place];  Service: Neurosurgery;  Laterality: N/A;    Prior to Admission medications   Medication Sig Start Date End Date Taking? Authorizing Provider  albuterol (VENTOLIN HFA) 108 (90 Base) MCG/ACT inhaler Inhale 2 puffs into the lungs every 6 (six) hours as needed for wheezing or shortness of breath.   Yes [provider]  alendronate (FOSAMAX) 70 MG tablet Take 70 mg by mouth once a week. Saturday 05/05/11  Yes [provider]  aspirin EC 81 MG tablet Take 81 mg by mouth daily.   Yes [provider]  Calcium Carbonate-Vitamin D (CALCIUM-VITAMIN D3) 600-125 MG-UNIT TABS Take 600 mg by mouth daily.   Yes [provider]  diltiazem (CARDIZEM) 30 MG tablet TAKE 1 TABLET BY MOUTH AT BEDTIME Patient taking differently: Take 30 mg by mouth daily.  08/06/18  Yes Minus Breeding, MD  DULoxetine (CYMBALTA) 30 MG capsule Take 1 capsule (30 mg total) by mouth daily. Patient taking differently: Take 30 mg by mouth 2 (two) times daily.  09/04/14  Yes Kirsteins, Luanna Salk, MD  hydrochlorothiazide 25 MG tablet Take 25 mg by mouth daily.    Yes [provider]  HYDROcodone-acetaminophen (NORCO/VICODIN) 5-325 MG tablet Take 1 tablet by mouth every 6 (six) hours as needed for moderate pain.   Yes [provider]  levothyroxine (SYNTHROID, LEVOTHROID) 100 MCG tablet Take 100 mcg by mouth daily before breakfast.  11/12/16  Yes [provider]  lidocaine (LIDODERM) 5 % Place 2 patches onto the skin daily. Remove & Discard patch within 12 hours or as directed by MD Patient taking differently: Place 2 patches onto the skin daily as needed (Lower back and shoulder). Remove & Discard patch within 12 hours or as directed by MD 08/13/18  Yes Kirsteins, Luanna Salk, MD  meloxicam (MOBIC) 7.5 MG tablet Take 7.5 mg by mouth daily. 07/26/19   Yes [provider]  naloxone (NARCAN) nasal spray 4 mg/0.1 mL Place 1 spray into the nose once. 05/12/19 05/11/20 Yes [provider]  pravastatin (PRAVACHOL) 80 MG tablet TAKE 1 TABLET BY MOUTH AT BEDTIME Patient taking differently: Take 40 mg by mouth daily.  12/05/18  Yes Minus Breeding, MD  pregabalin (LYRICA) 50 MG capsule Take 1 capsule (50 mg total) by mouth 2 (two) times daily. 06/06/19  Yes Kirsteins, Luanna Salk, MD  traZODone (DESYREL) 150 MG tablet Take 150 mg by mouth at bedtime.  11/09/15  Yes [provider]  GAVILYTE-N WITH FLAVOR PACK 420 G solution Take 4,000 mLs by mouth once.  02/10/13   [provider]    Physical Examination: General appearance - alert, well appearing, and in no distress Mental status - alert, oriented to person, place, and time Chest - clear to auscultation, no wheezes, rales or rhonchi, symmetric air entry Heart - normal rate, regular rhythm, normal S1, S2, no murmurs, rubs, clicks or gallops Abdomen - soft, nontender, nondistended, no masses or organomegaly Neurological - alert, oriented, normal speech, no focal findings or movement disorder noted  A right hip exam was performed. GENERAL: no acute distress SKIN: intact SWELLING: none WARMTH: no warmth TENDERNESS: maximal at greater trochanter ROM: normal STRENGTH: 4/5 abduction otherwise normal GAIT: antalgic  ASSESSMENT: Right hip intractable bursitis and gluteal tendon tear  Plan Right hip bursectomy and gluteal tendon tear. Discussed in detail with patient who elects to proceed  Dione Plover. Kaisei Gilbo, MD    09/17/2019, 7:55 AM

## 2019-09-17 NOTE — Anesthesia Procedure Notes (Signed)
Procedure Name: LMA Insertion Date/Time: 09/17/2019 3:05 PM Performed by: [place] Deshane Cotroneo C, CRNA Pre-anesthesia Checklist: Patient identified, Emergency Drugs available, Suction available and Patient being monitored Patient Re-evaluated:Patient Re-evaluated prior to induction Oxygen Delivery Method: Circle system utilized Preoxygenation: Pre-oxygenation with 100% oxygen Induction Type: IV induction Ventilation: Mask ventilation without difficulty LMA: LMA inserted LMA Size: 4.0 Number of attempts: 1 Airway Equipment and Method: Bite block Placement Confirmation: positive ETCO2 Tube secured with: Tape Dental Injury: Teeth and Oropharynx as per pre-operative assessment

## 2019-09-17 NOTE — Brief Op Note (Signed)
09/17/2019  4:01 PM  PATIENT:  Emily Leonard  73 y.o. female  PRE-OPERATIVE DIAGNOSIS:  right hip intractable bursitis  POST-OPERATIVE DIAGNOSIS:  right hip intractable bursitis  PROCEDURE:  Procedure(s): Right hip bursectomy (Right)  SURGEON:  Surgeon(s) and Role:    Gaynelle Arabian, MD - Primary  PHYSICIAN ASSISTANT:   ASSISTANTS: Theresa Duty, PA-C   ANESTHESIA:   general  EBL:  10 ml  BLOOD ADMINISTERED:none  DRAINS: none   LOCAL MEDICATIONS USED:  OTHER Exparel  COUNTS:  YES  TOURNIQUET:  * No tourniquets in log *  DICTATION: .Other Dictation: Dictation Number [id number]  PLAN OF CARE: Discharge to home after PACU  PATIENT DISPOSITION:  PACU - hemodynamically stable.

## 2019-09-18 ENCOUNTER — Encounter (HOSPITAL_COMMUNITY): Payer: Self-pay | Admitting: Orthopedic Surgery

## 2019-09-18 NOTE — Op Note (Signed)
NAME: Emily Leonard, WALKUP MEDICAL RECORD [id number] ACCOUNT [id number] DATE OF BIRTH:[birth year] FACILITY: [facility] LOCATION: [facility] PHYSICIAN:Ailine Hefferan Zella Ball, MD  OPERATIVE REPORT  DATE OF PROCEDURE:  09/17/2019  PREOPERATIVE DIAGNOSIS:  Right hip intractable bursitis with calcific tendinitis.  POSTOPERATIVE DIAGNOSIS:  Right hip intractable bursitis with calcific tendinitis.  PROCEDURE:  Right hip bursectomy.  SURGEON:  Gaynelle Arabian, MD  ASSISTANT:  Theresa Duty, PA-C  ANESTHESIA:  General.  ESTIMATED BLOOD LOSS:  Minimal.  DRAINS:  None.  COMPLICATIONS:  None.  CONDITION:  Stable to recovery.  BRIEF CLINICAL NOTE:  The patient is a 73 year old female who has intractable bursitis of the right hip with severe pain and dysfunction.  She has had nonoperative management including injections and exercise and has had persistent discomfort.  MRI  showed calcific deposits in the gluteal tendons and showed significant bursitis.  There was questionable gluteal tendon tear.  She presents now for bursectomy, possible tendon repair if there is a tear.  PROCEDURE IN DETAIL:  After successful administration of general anesthetic, the patient was placed in the left lateral decubitus position with the right side up and held with a hip positioner.  Right thigh was then prepped and draped in the usual  sterile fashion.  Small vertical incision was made centered over the tip of the greater trochanter.  Skin was cut with a 10 blade through the subcutaneous tissue to the level of the fascia lata, which was incised in line with the skin incision.  There is  a large calcified bursa present which was very thickened and appears inflamed.  The bursa is removed with electrocautery.  There was a large calcium deposit within the substance of the gluteus medius tendon.  I was able to remove the calcium deposit  without disrupting the tendon.  I palpated throughout the extent of the gluteus medius tendon.   I did not see any tears or abnormalities.  Meticulous hemostasis was achieved and then the wound was copiously irrigated with saline solution.  Fascia lata  was closed with interrupted 0 Vicryl and a running 0 Stratafix.  Small triangular resection of tissue was removed over the tip of the greater trochanter to prevent further friction in this area.  20 mL of Exparel mixed with 30 mL of saline was injected  into the fascia lata, subcutaneous tissues.  Subcutaneous was then closed with interrupted 2-0 Vicryl and subcuticular running 4-0 Monocryl.  Incisions were cleaned and dried and Steri-Strips and a bulky sterile dressing are applied.  She was then  awakened and transported to recovery in stable condition.  CN/NUANCE  D:09/17/2019 T:09/18/2019 JOB:[id number]

## 2019-09-19 ENCOUNTER — Encounter: Payer: Medicare HMO | Admitting: Physical Medicine & Rehabilitation

## 2019-10-21 ENCOUNTER — Encounter: Payer: Medicare HMO | Admitting: Physical Medicine & Rehabilitation

## 2019-11-11 ENCOUNTER — Other Ambulatory Visit: Payer: Self-pay

## 2019-11-11 ENCOUNTER — Encounter: Payer: Medicare HMO | Attending: Physical Medicine & Rehabilitation | Admitting: Physical Medicine & Rehabilitation

## 2019-11-11 ENCOUNTER — Encounter: Payer: Self-pay | Admitting: Physical Medicine & Rehabilitation

## 2019-11-11 VITALS — BP 153/83 | HR 60 | Temp 97.5°F | Ht 63.0 in | Wt 135.0 lb

## 2019-11-11 DIAGNOSIS — Z5181 Encounter for therapeutic drug level monitoring: Secondary | ICD-10-CM | POA: Diagnosis present

## 2019-11-11 DIAGNOSIS — G894 Chronic pain syndrome: Secondary | ICD-10-CM | POA: Insufficient documentation

## 2019-11-11 DIAGNOSIS — Z79891 Long term (current) use of opiate analgesic: Secondary | ICD-10-CM | POA: Insufficient documentation

## 2019-11-11 MED ORDER — PREGABALIN 50 MG PO CAPS: 50.0000 mg | ORAL_CAPSULE | Freq: Two times a day (BID) | ORAL | 5 refills | Status: DC

## 2019-11-11 NOTE — Progress Notes (Signed)
Black Dot Removed, no longer on controlled medications per Dr. Letta Pate

## 2019-11-11 NOTE — Progress Notes (Signed)
Left Trochanteric bursa injection With ultrasound guidance Originally scheduled for bilateral injections however the patient underwent a right greater trochanteric bursectomy approximately 1 month ago  Indication Trochanteric bursitis. Exam has tenderness over the greater trochanter of the hip. Pain has not responded to conservative care such as exercise therapy and oral medications. Pain interferes with sleep or with mobility Informed consent was obtained after describing risks and benefits of the procedure with the patient these include bleeding bruising and infection. Patient has signed written consent form. Patient placed in a lateral decubitus position with the affected hip superior. Point of maximal pain was palpated marked and prepped with Betadine and entered with a needle to bone contact. Needle slightly withdrawn then 6mg  of betamethasone with 4 cc 1% lidocaine were injected. Patient tolerated procedure well. Post procedure instructions given.

## 2019-11-11 NOTE — Patient Instructions (Signed)
Hip bursa injection on left may repeat in 3 months

## 2019-12-10 ENCOUNTER — Other Ambulatory Visit: Payer: Self-pay | Admitting: Cardiology

## 2019-12-29 ENCOUNTER — Telehealth: Payer: Self-pay

## 2019-12-29 NOTE — Telephone Encounter (Signed)
Patient called requesting an injection. Bilateral lower back pain going down left leg to heel. Is it okay to schedule injection and what injection to schedule?

## 2019-12-29 NOTE — Telephone Encounter (Signed)
Schedule for left S1 epidural

## 2020-01-01 ENCOUNTER — Encounter: Payer: Medicare HMO | Attending: Physical Medicine & Rehabilitation | Admitting: Physical Medicine & Rehabilitation

## 2020-01-01 ENCOUNTER — Other Ambulatory Visit: Payer: Self-pay

## 2020-01-01 ENCOUNTER — Encounter: Payer: Self-pay | Admitting: Physical Medicine & Rehabilitation

## 2020-01-01 VITALS — BP 144/88 | HR 55 | Temp 97.9°F | Ht 64.0 in | Wt 134.0 lb

## 2020-01-01 DIAGNOSIS — Z5181 Encounter for therapeutic drug level monitoring: Secondary | ICD-10-CM | POA: Insufficient documentation

## 2020-01-01 DIAGNOSIS — G894 Chronic pain syndrome: Secondary | ICD-10-CM | POA: Diagnosis present

## 2020-01-01 DIAGNOSIS — M5417 Radiculopathy, lumbosacral region: Secondary | ICD-10-CM | POA: Diagnosis not present

## 2020-01-01 DIAGNOSIS — Z79891 Long term (current) use of opiate analgesic: Secondary | ICD-10-CM | POA: Diagnosis present

## 2020-01-01 NOTE — Patient Instructions (Signed)
Please call to make  appt if you'd like me to look into your symptoms of hand cramping, you may need to get a EMG/Nerve conduction test.  You can ask your primary MD to run some bloodwork first

## 2020-01-01 NOTE — Progress Notes (Signed)
  PROCEDURE RECORD [facility]   Name: Emily Leonard DOB:[birth year] MRN: [id number]  Date:01/01/2020  Physician: Alysia Penna, MD    Nurse/CMA: Taci Sterling CMA  Allergies:  Allergies  Allergen Reactions  . Erythromycin Swelling  . Latex Other (See Comments)    Blisters  . Morphine Sulfate Other (See Comments)    Hallucinations/deep sleep  . Clozapine Rash  . Fentanyl Nausea Only    "Feels like you are a hundred miles away when talking to her"    Consent Signed: Yes.    Is patient diabetic? No.  CBG today? NA  Pregnant: No. LMP: No LMP recorded. Patient has had a hysterectomy. (age 49-55)  Anticoagulants: no Anti-inflammatory: yes (aspirin) Antibiotics: no  Procedure: Left S1 Transforminal ESI  Position: Prone   Start Time: 10:43am End Time:10:48am  Fluoro Time: 18  RN/CMA Deanda Ruddell CMA Lee CMA    Time 1022am 10;53am    BP 144/88 124/74    Pulse 55 58    Respirations 16 16    O2 Sat 95 93    S/S 6 6    Pain Level 9/10 7/10     D/C home with husband, [name], D/C instructions reviewed, and sits independently.

## 2020-01-08 ENCOUNTER — Telehealth: Payer: Self-pay

## 2020-01-08 NOTE — Telephone Encounter (Signed)
Patient called stating injection is not helping.

## 2020-01-08 NOTE — Telephone Encounter (Signed)
We can try a caudal epidural on her but must wait until 3 months from the prior injection

## 2020-01-09 NOTE — Telephone Encounter (Signed)
Patient called asking for Tramadol prescription because she can't get an appt until 03/09/2020. I called patient and explained to her that Dr. Letta Pate is unable to prescribe to her due to her episode with Fentanyl patch.

## 2020-01-09 NOTE — Telephone Encounter (Signed)
Called and notified , pt needs an appt for epidural injection  set up in 3 months.

## 2020-02-04 ENCOUNTER — Telehealth: Payer: Self-pay | Admitting: *Deleted

## 2020-02-04 NOTE — Telephone Encounter (Signed)
Emily Leonard is asking for [name] to write a letter to [name] @[name] regarding her tramadol.

## 2020-02-05 NOTE — Telephone Encounter (Signed)
spoke to patient over the phone.  She states Dr. Noberto Retort is basically asking for a letter that states that dr. Letta Pate is not going to write scripts for tramadol and that it is okay for Dr. Noberto Retort to do so. Officially documenting the exchange so to speak.

## 2020-02-05 NOTE — Telephone Encounter (Signed)
What is the letter for?

## 2020-02-10 ENCOUNTER — Ambulatory Visit: Payer: Medicare HMO | Admitting: Physical Medicine & Rehabilitation

## 2020-03-09 ENCOUNTER — Ambulatory Visit: Payer: Medicare HMO | Admitting: Physical Medicine & Rehabilitation

## 2020-03-25 ENCOUNTER — Encounter: Payer: Self-pay | Admitting: Physical Medicine & Rehabilitation

## 2020-03-25 ENCOUNTER — Other Ambulatory Visit: Payer: Self-pay

## 2020-03-25 ENCOUNTER — Encounter: Payer: Medicare HMO | Attending: Physical Medicine & Rehabilitation | Admitting: Physical Medicine & Rehabilitation

## 2020-03-25 VITALS — BP 147/87 | HR 56 | Temp 97.2°F | Ht 64.0 in | Wt 135.0 lb

## 2020-03-25 DIAGNOSIS — Z5181 Encounter for therapeutic drug level monitoring: Secondary | ICD-10-CM | POA: Diagnosis present

## 2020-03-25 DIAGNOSIS — M5417 Radiculopathy, lumbosacral region: Secondary | ICD-10-CM | POA: Diagnosis not present

## 2020-03-25 DIAGNOSIS — G894 Chronic pain syndrome: Secondary | ICD-10-CM | POA: Diagnosis present

## 2020-03-25 DIAGNOSIS — Z79891 Long term (current) use of opiate analgesic: Secondary | ICD-10-CM | POA: Insufficient documentation

## 2020-03-25 MED ORDER — PREGABALIN 50 MG PO CAPS: 50.0000 mg | ORAL_CAPSULE | Freq: Two times a day (BID) | ORAL | 5 refills | Status: DC

## 2020-03-25 NOTE — Progress Notes (Signed)
  PROCEDURE RECORD [facility]   Name: Emily Leonard DOB:[birth year] MRN: [id number]  Date:03/25/2020  Physician: Alysia Penna, MD    Nurse/CMA: Truman Hayward, CMA  Allergies:  Allergies  Allergen Reactions  . Erythromycin Swelling  . Latex Other (See Comments)    Blisters  . Morphine Sulfate Other (See Comments)    Hallucinations/deep sleep  . Clozapine Rash  . Fentanyl Nausea Only    "Feels like you are a hundred miles away when talking to her"    Consent Signed: Yes.    Is patient diabetic? No.  CBG today?   Pregnant: No. LMP: No LMP recorded. Patient has had a hysterectomy. (age 62-55)  Anticoagulants: no Anti-inflammatory: no Antibiotics: no  Procedure: Caudal Epidural Steroid Injection Position: Prone Start Time:12:18pm  End Time:12:20pm  Fluoro Time: 20  RN/CMA Truman Hayward, CMA [name], CMA    Time 12:00 pm 12:28pm    BP 147/87 163/73    Pulse 56 60    Respirations 16 16    O2 Sat 96 93    S/S 6 6    Pain Level 10/10 7/10     D/C home with husband, [name], D/C instructions reviewed, and sits independently.

## 2020-03-25 NOTE — Patient Instructions (Signed)

## 2020-03-25 NOTE — Progress Notes (Signed)
Caudal epidural injection Informed consent was obtained after describing risks and benefits of the procedure the patient is include bleeding bruising and infection she elects to proceed and has given written consent. Patient placed prone on fluoroscopy table Betadine prep Sterile drape 25-gauge 1.5 inch needle was used to anesthetize the skin and subcutaneous tissue after identifying the sacral hiatus on fluoroscopic AP views Then a 22-gauge 1.5 inch needle was inserted into the sacral hiatus. Lateral views were utilized to maneuver the needle into the sacral canal Under AP views 3 cc of Omnipaque 180 injected demonstrating appropriate spread of injectate Then a solution containing 12 mg of Celestone and 3 cc of 1% MPF of lidocaine were injected. Patient tolerated procedure well Post procedure instructions given

## 2020-04-01 ENCOUNTER — Telehealth: Payer: Self-pay | Admitting: *Deleted

## 2020-04-01 NOTE — Telephone Encounter (Signed)
Mrs Biggar called and reports that the shot she received last week is not working and "I need some help". Please advise.

## 2020-04-01 NOTE — Telephone Encounter (Signed)
Recommend referral to physical therapy.  I think she lives close to [facility].

## 2020-04-05 NOTE — Telephone Encounter (Signed)
She declined therapy, said she went through it for 2 weeks and didn't do anything.

## 2020-04-08 ENCOUNTER — Ambulatory Visit: Payer: Medicare HMO | Admitting: Physical Medicine & Rehabilitation

## 2020-06-23 ENCOUNTER — Ambulatory Visit: Payer: Medicare HMO | Admitting: Orthopedic Surgery

## 2020-06-24 ENCOUNTER — Telehealth: Payer: Self-pay | Admitting: Physical Medicine and Rehabilitation

## 2020-06-24 NOTE — Telephone Encounter (Signed)
Yeah, so Kirsteins sent to me to do the cervical injection in 2018. She has seen him as late as June of this year. She needs to see Kirsteins in follow up for her hip unless he can not do and wants [place] to do. Alternatively she can OV with one of ortho docs for her hip.

## 2020-06-24 NOTE — Telephone Encounter (Signed)
Patient states that she cannot see Dr. Letta Pate until December. She states that she has seen Dr. Sharol Given in our office before. I scheduled her for an OV with him for her right hip pain.

## 2020-06-24 NOTE — Telephone Encounter (Signed)
Patient saw you in 2018 for cervical ESI. She has seen Dr. Letta Pate in the past for low back pain and trochanteric bursitis. Please advise.

## 2020-06-24 NOTE — Telephone Encounter (Signed)
Patient called requesting an appt for right hip injection. This patient hasn't been seen by Dr. Ernestina Patches since 2018. Please call patient to set appt. Patient phone number is [phone].

## 2020-07-05 ENCOUNTER — Ambulatory Visit: Payer: Medicare HMO | Admitting: Orthopedic Surgery

## 2020-08-08 NOTE — Progress Notes (Signed)
Cardiology Office Note   Date:  08/09/2020   ID:  Emily Leonard, DOB [birth year], MRN [id number]  PCP:  Jene Every, MD  Cardiologist:   No primary care provider on file.   Chief Complaint  Patient presents with  . Palpitations      History of Present Illness: Emily Leonard is a 74 y.o. female who presents for follow up of chest discomfort and palpitations. She's had a stress test in 2015 which was unremarkable.  Since I last saw her she has had no new cardiovascular complaints.  Her biggest issue has been joint pains.  She needs hip injections.  She is not having any new shortness of breath, PND or orthopnea.  He has some chronic sporadic palpitations but no presyncope or syncope.  I do see that she had a CT of her chest for screening because she is a smoker and they did note aortic atherosclerosis and multivessel coronary disease.     Past Medical History:  Diagnosis Date  . Anxiety   . Chronic pain syndrome    Dr Jamse [place]   . COPD (chronic obstructive pulmonary disease) ([place])   . Degenerative disc disease   . Degenerative joint disease   . Depression   . Essential hypertension, benign   . GERD (gastroesophageal reflux disease)   . Hyperlipidemia   . Hypothyroidism   . Insomnia   . Osteoarthritis   . Palpitations   . Restless leg syndrome   . TIA (transient ischemic attack) unsure    Past Surgical History:  Procedure Laterality Date  . ABDOMINAL HYSTERECTOMY    . ANTERIOR CERVICAL DECOMP/DISCECTOMY FUSION N/A 05/07/2017   Procedure: ANTERIOR CERVICAL DECOMPRESSION/DISCECTOMY FUSION CERVICAL FOUR- CERVICAL FIVE, CERVICAL FIVE- CERVICAL SIX, CERVICAL SIX- CERVICAL SEVEN;  Surgeon: Newman Pies, MD;  Location: [facility];  Service: Neurosurgery;  Laterality: N/A;  . ANTERIOR CERVICAL DECOMP/DISCECTOMY FUSION N/A 06/21/2017   Procedure: Revision Anterior Instrumentation Cervical four-seven;  Surgeon: Newman Pies, MD;  Location: [facility];  Service: Neurosurgery;   Laterality: N/A;  . APPENDECTOMY    . CATARACT EXTRACTION, BILATERAL    . CERVICAL SPINE SURGERY    . COLONOSCOPY  02/2013  . EXCISION/RELEASE BURSA HIP Right 09/17/2019   Procedure: Right hip bursectomy;  Surgeon: Gaynelle Arabian, MD;  Location: [facility];  Service: Orthopedics;  Laterality: Right;  . HEMORRHOID SURGERY  03/2013  . LUMBAR SPINE SURGERY     x 3  . POSTERIOR CERVICAL FUSION/FORAMINOTOMY N/A 06/21/2017   Procedure: POSTERIOR CERVICAL FUSION WITH LATERAL MASS FIXATION CERVICAL FOUR- CERVICAL FIVE, CERVICAL FIVE- CERVICAL SIX, CERVICAL SIX- CERVICAL SEVEN;  Surgeon: Newman Pies, MD;  Location: [facility];  Service: Neurosurgery;  Laterality: N/A;     Current Outpatient Medications  Medication Sig Dispense Refill  . albuterol (VENTOLIN HFA) 108 (90 Base) MCG/ACT inhaler Inhale 2 puffs into the lungs every 6 (six) hours as needed for wheezing or shortness of breath.    Marland Kitchen alendronate (FOSAMAX) 70 MG tablet Take 70 mg by mouth once a week. Saturday    . aspirin EC 81 MG tablet Take 81 mg by mouth daily.    . Calcium Carbonate-Vitamin D (CALCIUM-VITAMIN D3) 600-125 MG-UNIT TABS Take 600 mg by mouth daily.    Marland Kitchen diltiazem (CARDIZEM) 30 MG tablet TAKE 1 TABLET BY MOUTH AT BEDTIME (Patient taking differently: Take 30 mg by mouth daily. ) 90 tablet 1  . DULoxetine (CYMBALTA) 30 MG capsule Take 1 capsule (30 mg total) by mouth  daily. (Patient taking differently: Take 30 mg by mouth 2 (two) times daily. ) 30 capsule 5  . hydrochlorothiazide 25 MG tablet Take 25 mg by mouth daily.     Marland Kitchen HYDROcodone-acetaminophen (NORCO/VICODIN) 5-325 MG tablet Take 1 tablet by mouth every 6 (six) hours as needed for moderate pain.    Marland Kitchen levothyroxine (SYNTHROID, LEVOTHROID) 100 MCG tablet Take 100 mcg by mouth daily before breakfast.   1  . lidocaine (LIDODERM) 5 % Place 2 patches onto the skin daily. Remove & Discard patch within 12 hours or as directed by MD (Patient taking differently: Place 2 patches onto the  skin daily as needed (Lower back and shoulder). Remove & Discard patch within 12 hours or as directed by MD) 60 patch 2  . meloxicam (MOBIC) 7.5 MG tablet Take 7.5 mg by mouth daily.    . pravastatin (PRAVACHOL) 80 MG tablet TAKE 1 TABLET BY MOUTH AT BEDTIME 90 tablet 2  . pregabalin (LYRICA) 50 MG capsule Take 1 capsule (50 mg total) by mouth 2 (two) times daily. 60 capsule 5  . traZODone (DESYREL) 150 MG tablet Take 150 mg by mouth at bedtime.   0  . GAVILYTE-N WITH FLAVOR PACK 420 G solution Take 4,000 mLs by mouth once.     Marland Kitchen tiZANidine (ZANAFLEX) 4 MG tablet Take 1 tablet (4 mg total) by mouth every 8 (eight) hours as needed for muscle spasms. 30 tablet 1   No current facility-administered medications for this visit.    Allergies:   Erythromycin, Latex, Morphine sulfate, Clozapine, and Fentanyl   ROS:  Please see the history of present illness.   Otherwise, review of systems are positive for none.   All other systems are reviewed and negative.    PHYSICAL EXAM: VS:  BP 132/82   Pulse (!) 55   Ht 5\' 4"  (1.626 m)   Wt 130 lb (59 kg)   SpO2 91%   BMI 22.31 kg/m  , BMI Body mass index is 22.31 kg/m. GENERAL:  Well appearing NECK:  No jugular venous distention, waveform within normal limits, carotid upstroke brisk and symmetric, no bruits, no thyromegaly LUNGS:  Clear to auscultation bilaterally CHEST:  Unremarkable HEART:  PMI not displaced or sustained,S1 and S2 within normal limits, no S3, no S4, no clicks, no rubs, no murmurs ABD:  Flat, positive bowel sounds normal in frequency in pitch, no bruits, no rebound, no guarding, no midline pulsatile mass, no hepatomegaly, no splenomegaly EXT:  2 plus pulses throughout, no edema, no cyanosis no clubbing   EKG:  EKG is ordered today. The ekg ordered today demonstrates sinus rhythm, rate 55, axis within normal limits, intervals within normal limits, no acute ST-T wave changes.   Recent Labs: 09/15/2019: ALT 19; BUN 20; Creatinine,  Ser 0.98; Hemoglobin 16.7; Platelets 291; Potassium 4.6; Sodium 139    Lipid Panel    Component Value Date/Time   CHOL  03/23/2009 0630    160        ATP III CLASSIFICATION:  <200     mg/dL   Desirable  200-239  mg/dL   Borderline High  >=240    mg/dL   High          TRIG 100 03/23/2009 0630   HDL 44 03/23/2009 0630   CHOLHDL 3.6 03/23/2009 0630   VLDL 20 03/23/2009 0630   LDLCALC  03/23/2009 0630    96        Total Cholesterol/HDL:CHD Risk Coronary Heart Disease Risk  Table                     Men   Women  1/2 Average Risk   3.4   3.3  Average Risk       5.0   4.4  2 X Average Risk   9.6   7.1  3 X Average Risk  23.4   11.0        Use the calculated Patient Ratio above and the CHD Risk Table to determine the patient's CHD Risk.        ATP III CLASSIFICATION (LDL):  <100     mg/dL   Optimal  100-129  mg/dL   Near or Above                    Optimal  130-159  mg/dL   Borderline  160-189  mg/dL   High  >190     mg/dL   Very High      Wt Readings from Last 3 Encounters:  08/09/20 130 lb (59 kg)  03/25/20 135 lb (61.2 kg)  01/01/20 134 lb (60.8 kg)      Other studies Reviewed: Additional studies/ records that were reviewed today include: Labs. Review of the above records demonstrates:  Please see elsewhere in the note.     ASSESSMENT AND PLAN:  PALPITATIONS:     These are baseline and not particularly problematic.  No change in therapy.   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  No further testing is indicated.  She needs primary risk reduction.  CORONARY CALCIUM: This was noted recently as above.  She also has aortic atherosclerosis.  For screening purposes I would like her to have a POET (Plain Old Exercise Treadmill)  DYSLIPIDEMIA:  LDL was mildly elevated at most recently in July 94 but her HDL was 78.  She does not really tolerate much in the way of statins and so I will leave her on the pravastatin.  TOBACCO ABUSE:   We discussed this again  today and the need for her to quit completely.   Current medicines are reviewed at length with the patient today.  The patient does not have concerns regarding medicines.  The following changes have been made:  no change  Labs/ tests ordered today include:   Orders Placed This Encounter  Procedures  . Exercise Tolerance Test  . EKG 12-Lead     Disposition:   FU with me in one year or based on the results of the tests ordered above.   Signed, Minus Breeding, MD  08/09/2020 10:25 AM    [facility]

## 2020-08-09 ENCOUNTER — Ambulatory Visit: Payer: Medicare HMO | Admitting: Cardiology

## 2020-08-09 ENCOUNTER — Encounter: Payer: Self-pay | Admitting: Cardiology

## 2020-08-09 ENCOUNTER — Other Ambulatory Visit: Payer: Self-pay

## 2020-08-09 VITALS — BP 132/82 | HR 55 | Ht 64.0 in | Wt 130.0 lb

## 2020-08-09 DIAGNOSIS — R931 Abnormal findings on diagnostic imaging of heart and coronary circulation: Secondary | ICD-10-CM

## 2020-08-09 DIAGNOSIS — Z720 Tobacco use: Secondary | ICD-10-CM | POA: Diagnosis not present

## 2020-08-09 DIAGNOSIS — R002 Palpitations: Secondary | ICD-10-CM | POA: Diagnosis not present

## 2020-08-09 DIAGNOSIS — I10 Essential (primary) hypertension: Secondary | ICD-10-CM

## 2020-08-09 DIAGNOSIS — E785 Hyperlipidemia, unspecified: Secondary | ICD-10-CM

## 2020-08-09 NOTE — Patient Instructions (Signed)
Medication Instructions:  No changes *If you need a refill on your cardiac medications before your next appointment, please call your pharmacy*   Lab Work: None ordered If you have labs (blood work) drawn today and your tests are completely normal, you will receive your results only by:  [facility] (if you have MyChart) OR  A paper copy in the mail If you have any lab test that is abnormal or we need to change your treatment, we will call you to review the results.   Testing/Procedures: Your physician has requested that you have an exercise tolerance test. For further information please visit [email or web address]. Please also follow instruction sheet, as given.  You will need to have the coronavirus test completed prior to your procedure. This is a Drive Up Visit at the [address] . Please tell them that you are there for pre-procedure testing. Someone will direct you to the appropriate testing line. Stay in your car and someone will be with you shortly. Please make sure to have all other labs completed before this test because you will need to stay quarantined until your procedure. Please take your insurance card to this test.     Follow-Up: At [facility], you and your health needs are our priority.  As part of our continuing mission to provide you with exceptional heart care, we have created designated Provider Care Teams.  These Care Teams include your primary Cardiologist (physician) and Advanced Practice Providers (APPs -  Physician Assistants and Nurse Practitioners) who all work together to provide you with the care you need, when you need it.  We recommend signing up for the patient portal called "MyChart".  Sign up information is provided on this After Visit Summary.  MyChart is used to connect with patients for Virtual Visits (Telemedicine).  Patients are able to view lab/test results, encounter notes, upcoming appointments, etc.  Non-urgent messages  can be sent to your provider as well.   To learn more about what you can do with MyChart, go to [email or web address].    Your next appointment:   12 month(s)  The format for your next appointment:   In Person  Provider:    Dr. Minus Breeding    Other Instructions None

## 2020-08-12 ENCOUNTER — Telehealth: Payer: Self-pay | Admitting: Cardiology

## 2020-08-12 NOTE — Telephone Encounter (Signed)
Spoke with patient regarding appointment for ETT to be scheduled at [facility] in [place], [place].  Patient states this is scheduled for Monday 08/16/20.

## 2020-09-23 ENCOUNTER — Encounter: Payer: Medicare HMO | Attending: Physical Medicine & Rehabilitation | Admitting: Physical Medicine & Rehabilitation

## 2020-09-23 ENCOUNTER — Other Ambulatory Visit: Payer: Self-pay

## 2020-09-23 ENCOUNTER — Encounter: Payer: Self-pay | Admitting: Physical Medicine & Rehabilitation

## 2020-09-23 VITALS — BP 148/79 | HR 63 | Temp 98.1°F | Ht 64.0 in | Wt 130.4 lb

## 2020-09-23 DIAGNOSIS — M5417 Radiculopathy, lumbosacral region: Secondary | ICD-10-CM | POA: Insufficient documentation

## 2020-09-23 MED ORDER — PREGABALIN 50 MG PO CAPS: 50.0000 mg | ORAL_CAPSULE | Freq: Two times a day (BID) | ORAL | 5 refills | Status: DC

## 2020-09-23 NOTE — Patient Instructions (Signed)

## 2020-09-23 NOTE — Progress Notes (Signed)
  PROCEDURE RECORD [facility]   Name: TYLICIA SHERMAN DOB:1946/10/03 MRN: [id number]  Date:09/23/2020  Physician: Alysia Penna, MD    Nurse/CMA:   Allergies:  Allergies  Allergen Reactions  . Erythromycin Swelling  . Latex Other (See Comments)    Blisters  . Morphine Sulfate Other (See Comments)    Hallucinations/deep sleep  . Clozapine Rash  . Fentanyl Nausea Only    "Feels like you are a hundred miles away when talking to her"    Consent Signed: Yes.    Is patient diabetic? No.  CBG today? N/A  Pregnant: No. LMP: No LMP recorded. Patient has had a hysterectomy. (age 33-55)  Anticoagulants: no Anti-inflammatory: no Antibiotics: no  Procedure: bilateral S1 epidural steroid injection Position: Prone Start Time:12:05pm End Time:12:15pm  Fluoro Time:24s  RN/CMA Laakea Pereira MA Wessling, CMA    Time 11:31 AM 12:20pm    BP 148/79 159/91    Pulse 63 59    Respirations 14 14    O2 Sat 94 93    S/S 6 6    Pain Level 9.5/10 9/10     D/C home with her Husband [name], D/C instructions reviewed, and sits independently.

## 2020-09-23 NOTE — Progress Notes (Signed)
Bilateral S1 transforaminal epidural steroid injection under fluoroscopic guidance with contrast enhancement  Indication: Lumbosacral radiculitis is not relieved by medication management or other conservative care and interfering with self-care and mobility.  Patient originally scheduled for caudal epidural steroid injection.  This was last performed in June 2021 however the patient states it was not effective.  Patient has fusion in the lumbar area.  Discussed with patient recommend bilateral S1 transforaminal epidural injections  Informed consent was obtained after describing risk and benefits of the procedure with the patient, this includes bleeding, bruising, infection, paralysis and medication side effects.  The patient wishes to proceed and has given written consent.  Patient was placed in prone position.  The lumbar area was marked and prepped with Betadine.  It was entered with a 25-gauge 1-1/2 inch needle and one mL of 1% lidocaine was injected into the skin and subcutaneous tissue.  Then a 22-gauge 3.5 in spinal needle was inserted into the left S1  foramen under AP, lateral, and oblique view.  Once needle tip was within the foramen on lateral views an dnor exceeding 6 o clock position on the pedical on AP viewed Isovue 200 was inected x 60ml Then a solution containing one mL of 10 mg per mL dexamethasone and 2 mL of 1% lidocaine was injected. The same procedure and technique was used to inject the R 1 sacral foramen The patient tolerated procedure well.  Post procedure instructions were given.  Please see post procedure form.  If current injections are not helpful for patient's pain, would hold off on repeating.  Follow-up with nurse practitioner in 6 months for Lyrica.  Refilled Lyrica prescription 50 mg twice daily with 5 refills

## 2020-10-07 ENCOUNTER — Other Ambulatory Visit: Payer: Self-pay | Admitting: Physical Medicine & Rehabilitation

## 2020-10-22 DIAGNOSIS — E034 Atrophy of thyroid (acquired): Secondary | ICD-10-CM | POA: Diagnosis not present

## 2020-10-22 DIAGNOSIS — R69 Illness, unspecified: Secondary | ICD-10-CM | POA: Diagnosis not present

## 2020-10-22 DIAGNOSIS — I10 Essential (primary) hypertension: Secondary | ICD-10-CM | POA: Diagnosis not present

## 2020-10-22 DIAGNOSIS — J449 Chronic obstructive pulmonary disease, unspecified: Secondary | ICD-10-CM | POA: Diagnosis not present

## 2020-10-22 DIAGNOSIS — M8949 Other hypertrophic osteoarthropathy, multiple sites: Secondary | ICD-10-CM | POA: Diagnosis not present

## 2020-11-03 ENCOUNTER — Other Ambulatory Visit: Payer: Self-pay | Admitting: Physical Medicine & Rehabilitation

## 2020-12-01 ENCOUNTER — Other Ambulatory Visit: Payer: Self-pay | Admitting: Cardiology

## 2021-01-12 DIAGNOSIS — M542 Cervicalgia: Secondary | ICD-10-CM | POA: Diagnosis not present

## 2021-01-20 DIAGNOSIS — J449 Chronic obstructive pulmonary disease, unspecified: Secondary | ICD-10-CM | POA: Diagnosis not present

## 2021-01-20 DIAGNOSIS — M8949 Other hypertrophic osteoarthropathy, multiple sites: Secondary | ICD-10-CM | POA: Diagnosis not present

## 2021-01-20 DIAGNOSIS — R69 Illness, unspecified: Secondary | ICD-10-CM | POA: Diagnosis not present

## 2021-01-20 DIAGNOSIS — E034 Atrophy of thyroid (acquired): Secondary | ICD-10-CM | POA: Diagnosis not present

## 2021-01-20 DIAGNOSIS — I10 Essential (primary) hypertension: Secondary | ICD-10-CM | POA: Diagnosis not present

## 2021-01-25 DIAGNOSIS — M5417 Radiculopathy, lumbosacral region: Secondary | ICD-10-CM | POA: Diagnosis not present

## 2021-01-25 DIAGNOSIS — M5416 Radiculopathy, lumbar region: Secondary | ICD-10-CM | POA: Diagnosis not present

## 2021-01-25 DIAGNOSIS — M542 Cervicalgia: Secondary | ICD-10-CM | POA: Diagnosis not present

## 2021-03-24 ENCOUNTER — Telehealth: Payer: Self-pay | Admitting: *Deleted

## 2021-03-24 ENCOUNTER — Encounter: Payer: Medicare HMO | Admitting: Registered Nurse

## 2021-03-24 NOTE — Telephone Encounter (Signed)
Emily Leonard called and is requesting a shot as soon as possible.  Please advise.

## 2021-04-15 ENCOUNTER — Encounter: Payer: Medicare HMO | Attending: Physical Medicine & Rehabilitation | Admitting: Physical Medicine & Rehabilitation

## 2021-04-15 ENCOUNTER — Other Ambulatory Visit: Payer: Self-pay

## 2021-04-15 ENCOUNTER — Encounter: Payer: Self-pay | Admitting: Physical Medicine & Rehabilitation

## 2021-04-15 VITALS — BP 144/86 | HR 54 | Temp 98.1°F | Ht 64.0 in | Wt 130.0 lb

## 2021-04-15 DIAGNOSIS — M5417 Radiculopathy, lumbosacral region: Secondary | ICD-10-CM | POA: Diagnosis not present

## 2021-04-15 MED ORDER — PREGABALIN 50 MG PO CAPS: 50.0000 mg | ORAL_CAPSULE | Freq: Two times a day (BID) | ORAL | 5 refills | Status: DC

## 2021-04-15 NOTE — Progress Notes (Signed)
  PROCEDURE RECORD [facility]   Name: MARTICIA REIFSCHNEIDER DOB:December 24, 1945 MRN: [id number]  Date:04/15/2021  Physician: Alysia Penna, MD    Nurse/CMA: Truman Hayward, CMA  Allergies:  Allergies  Allergen Reactions   Erythromycin Swelling   Latex Other (See Comments)    Blisters   Morphine Sulfate Other (See Comments)    Hallucinations/deep sleep   Clozapine Rash   Fentanyl Nausea Only    "Feels like you are a hundred miles away when talking to her"    Consent Signed: Yes.    Is patient diabetic? No.  CBG today?    Pregnant: No. LMP: No LMP recorded. Patient has had a hysterectomy. (age 24-55)  Anticoagulants: no Anti-inflammatory: no Antibiotics: no  Procedure: Bilateral S1 Transforaminal Epidural Steroid Injection  Position: Prone Start Time: 2:25 pm  End Time: 2:31 pm   Fluoro Time: 56  RN/CMA Truman Hayward, CMA Zeffie Bickert, CMA    Time 1:59pm 2:33pm    BP 144/86 164/91    Pulse 54 56    Respirations 16 16    O2 Sat 97 98    S/S 6 6    Pain Level 10/10 8/10     D/C home with son, [name], D/C instructions reviewed, and sits independently.

## 2021-04-15 NOTE — Progress Notes (Signed)
Bilateral S1 transforaminal epidural steroid injection under fluoroscopic guidance with contrast enhancement  Indication: Lumbosacral radiculitis is not relieved by medication management or other conservative care and interfering with self-care and mobility.  Patient originally scheduled for caudal epidural steroid injection.  This was last performed in June 2021 however the patient states it was not effective.  Patient has fusion in the lumbar area.  Discussed with patient recommend bilateral S1 transforaminal epidural injections  Informed consent was obtained after describing risk and benefits of the procedure with the patient, this includes bleeding, bruising, infection, paralysis and medication side effects.  The patient wishes to proceed and has given written consent.  Patient was placed in prone position.  The lumbar area was marked and prepped with Betadine.  It was entered with a 25-gauge 1-1/2 inch needle and one mL of 1% lidocaine was injected into the skin and subcutaneous tissue.  Then a 22-gauge 3.5 in spinal needle was inserted into the left S1  foramen under AP, lateral, and oblique view.  Once needle tip was within the foramen on lateral views an dnor exceeding 6 o clock position on the pedical on AP viewed Isovue 200 was inected x 21ml Then a solution containing one mL of 10 mg per mL dexamethasone and 2 mL of 1% lidocaine was injected. The same procedure and technique was used to inject the R 1 sacral foramen The patient tolerated procedure well.  Post procedure instructions were given.  Please see post procedure form.  Renewed patient's Lyrica 50 mg twice daily #60,5 refills See patient back in 6 months for repeat bilateral S1 transforaminal epidural steroid injection.  Based on prior history, normal duration of response is 5 to 6 months.

## 2021-04-15 NOTE — Patient Instructions (Signed)

## 2021-04-22 ENCOUNTER — Telehealth: Payer: Self-pay

## 2021-04-22 NOTE — Telephone Encounter (Signed)
Pa authorized for pregabalin .

## 2021-07-04 ENCOUNTER — Telehealth: Payer: Self-pay | Admitting: Physical Medicine & Rehabilitation

## 2021-07-04 NOTE — Telephone Encounter (Signed)
Patient is asking to get her injections earlier than scheduled. Please advise.

## 2021-07-12 ENCOUNTER — Other Ambulatory Visit: Payer: Self-pay

## 2021-07-12 ENCOUNTER — Encounter: Payer: Self-pay | Admitting: Physical Medicine & Rehabilitation

## 2021-07-12 ENCOUNTER — Encounter: Payer: Medicare HMO | Attending: Physical Medicine & Rehabilitation | Admitting: Physical Medicine & Rehabilitation

## 2021-07-12 DIAGNOSIS — M5417 Radiculopathy, lumbosacral region: Secondary | ICD-10-CM | POA: Insufficient documentation

## 2021-07-12 NOTE — Patient Instructions (Signed)

## 2021-07-12 NOTE — Progress Notes (Signed)
Bilateral S1 transforaminal epidural steroid injection under fluoroscopic guidance with contrast enhancement  Indication: Lumbosacral radiculitis is not relieved by medication management or other conservative care and interfering with self-care and mobility.  Patient originally scheduled for caudal epidural steroid injection.  This was last performed in June 2021 however the patient states it was not effective.  Patient has fusion in the lumbar area.  Discussed with patient recommend bilateral S1 transforaminal epidural injections  Informed consent was obtained after describing risk and benefits of the procedure with the patient, this includes bleeding, bruising, infection, paralysis and medication side effects.  The patient wishes to proceed and has given written consent.  Patient was placed in prone position.  The lumbar area was marked and prepped with Betadine.  It was entered with a 25-gauge 1-1/2 inch needle and one mL of 1% lidocaine was injected into the skin and subcutaneous tissue.  Then a 22-gauge 3.5 in spinal needle was inserted into the left S1  foramen under AP, lateral, and oblique view.  Once needle tip was within the foramen on lateral views an dnor exceeding 6 o clock position on the pedical on AP viewed Isovue 200 was inected x 74ml Then a solution containing one mL of 10 mg per mL dexamethasone and 2 mL of 1% lidocaine was injected. The same procedure and technique was used to inject the R 1 sacral foramen The patient tolerated procedure well.  Post procedure instructions were given.  Please see post procedure form.  Renewed patient's Lyrica 50 mg twice daily #60,5 refills See patient back in 3 months for repeat bilateral S1 transforaminal epidural steroid injection.  PT STATES THAT LAST PROCEDURE LASTED 3 MONTHS Pt to call back if groin pain does not improve with this injection , would need appt in that case

## 2021-07-12 NOTE — Progress Notes (Signed)
   PROCEDURE RECORD [facility]   Name: Emily Leonard DOB:1946/04/18 MRN: [id number]  Date:07/12/2021  Physician: Alysia Penna, MD    Nurse/CMA: Jorja Loa MA  Allergies:  Allergies  Allergen Reactions   Erythromycin Swelling   Latex Other (See Comments)    Blisters   Morphine Sulfate Other (See Comments)    Hallucinations/deep sleep   Clozapine Rash   Fentanyl Nausea Only    "Feels like you are a hundred miles away when talking to her"    Consent Signed: Yes.    Is patient diabetic? No.  CBG today? N/A  Pregnant: No. LMP: No LMP recorded. Patient has had a hysterectomy. (age 52-55)  Anticoagulants: no Anti-inflammatory: no Antibiotics: no  Procedure: Bilateral S1 Epidural Steroid Inj  Position: Prone Start Time: 1:18 pm  End Time: 1:25 pm  Fluoro Time: 51  RN/CMA Truman Hayward, CMA Parker MA    Time 12:32 pm 1:29 pm    BP 138/88 134/77    Pulse 69 63    Respirations 16 16    O2 Sat 95 95    S/S 6 6    Pain Level 10/10 4/10     D/C home with husband, [name], D/C instructions reviewed, and sits independently.

## 2021-07-31 DIAGNOSIS — R931 Abnormal findings on diagnostic imaging of heart and coronary circulation: Secondary | ICD-10-CM | POA: Insufficient documentation

## 2021-07-31 DIAGNOSIS — I6529 Occlusion and stenosis of unspecified carotid artery: Secondary | ICD-10-CM | POA: Insufficient documentation

## 2021-07-31 NOTE — Progress Notes (Signed)
Cardiology Office Note   Date:  08/01/2021   ID:  [name], DOB [birth year], MRN [id number]  PCP:  Emily Every, MD  Cardiologist:   None   Chief Complaint  Patient presents with   Palpitations       History of Present Illness: Emily Leonard is a 75 y.o. female who presents for follow up of chest discomfort and palpitations. She's had a stress test in 2015 which was unremarkable.  Since I last saw her she has done relatively well.  She is very limited by back pain but she still does a lot of household chores. The patient denies any new symptoms such as chest discomfort, neck or arm discomfort. There has been no new shortness of breath, PND or orthopnea. There have been no reported palpitations, presyncope or syncope.     Past Medical History:  Diagnosis Date   Anxiety    Chronic pain syndrome    Dr Jamse Belfast    COPD (chronic obstructive pulmonary disease) ([place])    Degenerative disc disease    Degenerative joint disease    Depression    Essential hypertension, benign    GERD (gastroesophageal reflux disease)    Hyperlipidemia    Hypothyroidism    Insomnia    Osteoarthritis    Palpitations    Restless leg syndrome    TIA (transient ischemic attack) unsure    Past Surgical History:  Procedure Laterality Date   ABDOMINAL HYSTERECTOMY     ANTERIOR CERVICAL DECOMP/DISCECTOMY FUSION N/A 05/07/2017   Procedure: ANTERIOR CERVICAL DECOMPRESSION/DISCECTOMY FUSION CERVICAL FOUR- CERVICAL FIVE, CERVICAL FIVE- CERVICAL SIX, CERVICAL SIX- CERVICAL SEVEN;  Surgeon: Newman Pies, MD;  Location: [place];  Service: Neurosurgery;  Laterality: N/A;   ANTERIOR CERVICAL DECOMP/DISCECTOMY FUSION N/A 06/21/2017   Procedure: Revision Anterior Instrumentation Cervical four-seven;  Surgeon: Newman Pies, MD;  Location: [place];  Service: Neurosurgery;  Laterality: N/A;   APPENDECTOMY     CATARACT EXTRACTION, BILATERAL     CERVICAL SPINE SURGERY     COLONOSCOPY  02/2013    EXCISION/RELEASE BURSA HIP Right 09/17/2019   Procedure: Right hip bursectomy;  Surgeon: Gaynelle Arabian, MD;  Location: [facility];  Service: Orthopedics;  Laterality: Right;   HEMORRHOID SURGERY  03/2013   LUMBAR SPINE SURGERY     x 3   POSTERIOR CERVICAL FUSION/FORAMINOTOMY N/A 06/21/2017   Procedure: POSTERIOR CERVICAL FUSION WITH LATERAL MASS FIXATION CERVICAL FOUR- CERVICAL FIVE, CERVICAL FIVE- CERVICAL SIX, CERVICAL SIX- CERVICAL SEVEN;  Surgeon: Newman Pies, MD;  Location: [facility];  Service: Neurosurgery;  Laterality: N/A;     Current Outpatient Medications  Medication Sig Dispense Refill   albuterol (VENTOLIN HFA) 108 (90 Base) MCG/ACT inhaler Inhale 2 puffs into the lungs Leonard 6 (six) hours as needed for wheezing or shortness of breath.     alendronate (FOSAMAX) 70 MG tablet Take 70 mg by mouth once a week. Saturday     aspirin EC 81 MG tablet Take 81 mg by mouth daily.     budesonide-formoterol (SYMBICORT) 160-4.5 MCG/ACT inhaler Inhale into the lungs.     Calcium Carbonate-Vitamin D (CALCIUM-VITAMIN D3) 600-125 MG-UNIT TABS Take 600 mg by mouth daily.     diltiazem (CARDIZEM) 30 MG tablet TAKE 1 TABLET BY MOUTH AT BEDTIME (Patient taking differently: Take 30 mg by mouth daily.) 90 tablet 1   DULoxetine (CYMBALTA) 30 MG capsule Take 1 capsule (30 mg total) by mouth daily. (Patient taking differently: Take 30 mg by mouth 2 (  two) times daily.) 30 capsule 5   GAVILYTE-N WITH FLAVOR PACK 420 G solution Take 4,000 mLs by mouth once.      hydrochlorothiazide 25 MG tablet Take 25 mg by mouth daily.      HYDROcodone-acetaminophen (NORCO/VICODIN) 5-325 MG tablet Take 1 tablet by mouth Leonard 6 (six) hours as needed for moderate pain.     HYDROcodone-acetaminophen (NORCO/VICODIN) 5-325 MG tablet hydrocodone 5 mg-acetaminophen 325 mg tablet  Take 1 tablet 3 times a day by oral route as needed.     levothyroxine (SYNTHROID, LEVOTHROID) 100 MCG tablet Take 100 mcg by mouth daily before breakfast.   1    lidocaine (LIDODERM) 5 % Place 2 patches onto the skin daily. Remove & Discard patch within 12 hours or as directed by MD (Patient taking differently: Place 2 patches onto the skin daily as needed (Lower back and shoulder). Remove & Discard patch within 12 hours or as directed by MD) 60 patch 2   magnesium oxide (MAG-OX) 400 (241.3 Mg) MG tablet Take 1 tablet by mouth daily.     meloxicam (MOBIC) 7.5 MG tablet Take 7.5 mg by mouth daily.     pravastatin (PRAVACHOL) 40 MG tablet Take 1 tablet by mouth daily.     pravastatin (PRAVACHOL) 80 MG tablet TAKE 1 TABLET BY MOUTH AT BEDTIME 90 tablet 2   pregabalin (LYRICA) 50 MG capsule Take 1 capsule (50 mg total) by mouth 2 (two) times daily. 60 capsule 5   traZODone (DESYREL) 150 MG tablet Take 150 mg by mouth at bedtime.   0   ammonium lactate (LAC-HYDRIN) 12 % lotion ammonium lactate 12 % lotion (Patient not taking: Reported on 08/01/2021)     budesonide-formoterol (SYMBICORT) 160-4.5 MCG/ACT inhaler Inhale into the lungs. (Patient not taking: Reported on 08/01/2021)     levothyroxine (SYNTHROID) 88 MCG tablet Take 1 tablet by mouth daily. (Patient not taking: Reported on 08/01/2021)     traMADol (ULTRAM) 50 MG tablet Take by mouth. (Patient not taking: Reported on 08/01/2021)     No current facility-administered medications for this visit.    Allergies:   Erythromycin, Latex, Morphine sulfate, Clozapine, and Fentanyl   ROS:  Please see the history of present illness.   Otherwise, review of systems are positive for none.   All other systems are reviewed and negative.    PHYSICAL EXAM: VS:  BP 122/82   Pulse (!) 56   Ht 5\' 4"  (1.626 m)   Wt 119 lb 6.4 oz (54.2 kg)   SpO2 93%   BMI 20.49 kg/m  , BMI Body mass index is 20.49 kg/m. GENERAL:  Well appearing NECK:  No jugular venous distention, waveform within normal limits, carotid upstroke brisk and symmetric, no bruits, no thyromegaly LUNGS:  Clear to auscultation bilaterally CHEST:   Unremarkable HEART:  PMI not displaced or sustained,S1 and S2 within normal limits, no S3, no S4, no clicks, no rubs, no murmurs ABD:  Flat, positive bowel sounds normal in frequency in pitch, no bruits, no rebound, no guarding, no midline pulsatile mass, no hepatomegaly, no splenomegaly EXT:  2 plus pulses throughout, no edema, no cyanosis no clubbing   EKG:  EKG is  ordered today. The ekg ordered today demonstrates sinus rhythm, rate 56, axis within normal limits, intervals within normal limits, no acute ST-T wave changes.   Recent Labs: No results found for requested labs within last 8760 hours.    Lipid Panel    Component Value Date/Time   CHOL  03/23/2009 0630    160        ATP III CLASSIFICATION:  <200     mg/dL   Desirable  200-239  mg/dL   Borderline High  >=240    mg/dL   High          TRIG 100 03/23/2009 0630   HDL 44 03/23/2009 0630   CHOLHDL 3.6 03/23/2009 0630   VLDL 20 03/23/2009 0630   LDLCALC  03/23/2009 0630    96        Total Cholesterol/HDL:CHD Risk Coronary Heart Disease Risk Table                     Men   Women  1/2 Average Risk   3.4   3.3  Average Risk       5.0   4.4  2 X Average Risk   9.6   7.1  3 X Average Risk  23.4   11.0        Use the calculated Patient Ratio above and the CHD Risk Table to determine the patient's CHD Risk.        ATP III CLASSIFICATION (LDL):  <100     mg/dL   Optimal  100-129  mg/dL   Near or Above                    Optimal  130-159  mg/dL   Borderline  160-189  mg/dL   High  >190     mg/dL   Very High      Wt Readings from Last 3 Encounters:  08/01/21 119 lb 6.4 oz (54.2 kg)  04/15/21 130 lb (59 kg)  09/23/20 130 lb 6.4 oz (59.1 kg)      Other studies Reviewed: Additional studies/ records that were reviewed today include: Labs. Review of the above records demonstrates:    Please see elsewhere in the note.     ASSESSMENT AND PLAN:  PALPITATIONS:     She is not particularly bothered by these.  No  change in therapy.   CAROTID PLAQUE:   This was mild previously.  No further evaluation.  CORONARY CALCIUM: She has had no symptoms.  She would be able to walk on a treadmill.  She does have some elevated coronary calcium but I think this can be managed conservatively with risk reduction.   DYSLIPIDEMIA:  LDL was 86 with an HDL of 89.  No change in therapy.     TOBACCO ABUSE:    She says if she gave up smoking she have to start drinking.   Of course by that she means she will be able to quit smoking.   Current medicines are reviewed at length with the patient today.  The patient does not have concerns regarding medicines.  The following changes have been made:  no change  Labs/ tests ordered today include:   Orders Placed This Encounter  Procedures   EKG 12-Lead      Disposition:   FU with me in 1 year or sooner if needed.   Signed, Minus Breeding, MD  08/01/2021 1:23 PM    [facility]

## 2021-08-01 ENCOUNTER — Other Ambulatory Visit: Payer: Self-pay

## 2021-08-01 ENCOUNTER — Ambulatory Visit: Payer: Medicare HMO | Admitting: Cardiology

## 2021-08-01 ENCOUNTER — Encounter: Payer: Self-pay | Admitting: Cardiology

## 2021-08-01 VITALS — BP 122/82 | HR 56 | Ht 64.0 in | Wt 119.4 lb

## 2021-08-01 DIAGNOSIS — E785 Hyperlipidemia, unspecified: Secondary | ICD-10-CM | POA: Diagnosis not present

## 2021-08-01 DIAGNOSIS — I6529 Occlusion and stenosis of unspecified carotid artery: Secondary | ICD-10-CM | POA: Diagnosis not present

## 2021-08-01 DIAGNOSIS — R002 Palpitations: Secondary | ICD-10-CM

## 2021-08-01 DIAGNOSIS — Z720 Tobacco use: Secondary | ICD-10-CM

## 2021-08-01 DIAGNOSIS — R931 Abnormal findings on diagnostic imaging of heart and coronary circulation: Secondary | ICD-10-CM

## 2021-08-01 NOTE — Patient Instructions (Signed)

## 2021-08-08 ENCOUNTER — Ambulatory Visit: Payer: Medicare HMO | Admitting: Cardiology

## 2021-08-19 ENCOUNTER — Ambulatory Visit: Payer: Medicare HMO | Admitting: Physical Medicine & Rehabilitation

## 2021-08-30 ENCOUNTER — Other Ambulatory Visit: Payer: Self-pay | Admitting: Cardiology

## 2021-10-18 ENCOUNTER — Ambulatory Visit: Payer: Medicare HMO | Admitting: Physical Medicine & Rehabilitation

## 2021-11-02 ENCOUNTER — Other Ambulatory Visit: Payer: Self-pay | Admitting: Physical Medicine & Rehabilitation

## 2021-11-28 ENCOUNTER — Other Ambulatory Visit: Payer: Self-pay | Admitting: Cardiology

## 2021-11-29 ENCOUNTER — Encounter: Payer: Self-pay | Admitting: Physical Medicine & Rehabilitation

## 2021-11-29 ENCOUNTER — Encounter: Payer: Medicare HMO | Attending: Physical Medicine & Rehabilitation | Admitting: Physical Medicine & Rehabilitation

## 2021-11-29 ENCOUNTER — Other Ambulatory Visit: Payer: Self-pay

## 2021-11-29 VITALS — BP 118/74 | HR 60 | Temp 98.2°F | Ht 64.0 in | Wt 116.4 lb

## 2021-11-29 DIAGNOSIS — M5417 Radiculopathy, lumbosacral region: Secondary | ICD-10-CM | POA: Diagnosis not present

## 2021-11-29 NOTE — Progress Notes (Signed)
Subjective:    Patient ID: Emily Leonard, female    DOB: Sep 15, 1946, 76 y.o.   MRN: [id number]  HPI  76 year old female with history of L2-L5 fusion who returns today with primary complaints of back pain and lower extremity pain. Pain in low back radiating into both groin and into Left foot  Using anti inflammatory meds without relief, she has tried Gavilan 50 mg twice daily without significant improvements, dose escalation is limited by sedation/confusion   Went to PT at [facility]  Date PT Treatment Started-09/19/2021  Plan of Care Effective Date: 09/19/2021 - 11/04/2021  "Ms. Mckendree has made limited progress in PT since her initial evaluation on 09/19/21." Per Pecola Leisure PT note  The patient is not a good candidate for opioids given that she had mental status changes as well as difficulty following the prescribed amounts  Pain Inventory Average Pain 10 Pain Right Now 10 My pain is constant  In the last 24 hours, has pain interfered with the following? General activity 10 Relation with others 10 Enjoyment of life 10 What TIME of day is your pain at its worst? morning , daytime, evening, and night Sleep (in general) NA  Pain is worse with: walking, bending, sitting, inactivity, standing, and some activites Pain improves with: injections Relief from Meds:  na  Family History  Problem Relation Age of Onset   Hypertension Mother    Hypertension Father    Heart attack Father 87   Heart attack Brother 30   Social History   Socioeconomic History   Marital status: Married    Spouse name: Not on file   Number of children: 4   Years of education: Not on file   Highest education level: Not on file  Occupational History   Not on file  Tobacco Use   Smoking status: Every Day    Packs/day: 0.75    Years: 40.00    Pack years: 30.00    Types: Cigarettes   Smokeless tobacco: Never  Vaping Use   Vaping Use: Never used  Substance and Sexual Activity   Alcohol use:  Yes    Alcohol/week: 1.0 standard drink    Types: 1 Standard drinks or equivalent per week    Comment: rarely   Drug use: No   Sexual activity: Not on file  Other Topics Concern   Not on file  Social History Narrative   Not on file   Social Determinants of Health   Financial Resource Strain: Not on file  Food Insecurity: Not on file  Transportation Needs: Not on file  Physical Activity: Not on file  Stress: Not on file  Social Connections: Not on file   Past Surgical History:  Procedure Laterality Date   ABDOMINAL HYSTERECTOMY     ANTERIOR CERVICAL DECOMP/DISCECTOMY FUSION N/A 05/07/2017   Procedure: ANTERIOR CERVICAL DECOMPRESSION/DISCECTOMY FUSION CERVICAL FOUR- CERVICAL FIVE, CERVICAL FIVE- CERVICAL SIX, CERVICAL SIX- CERVICAL SEVEN;  Surgeon: Newman Pies, MD;  Location: [place];  Service: Neurosurgery;  Laterality: N/A;   ANTERIOR CERVICAL DECOMP/DISCECTOMY FUSION N/A 06/21/2017   Procedure: Revision Anterior Instrumentation Cervical four-seven;  Surgeon: Newman Pies, MD;  Location: [place];  Service: Neurosurgery;  Laterality: N/A;   APPENDECTOMY     CATARACT EXTRACTION, BILATERAL     CERVICAL SPINE SURGERY     COLONOSCOPY  02/2013   EXCISION/RELEASE BURSA HIP Right 09/17/2019   Procedure: Right hip bursectomy;  Surgeon: Gaynelle Arabian, MD;  Location: [facility];  Service: Orthopedics;  Laterality: Right;  HEMORRHOID SURGERY  03/2013   LUMBAR SPINE SURGERY     x 3   POSTERIOR CERVICAL FUSION/FORAMINOTOMY N/A 06/21/2017   Procedure: POSTERIOR CERVICAL FUSION WITH LATERAL MASS FIXATION CERVICAL FOUR- CERVICAL FIVE, CERVICAL FIVE- CERVICAL SIX, CERVICAL SIX- CERVICAL SEVEN;  Surgeon: Newman Pies, MD;  Location: [place];  Service: Neurosurgery;  Laterality: N/A;   Past Surgical History:  Procedure Laterality Date   ABDOMINAL HYSTERECTOMY     ANTERIOR CERVICAL DECOMP/DISCECTOMY FUSION N/A 05/07/2017   Procedure: ANTERIOR CERVICAL DECOMPRESSION/DISCECTOMY FUSION CERVICAL FOUR-  CERVICAL FIVE, CERVICAL FIVE- CERVICAL SIX, CERVICAL SIX- CERVICAL SEVEN;  Surgeon: Newman Pies, MD;  Location: [place];  Service: Neurosurgery;  Laterality: N/A;   ANTERIOR CERVICAL DECOMP/DISCECTOMY FUSION N/A 06/21/2017   Procedure: Revision Anterior Instrumentation Cervical four-seven;  Surgeon: Newman Pies, MD;  Location: [place];  Service: Neurosurgery;  Laterality: N/A;   APPENDECTOMY     CATARACT EXTRACTION, BILATERAL     CERVICAL SPINE SURGERY     COLONOSCOPY  02/2013   EXCISION/RELEASE BURSA HIP Right 09/17/2019   Procedure: Right hip bursectomy;  Surgeon: Gaynelle Arabian, MD;  [facility];  Service: Orthopedics;  Laterality: Right;   HEMORRHOID SURGERY  03/2013   LUMBAR SPINE SURGERY     x 3   POSTERIOR CERVICAL FUSION/FORAMINOTOMY N/A 06/21/2017   Procedure: POSTERIOR CERVICAL FUSION WITH LATERAL MASS FIXATION CERVICAL FOUR- CERVICAL FIVE, CERVICAL FIVE- CERVICAL SIX, CERVICAL SIX- CERVICAL SEVEN;  Surgeon: Newman Pies, MD;  Location: [place];  Service: Neurosurgery;  Laterality: N/A;   Past Medical History:  Diagnosis Date   Anxiety    Chronic pain syndrome    Dr Jamse Belfast    COPD (chronic obstructive pulmonary disease) ([place])    Degenerative disc disease    Degenerative joint disease    Depression    Essential hypertension, benign    GERD (gastroesophageal reflux disease)    Hyperlipidemia    Hypothyroidism    Insomnia    Osteoarthritis    Palpitations    Restless leg syndrome    TIA (transient ischemic attack) unsure   BP 118/74    Pulse 60    Temp 98.2 F (36.8 C)    Ht 5\' 4"  (1.626 m)    Wt 116 lb 6.4 oz (52.8 kg)    SpO2 95%    BMI 19.98 kg/m   Opioid Risk Score:   Fall Risk Score:  `1  Depression screen PHQ 2/9  Depression screen [facility] 2/9 11/29/2021 09/23/2020 06/10/2018 11/16/2016 12/31/2014  Decreased Interest 0 0 0 0 3  Down, Depressed, Hopeless 0 0 0 0 0  PHQ - 2 Score 0 0 0 0 3  Altered sleeping - - - - 0  Tired, decreased energy - - - - 1   Change in appetite - - - - 0  Feeling bad or failure about yourself  - - - - 0  Trouble concentrating - - - - 0  Moving slowly or fidgety/restless - - - - 0  Suicidal thoughts - - - - 0  PHQ-9 Score - - - - 4  Some recent data might be hidden    Review of Systems  Constitutional: Negative.   HENT: Negative.    Eyes: Negative.   Respiratory: Negative.    Cardiovascular: Negative.   Gastrointestinal: Negative.   Endocrine: Negative.   Genitourinary: Negative.   Musculoskeletal:  Positive for back pain.  Skin: Negative.   Allergic/Immunologic: Negative.   Neurological: Negative.   Hematological: Negative.  Psychiatric/Behavioral: Negative.    All other systems reviewed and are negative.     Objective:   Physical Exam  Thin elderly female no acute distress Cervical spine does have posterior incision line with paraspinal muscle atrophy Mood and affect are appropriate Patient is hard of hearing Motor strength is 5/5 bilateral deltoid bicep tricep grip hip flexor knee extensor ankle dorsiflexor Negative straight leg raising bilaterally Sensation intact light touch bilateral L2-3-4 5 S1 dermatomal distribution  deep tendon reflexes 1+ bilateral and ankles Ambulates without assistive device no evidence of toe drag or knee instability    Assessment & Plan:   1.  Lumbar postlaminectomy syndrome the patient has radicular discomfort sciatic pain radiating into hips as well is left lower extremity.  She has tried physical therapy and just finished last month without relief.  She has been on pregabalin and has been limited by side effects in terms of dosage escalation. Patient has had problems with opioid analgesics mental status changes. Has had good relief of around 3 months duration with bilateral S1 transforaminal injections epidural steroid injections we will schedule for repeat

## 2021-12-08 ENCOUNTER — Other Ambulatory Visit: Payer: Self-pay | Admitting: Physical Medicine & Rehabilitation

## 2021-12-16 ENCOUNTER — Encounter: Payer: Medicare HMO | Attending: Physical Medicine & Rehabilitation | Admitting: Physical Medicine & Rehabilitation

## 2021-12-16 ENCOUNTER — Encounter: Payer: Self-pay | Admitting: Physical Medicine & Rehabilitation

## 2021-12-16 ENCOUNTER — Other Ambulatory Visit: Payer: Self-pay

## 2021-12-16 VITALS — BP 144/81 | HR 56 | Temp 98.4°F | Ht 64.0 in | Wt 118.4 lb

## 2021-12-16 DIAGNOSIS — M5417 Radiculopathy, lumbosacral region: Secondary | ICD-10-CM | POA: Insufficient documentation

## 2021-12-16 MED ORDER — PREGABALIN 50 MG PO CAPS: 50.0000 mg | ORAL_CAPSULE | Freq: Two times a day (BID) | ORAL | 5 refills | Status: DC

## 2021-12-16 NOTE — Progress Notes (Signed)
Bilateral S1 transforaminal epidural steroid injection under fluoroscopic guidance with contrast enhancement ? ?Indication: Lumbosacral radiculitis is not relieved by medication management or other conservative care and interfering with self-care and mobility.  ?Patient originally scheduled for caudal epidural steroid injection.  This was last performed in June 2021 however the patient states it was not effective.  Patient has fusion in the lumbar area.  Discussed with patient recommend bilateral S1 transforaminal epidural injections ? ?Informed consent was obtained after describing risk and benefits of the procedure with the patient, this includes bleeding, bruising, infection, paralysis and medication side effects.  The patient wishes to proceed and has given written consent.  Patient was placed in prone position.  The lumbar area was marked and prepped with Betadine.  It was entered with a 25-gauge 1-1/2 inch needle and one mL of 1% lidocaine was injected into the skin and subcutaneous tissue.  Then a 22-gauge 3.5 in spinal needle was inserted into the left S1  foramen under AP, lateral, and oblique view.  Once needle tip was within the foramen on lateral views an dnor exceeding 6 o clock position on the pedical on AP viewed Isovue 200 was inected x 40ml Then a solution containing one mL of 10 mg per mL dexamethasone and 2 mL of 1% lidocaine was injected. The same procedure and technique was used to inject the R 1 sacral foramen The patient tolerated procedure well.  Post procedure instructions were given.  Please see post procedure form. ? ?Renewed patient's Lyrica 50 mg twice daily #60,5 refills ?See patient back in 3 months for repeat bilateral S1 transforaminal epidural steroid injection.  PT STATES THAT LAST PROCEDURE LASTED 3 MONTHS ?Pt to call back if groin pain does not improve with this injection , would need appt in that case  ? ? ?

## 2021-12-16 NOTE — Patient Instructions (Signed)

## 2021-12-16 NOTE — Progress Notes (Signed)
?  PROCEDURE RECORD ?[facility] ? ? ?Name: Emily Leonard ?DOB:July 08, 1946 ?MRN: [id number] ? ?Date:12/16/2021  Physician: Alysia Penna, MD   ? ?Nurse/CMA: Ronnald Ramp, RMA  ? ?Allergies:  ?Allergies  ?Allergen Reactions  ? Erythromycin Swelling  ? Latex Other (See Comments)  ?  Blisters  ? Morphine Sulfate Other (See Comments)  ?  Hallucinations/deep sleep  ? Clozapine Rash  ? Fentanyl Nausea Only  ?  "Feels like you are a hundred miles away when talking to her"  ? ? ?Consent Signed: Yes.    Is patient diabetic? No.  CBG today?  ? ?Pregnant: No. LMP: No LMP recorded. Patient has had a hysterectomy. (age 59-55) ? ?Anticoagulants:  81 mg asa ?Anti-inflammatory: no ?Antibiotics: no ? ?Procedure: Bilateral S1 transforaminal Epidural Steroid Injection  Position: Prone ?Start Time: 12:15 pm  End Time: 12:25 pm  Fluoro Time: 52 ? ?[place] Ronnald Ramp, RMA    ?Time 11:20 12:30    ?BP 144/81 145/81    ?Pulse 56 65    ?Respirations 14 16    ?O2 Sat 94 95    ?S/S 6 6    ?Pain Level 10 6    ? ?D/C home with husband, [name], D/C instructions reviewed, and sits independently. ? ? ? ? ? ? ? ?

## 2022-01-16 ENCOUNTER — Other Ambulatory Visit: Payer: Self-pay | Admitting: Neurosurgery

## 2022-01-16 DIAGNOSIS — M545 Low back pain, unspecified: Secondary | ICD-10-CM

## 2022-01-16 DIAGNOSIS — M5412 Radiculopathy, cervical region: Secondary | ICD-10-CM

## 2022-01-24 ENCOUNTER — Ambulatory Visit
Admission: RE | Admit: 2022-01-24 | Discharge: 2022-01-24 | Disposition: A | Discharge status: Home / Self Care | Payer: Medicare HMO | Source: Ambulatory Visit | Attending: Neurosurgery | Admitting: Neurosurgery

## 2022-01-24 DIAGNOSIS — M5412 Radiculopathy, cervical region: Secondary | ICD-10-CM

## 2022-01-24 DIAGNOSIS — G8929 Other chronic pain: Secondary | ICD-10-CM

## 2022-01-24 MED ORDER — MEPERIDINE HCL 50 MG/ML IJ SOLN: 50.0000 mg | Freq: Once | INTRAMUSCULAR | Status: DC | PRN

## 2022-01-24 MED ORDER — ONDANSETRON HCL 4 MG/2ML IJ SOLN: 4.0000 mg | Freq: Once | INTRAMUSCULAR | Status: DC | PRN

## 2022-01-24 MED ORDER — IOPAMIDOL (ISOVUE-M 300) INJECTION 61%
10.0000 mL | Freq: Once | INTRAMUSCULAR | Status: AC
  Administered 2022-01-24: 10 mL via INTRATHECAL

## 2022-01-24 MED ORDER — DIAZEPAM 5 MG PO TABS
5.0000 mg | ORAL_TABLET | Freq: Once | ORAL | Status: AC
  Administered 2022-01-24: 5 mg via ORAL

## 2022-01-24 NOTE — Discharge Instructions (Signed)

## 2022-02-06 ENCOUNTER — Telehealth: Payer: Self-pay | Admitting: *Deleted

## 2022-02-06 DIAGNOSIS — M5417 Radiculopathy, lumbosacral region: Secondary | ICD-10-CM

## 2022-02-06 DIAGNOSIS — M5136 Other intervertebral disc degeneration, lumbar region: Secondary | ICD-10-CM

## 2022-02-06 DIAGNOSIS — M961 Postlaminectomy syndrome, not elsewhere classified: Secondary | ICD-10-CM

## 2022-02-06 NOTE — Telephone Encounter (Signed)
Emily Leonard would like her records transferred to [name]. (May be requesting a referral?) ?

## 2022-02-07 NOTE — Telephone Encounter (Signed)
Referral placed.

## 2022-02-28 ENCOUNTER — Other Ambulatory Visit: Payer: Self-pay | Admitting: Cardiology

## 2022-06-20 ENCOUNTER — Encounter: Payer: Self-pay | Admitting: Physical Medicine & Rehabilitation

## 2022-06-20 ENCOUNTER — Encounter: Payer: Medicare HMO | Attending: Physical Medicine & Rehabilitation | Admitting: Physical Medicine & Rehabilitation

## 2022-06-20 ENCOUNTER — Other Ambulatory Visit: Payer: Self-pay

## 2022-06-20 VITALS — BP 154/79 | HR 52 | Ht 64.0 in | Wt 108.0 lb

## 2022-06-20 DIAGNOSIS — M5417 Radiculopathy, lumbosacral region: Secondary | ICD-10-CM | POA: Diagnosis present

## 2022-06-20 MED ORDER — PREGABALIN 50 MG PO CAPS: 50.0000 mg | ORAL_CAPSULE | Freq: Two times a day (BID) | ORAL | 5 refills | Status: DC

## 2022-06-20 MED ORDER — DEXAMETHASONE SODIUM PHOSPHATE 10 MG/ML IJ SOLN
10.0000 mg | Freq: Once | INTRAMUSCULAR | Status: AC
  Administered 2022-06-20: 10 mg

## 2022-06-20 MED ORDER — LIDOCAINE HCL 1 % IJ SOLN
5.0000 mL | Freq: Once | INTRAMUSCULAR | Status: AC
  Administered 2022-06-20: 5 mL

## 2022-06-20 MED ORDER — LIDOCAINE HCL (PF) 1 % IJ SOLN
2.0000 mL | Freq: Once | INTRAMUSCULAR | Status: AC
  Administered 2022-06-20: 2 mL

## 2022-06-20 NOTE — Progress Notes (Signed)
Bilateral S1 transforaminal epidural steroid injection under fluoroscopic guidance with contrast enhancement  Indication: Lumbosacral radiculitis is not relieved by medication management or other conservative care and interfering with self-care and mobility.  Patient originally scheduled for caudal epidural steroid injection.  This was last performed in June 2021 however the patient states it was not effective.  Patient has fusion in the lumbar area.  Discussed with patient recommend bilateral S1 transforaminal epidural injections  Informed consent was obtained after describing risk and benefits of the procedure with the patient, this includes bleeding, bruising, infection, paralysis and medication side effects.  The patient wishes to proceed and has given written consent.  Patient was placed in prone position.  The lumbar area was marked and prepped with Betadine.  It was entered with a 25-gauge 1-1/2 inch needle and one mL of 1% lidocaine was injected into the skin and subcutaneous tissue.  Then a 22-gauge 3.5 in spinal needle was inserted into the left S1  foramen under AP, lateral, and oblique view.  Once needle tip was within the foramen on lateral views an dnor exceeding 6 o clock position on the pedical on AP viewed Isovue 200 was inected x 35m Then a solution containing one mL of 10 mg per mL dexamethasone and 2 mL of 1% lidocaine was injected. The same procedure and technique was used to inject the R 1 sacral foramen The patient tolerated procedure well.  Post procedure instructions were given.  Please see post procedure form.  Renewed patient's Lyrica 50 mg twice daily #60,5 refills Pt now seeing Dr RNelva Bushfor low back , pt was not aware that we are both PMR, will see pt prn

## 2022-06-20 NOTE — Progress Notes (Signed)
  PROCEDURE RECORD [facility]   Name: Emily Leonard DOB:02/19/1946 MRN: [id number]  Date:06/20/2022  Physician: Alysia Penna, MD    Nurse/CMA: Jorja Loa MA  Allergies:  Allergies  Allergen Reactions   Erythromycin Swelling    Other reaction(s): Not available   Latex Other (See Comments)    Blisters Other reaction(s): Not available   Morphine     Other reaction(s): Not available   Morphine Sulfate Other (See Comments)    Hallucinations/deep sleep   Clozapine Rash   Fentanyl Nausea Only    "Feels like you are a hundred miles away when talking to her"    Consent Signed: Yes.    Is patient diabetic? No.  CBG today? N/A  Pregnant: No. LMP: No LMP recorded. Patient has had a hysterectomy. (age 3-55)  Anticoagulants: yes (No) Anti-inflammatory: no Antibiotics: no  Procedure:Bilateral Epidural Steroid Injection  Position: Prone Start Time: 11:54 am  End Time: 12:05 am  Fluoro Time: 70  RN/CMA Gianni Mihalik MA Amond Speranza MA    Time 11:41 am 12:06 pm    BP 154/79 146/80    Pulse 51 58    Respirations 16 16    O2 Sat 98 96    S/S 6 6    Pain Level 9/10 5/10     D/C home with Spouse, [name], D/C instructions reviewed, and sits independently.

## 2022-07-27 ENCOUNTER — Ambulatory Visit: Payer: Medicare HMO | Admitting: Cardiology

## 2022-08-08 ENCOUNTER — Encounter: Payer: Self-pay | Admitting: Physical Medicine & Rehabilitation

## 2022-08-08 ENCOUNTER — Encounter: Payer: Medicare HMO | Attending: Physical Medicine & Rehabilitation | Admitting: Physical Medicine & Rehabilitation

## 2022-08-08 VITALS — BP 139/82 | HR 58 | Temp 97.6°F | Ht 64.0 in | Wt 109.0 lb

## 2022-08-08 DIAGNOSIS — M961 Postlaminectomy syndrome, not elsewhere classified: Secondary | ICD-10-CM | POA: Insufficient documentation

## 2022-08-08 DIAGNOSIS — M5417 Radiculopathy, lumbosacral region: Secondary | ICD-10-CM | POA: Diagnosis not present

## 2022-08-08 MED ORDER — PREGABALIN 50 MG PO CAPS: 50.0000 mg | ORAL_CAPSULE | Freq: Three times a day (TID) | ORAL | 1 refills | Status: DC

## 2022-08-08 NOTE — Patient Instructions (Signed)
Increase Pregabalin to '50mg'$  3 times a day

## 2022-08-08 NOTE — Progress Notes (Signed)
Subjective:    Patient ID: Emily Leonard, female    DOB: 10-15-1946, 76 y.o.   MRN: [id number]  HPI 76 year old female with chronic low back pain she is status post L2-L5 fusion.  Her pain is at the lumbosacral junction area.  She has intermittent pain down the left lower extremity.  She underwent left S1 ESI as well as right S1 ESI proximately 6 weeks ago and this injection helped for about 4 weeks.  She continues to take pregabalin 50 mg twice daily She has been on narcotic analgesics in the past but has had episodes of oversedation on fentanyl patch as well as patient borrowing a fentanyl patch from a friend and ending up in the emergency room with adverse event. She remains functionally independent with all self-care and mobility She has undergone neurosurgical reevaluation in April of this year, repeat lumbar myelogram did not show any significant compressive lesions in the area of the previous fusion no loosening of hardware.  Pain Inventory Average Pain 9 Pain Right Now 10 My pain is constant, sharp, stabbing, and aching  In the last 24 hours, has pain interfered with the following? General activity 9 Relation with others 9 Enjoyment of life 10 What TIME of day is your pain at its worst? morning , daytime, evening, and night Sleep (in general) Fair  Pain is worse with: walking, bending, sitting, inactivity, standing, and some activites Pain improves with: medication and injections Relief from Meds: 2  Family History  Problem Relation Age of Onset   Hypertension Mother    Hypertension Father    Heart attack Father 55   Heart attack Brother 21   Social History   Socioeconomic History   Marital status: Married    Spouse name: Not on file   Number of children: 4   Years of education: Not on file   Highest education level: Not on file  Occupational History   Not on file  Tobacco Use   Smoking status: Every Day    Packs/day: 0.75    Years: 40.00    Total pack years:  30.00    Types: Cigarettes   Smokeless tobacco: Never  Vaping Use   Vaping Use: Never used  Substance and Sexual Activity   Alcohol use: Yes    Alcohol/week: 1.0 standard drink of alcohol    Types: 1 Standard drinks or equivalent per week    Comment: rarely   Drug use: No   Sexual activity: Not on file  Other Topics Concern   Not on file  Social History Narrative   Not on file   Social Determinants of Health   Financial Resource Strain: Not on file  Food Insecurity: Not on file  Transportation Needs: Not on file  Physical Activity: Not on file  Stress: Not on file  Social Connections: Not on file   Past Surgical History:  Procedure Laterality Date   ABDOMINAL HYSTERECTOMY     ANTERIOR CERVICAL DECOMP/DISCECTOMY FUSION N/A 05/07/2017   Procedure: ANTERIOR CERVICAL DECOMPRESSION/DISCECTOMY FUSION CERVICAL FOUR- CERVICAL FIVE, CERVICAL FIVE- CERVICAL SIX, CERVICAL SIX- CERVICAL SEVEN;  Surgeon: Newman Pies, MD;  Location: [place];  Service: Neurosurgery;  Laterality: N/A;   ANTERIOR CERVICAL DECOMP/DISCECTOMY FUSION N/A 06/21/2017   Procedure: Revision Anterior Instrumentation Cervical four-seven;  Surgeon: Newman Pies, MD;  Location: [place];  Service: Neurosurgery;  Laterality: N/A;   APPENDECTOMY     CATARACT EXTRACTION, BILATERAL     CERVICAL SPINE SURGERY     COLONOSCOPY  02/2013   EXCISION/RELEASE BURSA HIP Right 09/17/2019   Procedure: Right hip bursectomy;  Surgeon: Gaynelle Arabian, MD;  Location: [facility];  Service: Orthopedics;  Laterality: Right;   HEMORRHOID SURGERY  03/2013   LUMBAR SPINE SURGERY     x 3   POSTERIOR CERVICAL FUSION/FORAMINOTOMY N/A 06/21/2017   Procedure: POSTERIOR CERVICAL FUSION WITH LATERAL MASS FIXATION CERVICAL FOUR- CERVICAL FIVE, CERVICAL FIVE- CERVICAL SIX, CERVICAL SIX- CERVICAL SEVEN;  Surgeon: Newman Pies, MD;  Location: [place];  Service: Neurosurgery;  Laterality: N/A;   Past Surgical History:  Procedure Laterality Date   ABDOMINAL  HYSTERECTOMY     ANTERIOR CERVICAL DECOMP/DISCECTOMY FUSION N/A 05/07/2017   Procedure: ANTERIOR CERVICAL DECOMPRESSION/DISCECTOMY FUSION CERVICAL FOUR- CERVICAL FIVE, CERVICAL FIVE- CERVICAL SIX, CERVICAL SIX- CERVICAL SEVEN;  Surgeon: Newman Pies, MD;  Location: [place];  Service: Neurosurgery;  Laterality: N/A;   ANTERIOR CERVICAL DECOMP/DISCECTOMY FUSION N/A 06/21/2017   Procedure: Revision Anterior Instrumentation Cervical four-seven;  Surgeon: Newman Pies, MD;  Location: [place];  Service: Neurosurgery;  Laterality: N/A;   APPENDECTOMY     CATARACT EXTRACTION, BILATERAL     CERVICAL SPINE SURGERY     COLONOSCOPY  02/2013   EXCISION/RELEASE BURSA HIP Right 09/17/2019   Procedure: Right hip bursectomy;  Surgeon: Gaynelle Arabian, MD;  Location[facility];  Service: Orthopedics;  Laterality: Right;   HEMORRHOID SURGERY  03/2013   LUMBAR SPINE SURGERY     x 3   POSTERIOR CERVICAL FUSION/FORAMINOTOMY N/A 06/21/2017   Procedure: POSTERIOR CERVICAL FUSION WITH LATERAL MASS FIXATION CERVICAL FOUR- CERVICAL FIVE, CERVICAL FIVE- CERVICAL SIX, CERVICAL SIX- CERVICAL SEVEN;  Surgeon: Newman Pies, MD;  Location: [place];  Service: Neurosurgery;  Laterality: N/A;   Past Medical History:  Diagnosis Date   Anxiety    Chronic pain syndrome    Dr Jamse Belfast    COPD (chronic obstructive pulmonary disease) ([place])    Degenerative disc disease    Degenerative joint disease    Depression    Essential hypertension, benign    GERD (gastroesophageal reflux disease)    Hyperlipidemia    Hypothyroidism    Insomnia    Osteoarthritis    Palpitations    Restless leg syndrome    TIA (transient ischemic attack) unsure   BP 139/82   Pulse (!) 58   Temp 97.6 F (36.4 C)   Ht '5\' 4"'$  (1.626 m)   Wt 109 lb (49.4 kg)   SpO2 95%   BMI 18.71 kg/m   Opioid Risk Score:   Fall Risk Score:  `1  Depression screen [facility] 2/9     08/08/2022    9:27 AM 06/20/2022   11:40 AM 12/16/2021   11:12 AM 11/29/2021     1:51 PM 09/23/2020   11:29 AM 06/10/2018   10:38 AM 11/16/2016    9:49 AM  Depression screen PHQ 2/9  Decreased Interest 1 1 0 0 0 0 0  Down, Depressed, Hopeless 1 1 0 0 0 0 0  PHQ - 2 Score 2 2 0 0 0 0 0    Review of Systems  Musculoskeletal:  Positive for back pain.       Left foot pain  All other systems reviewed and are negative.      Objective:   Physical Exam  Thin elderly female hard of hearing in no acute distress Mood and affect are appropriate Ambulates without assistive device no evidence of toe drag or knee instability Motor strength is 5/5 bilateral hip flexor knee  extensor ankle dorsiflexor Negative straight leg raise bilaterally Sensation mildly diminished left L4-L5 S1 distribution compared to the right side although she is still sensate at this level. Deep tendon reflexes are 2+ bilateral knees and absent at the ankles bilaterally No tenderness over the greater trochanters of the hip Positive thigh thrust test on the left side FABERs caused a stretching sensation bilateral groin area       Assessment & Plan:   1.  Lumbar postlaminectomy syndrome with chronic low back and intermittent radicular pain left lower extremity.  Last CT myelogram did not show any significant compressive lesions.  She gets a shorter term relief with S1 epidural injections we will trial with caudal epidural to see if we can extend the duration of pain relief. Would not recommend narcotic analgesics given her prior history Will increase Lyrica to 50 mg 3 times daily

## 2022-08-13 NOTE — Progress Notes (Unsigned)
Cardiology Office Note   Date:  08/15/2022   ID:  Emily Leonard, DOB 10/17/1945, MRN [id number]  PCP:  Jene Every, MD  Cardiologist:   Minus Breeding, MD   Chief Complaint  Patient presents with   Chest Pain       History of Present Illness: Emily Leonard is a 76 y.o. female who presents for follow up of chest discomfort and palpitations. She's had a stress test in 2015 which was unremarkable.  Since I last saw her she continues to be limited by back pain.  She has scattered sporadic chest discomfort but on careful questioning this is no change compared to previous.  Its not associate with nausea vomiting or diaphoresis.  There is no neck or arm discomfort.  There is no associated shortness of breath, PND or orthopnea.  She has her chronic dyspnea but she unfortunately continues to smoke cigarettes.  She does some mild chores but is mostly limited by the back pain so is not doing a lot.  Past Medical History:  Diagnosis Date   Anxiety    Chronic pain syndrome    Dr Jamse Belfast    COPD (chronic obstructive pulmonary disease) ([place])    Degenerative disc disease    Degenerative joint disease    Depression    Essential hypertension, benign    GERD (gastroesophageal reflux disease)    Hyperlipidemia    Hypothyroidism    Insomnia    Osteoarthritis    Palpitations    Restless leg syndrome    TIA (transient ischemic attack) unsure    Past Surgical History:  Procedure Laterality Date   ABDOMINAL HYSTERECTOMY     ANTERIOR CERVICAL DECOMP/DISCECTOMY FUSION N/A 05/07/2017   Procedure: ANTERIOR CERVICAL DECOMPRESSION/DISCECTOMY FUSION CERVICAL FOUR- CERVICAL FIVE, CERVICAL FIVE- CERVICAL SIX, CERVICAL SIX- CERVICAL SEVEN;  Surgeon: Newman Pies, MD;  Location: [place];  Service: Neurosurgery;  Laterality: N/A;   ANTERIOR CERVICAL DECOMP/DISCECTOMY FUSION N/A 06/21/2017   Procedure: Revision Anterior Instrumentation Cervical four-seven;  Surgeon: Newman Pies, MD;   Location: [place];  Service: Neurosurgery;  Laterality: N/A;   APPENDECTOMY     CATARACT EXTRACTION, BILATERAL     CERVICAL SPINE SURGERY     COLONOSCOPY  02/2013   EXCISION/RELEASE BURSA HIP Right 09/17/2019   Procedure: Right hip bursectomy;  Surgeon: Gaynelle Arabian, MD;  [facility];  Service: Orthopedics;  Laterality: Right;   HEMORRHOID SURGERY  03/2013   LUMBAR SPINE SURGERY     x 3   POSTERIOR CERVICAL FUSION/FORAMINOTOMY N/A 06/21/2017   Procedure: POSTERIOR CERVICAL FUSION WITH LATERAL MASS FIXATION CERVICAL FOUR- CERVICAL FIVE, CERVICAL FIVE- CERVICAL SIX, CERVICAL SIX- CERVICAL SEVEN;  Surgeon: Newman Pies, MD;  Location: [place];  Service: Neurosurgery;  Laterality: N/A;     Current Outpatient Medications  Medication Sig Dispense Refill   albuterol (VENTOLIN HFA) 108 (90 Base) MCG/ACT inhaler Inhale 2 puffs into the lungs every 6 (six) hours as needed for wheezing or shortness of breath.     alendronate (FOSAMAX) 70 MG tablet Take 1 tablet by mouth once a week.     ammonium lactate (LAC-HYDRIN) 12 % lotion      aspirin EC 81 MG tablet Take 81 mg by mouth daily.     budesonide-formoterol (SYMBICORT) 160-4.5 MCG/ACT inhaler Inhale into the lungs.     budesonide-formoterol (SYMBICORT) 160-4.5 MCG/ACT inhaler Inhale into the lungs.     Calcium Carbonate-Vitamin D (CALCIUM-VITAMIN D3) 600-125 MG-UNIT TABS Take 600 mg by mouth  daily.     diltiazem (CARDIZEM) 30 MG tablet TAKE 1 TABLET BY MOUTH AT BEDTIME (Patient taking differently: Take 30 mg by mouth daily.) 90 tablet 1   DULoxetine (CYMBALTA) 30 MG capsule Take 2 capsules by mouth daily.     hydrochlorothiazide (HYDRODIURIL) 25 MG tablet Take 1 tablet by mouth every morning.     LAGEVRIO 200 MG CAPS capsule SMARTSIG:4 Capsule(s) By Mouth Every 12 Hours     levothyroxine (SYNTHROID) 88 MCG tablet Take 1 tablet by mouth daily.     levothyroxine (SYNTHROID, LEVOTHROID) 100 MCG tablet Take 100 mcg by mouth daily before breakfast.    1   lidocaine (LIDODERM) 5 % Place 2 patches onto the skin daily. Remove & Discard patch within 12 hours or as directed by MD (Patient taking differently: Place 2 patches onto the skin daily as needed (Lower back and shoulder). Remove & Discard patch within 12 hours or as directed by MD) 60 patch 2   Magnesium 400 MG TABS Take 1 tablet by mouth daily.     magnesium oxide (MAG-OX) 400 (241.3 Mg) MG tablet Take 1 tablet by mouth daily.     meloxicam (MOBIC) 7.5 MG tablet Take 7.5 mg by mouth daily.     pravastatin (PRAVACHOL) 40 MG tablet Take 1 tablet by mouth daily.     pravastatin (PRAVACHOL) 80 MG tablet TAKE 1 TABLET BY MOUTH AT BEDTIME 90 tablet 0   pregabalin (LYRICA) 50 MG capsule Take 1 capsule (50 mg total) by mouth 3 (three) times daily. 90 capsule 1   traMADol (ULTRAM) 50 MG tablet Take by mouth.     traZODone (DESYREL) 150 MG tablet Take 150 mg by mouth at bedtime.   0   No current facility-administered medications for this visit.    Allergies:   Erythromycin, Latex, Morphine, Morphine sulfate, Clozapine, and Fentanyl   ROS:  Please see the history of present illness.   Otherwise, review of systems are positive for none.   All other systems are reviewed and negative.    PHYSICAL EXAM: VS:  BP 106/62 (BP Location: Left Arm, Patient Position: Sitting, Cuff Size: Normal)   Pulse 66   Ht '5\' 4"'$  (1.626 m)   Wt 109 lb 6.4 oz (49.6 kg)   SpO2 95%   BMI 18.78 kg/m  , BMI Body mass index is 18.78 kg/m. GENERAL:  Well appearing NECK:  No jugular venous distention, waveform within normal limits, carotid upstroke brisk and symmetric, no bruits, no thyromegaly LUNGS:  Clear to auscultation bilaterally CHEST:  Unremarkable HEART:  PMI not displaced or sustained,S1 and S2 within normal limits, no S3, no S4, no clicks, no rubs, no murmurs ABD:  Flat, positive bowel sounds normal in frequency in pitch, no bruits, no rebound, no guarding, no midline pulsatile mass, no hepatomegaly, no  splenomegaly EXT:  2 plus pulses throughout, no edema, no cyanosis no clubbing   EKG:  EKG is  ordered today. Sinus rhythm, rate 65,  intervals within normal limits, early transition lead V2 with possible old anteroseptal infarct, there is a leftward axis.  Axis shift and anterior Q waves are new since previous.    Recent Labs: No results found for requested labs within last 365 days.    Lipid Panel    Component Value Date/Time   CHOL  03/23/2009 0630    160        ATP III CLASSIFICATION:  <200     mg/dL   Desirable  200-239  mg/dL   Borderline High  >=240    mg/dL   High          TRIG 100 03/23/2009 0630   HDL 44 03/23/2009 0630   CHOLHDL 3.6 03/23/2009 0630   VLDL 20 03/23/2009 0630   LDLCALC  03/23/2009 0630    96        Total Cholesterol/HDL:CHD Risk Coronary Heart Disease Risk Table                     Men   Women  1/2 Average Risk   3.4   3.3  Average Risk       5.0   4.4  2 X Average Risk   9.6   7.1  3 X Average Risk  23.4   11.0        Use the calculated Patient Ratio above and the CHD Risk Table to determine the patient's CHD Risk.        ATP III CLASSIFICATION (LDL):  <100     mg/dL   Optimal  100-129  mg/dL   Near or Above                    Optimal  130-159  mg/dL   Borderline  160-189  mg/dL   High  >190     mg/dL   Very High      Wt Readings from Last 3 Encounters:  08/15/22 109 lb 6.4 oz (49.6 kg)  08/08/22 109 lb (49.4 kg)  06/20/22 108 lb (49 kg)      Other studies Reviewed: Additional studies/ records that were reviewed today include: Labs. Review of the above records demonstrates:    Please see elsewhere in the note.     ASSESSMENT AND PLAN:  PALPITATIONS:   She is not to be bothered by these.  No change in therapy.  CAROTID PLAQUE:    This was mild.  No change in therapy.   CORONARY CALCIUM: I carefully asked her about any new symptoms that she does not have any.  This can continue to be managed.  DYSLIPIDEMIA:  LDL was 85 with  an HDL of 84.  No change in therapy.    TOBACCO ABUSE:    I again talked to her about stopping smoking.  She again made the joke that if she stopped smoking she started drinking.   ABNORMAL EKG: I am concerned about her abnormal EKG.  She is pretty sedentary so she would not be able to bring on symptoms.  She has an axis shift.  I would like to check an echocardiogram.    She has some right axis deviation.Current medicines are reviewed at length with the patient today.  The patient does not have concerns regarding medicines.  The following changes have been made:  None  Labs/ tests ordered today include: None  Orders Placed This Encounter  Procedures   EKG 12-Lead   ECHOCARDIOGRAM COMPLETE      Disposition:   FU with me in one year or sooner if needed.   Signed, Minus Breeding, MD  08/15/2022 2:07 PM    [place]

## 2022-08-15 ENCOUNTER — Encounter: Payer: Self-pay | Admitting: Cardiology

## 2022-08-15 ENCOUNTER — Ambulatory Visit: Payer: Medicare HMO | Attending: Cardiology | Admitting: Cardiology

## 2022-08-15 VITALS — BP 106/62 | HR 66 | Ht 64.0 in | Wt 109.4 lb

## 2022-08-15 DIAGNOSIS — I6529 Occlusion and stenosis of unspecified carotid artery: Secondary | ICD-10-CM

## 2022-08-15 DIAGNOSIS — R002 Palpitations: Secondary | ICD-10-CM

## 2022-08-15 DIAGNOSIS — Z720 Tobacco use: Secondary | ICD-10-CM | POA: Diagnosis not present

## 2022-08-15 DIAGNOSIS — E785 Hyperlipidemia, unspecified: Secondary | ICD-10-CM

## 2022-08-15 DIAGNOSIS — R9431 Abnormal electrocardiogram [ECG] [EKG]: Secondary | ICD-10-CM

## 2022-08-15 NOTE — Patient Instructions (Signed)
Medication Instructions:  No changes *If you need a refill on your cardiac medications before your next appointment, please call your pharmacy*   Lab Work: None ordered If you have labs (blood work) drawn today and your tests are completely normal, you will receive your results only by: [place] (if you have MyChart) OR A paper copy in the mail If you have any lab test that is abnormal or we need to change your treatment, we will call you to review the results.   Testing/Procedures: Your physician has requested that you have an echocardiogram. Echocardiography is a painless test that uses sound waves to create images of your heart. It provides your doctor with information about the size and shape of your heart and how well your heart's chambers and valves are working. You may receive an ultrasound enhancing agent through an IV if needed to better visualize your heart during the echo.This procedure takes approximately one hour. There are no restrictions for this procedure. This will take place at the [address], [address].     Follow-Up: At [facility], you and your health needs are our priority.  As part of our continuing mission to provide you with exceptional heart care, we have created designated Provider Care Teams.  These Care Teams include your primary Cardiologist (physician) and Advanced Practice Providers (APPs -  Physician Assistants and Nurse Practitioners) who all work together to provide you with the care you need, when you need it.  We recommend signing up for the patient portal called "MyChart".  Sign up information is provided on this After Visit Summary.  MyChart is used to connect with patients for Virtual Visits (Telemedicine).  Patients are able to view lab/test results, encounter notes, upcoming appointments, etc.  Non-urgent messages can be sent to your provider as well.   To learn more about what you can do with MyChart, go to  [email or web address].    Your next appointment:   4 month(s)  The format for your next appointment:   In Person  Provider:   Dr. Percival Spanish  Important Information About Sugar

## 2022-09-04 ENCOUNTER — Ambulatory Visit (HOSPITAL_COMMUNITY): Payer: Medicare HMO | Attending: Cardiology

## 2022-09-04 DIAGNOSIS — R9431 Abnormal electrocardiogram [ECG] [EKG]: Secondary | ICD-10-CM

## 2022-09-04 LAB — ECHOCARDIOGRAM COMPLETE
Area-P 1/2: 3.42 cm2
S' Lateral: 2.1 cm

## 2022-09-12 ENCOUNTER — Other Ambulatory Visit: Payer: Self-pay | Admitting: Physical Medicine & Rehabilitation

## 2022-09-13 ENCOUNTER — Telehealth: Payer: Self-pay | Admitting: *Deleted

## 2022-09-13 NOTE — Telephone Encounter (Addendum)
-----   Message from Charlett Blake, MD sent at 09/12/2022  4:42 PM EST ----- Looks like Arnetha Massy is prescribing Gabapentin, please tell pt not to take Lyrica any longer since this duplicate treatment .  Please d/c Lyrica I have spoken with Mrs Marasco and told her to stop the lyrica.

## 2022-09-21 ENCOUNTER — Telehealth: Payer: Self-pay

## 2022-09-21 DIAGNOSIS — M961 Postlaminectomy syndrome, not elsewhere classified: Secondary | ICD-10-CM

## 2022-09-21 NOTE — Progress Notes (Deleted)
  PROCEDURE RECORD [facility]   Name: Emily Leonard DOB:24-Aug-1946 MRN: [id number]  Date:09/21/2022  Physician: Alysia Penna, MD    Nurse/CMA: Jorja Loa MA  Allergies:  Allergies  Allergen Reactions   Erythromycin Swelling    Other reaction(s): Not available   Latex Other (See Comments)    Blisters Other reaction(s): Not available   Morphine     Other reaction(s): Not available   Morphine Sulfate Other (See Comments)    Hallucinations/deep sleep   Clozapine Rash   Fentanyl Nausea Only    "Feels like you are a hundred miles away when talking to her"    Consent Signed: {yes no:314532}  Is patient diabetic? {yes no:314532}  CBG today? ***  Pregnant: {yes no:314532} LMP: No LMP recorded. Patient has had a hysterectomy. (age 55-55)  Anticoagulants: {Yes/No:19989} Anti-inflammatory: {Yes/No:19989} Antibiotics: {Yes/No:19989}  Procedure: Epidural Steroid Injection -Caudal   Position: Prone Start Time: ***  End Time: ***  Fluoro Time: ***  RN/CMA Marry Kusch MA Desirie Minteer MA    Time      BP      Pulse      Respirations      O2 Sat      S/S      Pain Level       D/C home with ***, [name], D/C instructions reviewed, and sits independently.

## 2022-09-21 NOTE — Telephone Encounter (Signed)
Caudal injection pending denial until 12/14. Need more information. Is patient doing PT, home exercises or functional rehab program?

## 2022-09-22 ENCOUNTER — Encounter: Payer: Medicare HMO | Admitting: Physical Medicine & Rehabilitation

## 2022-09-22 ENCOUNTER — Other Ambulatory Visit: Payer: Self-pay

## 2022-09-27 ENCOUNTER — Other Ambulatory Visit: Payer: Self-pay | Admitting: Physical Medicine & Rehabilitation

## 2022-10-18 ENCOUNTER — Other Ambulatory Visit: Payer: Self-pay | Admitting: Physical Medicine & Rehabilitation

## 2022-10-18 ENCOUNTER — Telehealth: Payer: Self-pay | Admitting: Physical Medicine & Rehabilitation

## 2022-10-18 NOTE — Telephone Encounter (Signed)
Patient called and LVM 1/2@ 3:30pm states she goes to physical therapy at [facility] in [facility]

## 2022-11-23 ENCOUNTER — Encounter: Payer: Medicare HMO | Attending: Physical Medicine & Rehabilitation | Admitting: Physical Medicine & Rehabilitation

## 2022-11-23 ENCOUNTER — Encounter: Payer: Self-pay | Admitting: Physical Medicine & Rehabilitation

## 2022-11-23 VITALS — BP 150/78 | HR 59 | Ht 64.0 in | Wt 107.0 lb

## 2022-11-23 DIAGNOSIS — M25562 Pain in left knee: Secondary | ICD-10-CM | POA: Diagnosis present

## 2022-11-23 MED ORDER — LIDOCAINE HCL 1 % IJ SOLN
5.0000 mL | Freq: Once | INTRAMUSCULAR | Status: AC
  Administered 2022-11-23: 5 mL

## 2022-11-23 MED ORDER — BETAMETHASONE SOD PHOS & ACET 6 (3-3) MG/ML IJ SUSP
6.0000 mg | Freq: Once | INTRAMUSCULAR | Status: AC
  Administered 2022-11-23: 6 mg via INTRA_ARTICULAR

## 2022-11-23 NOTE — Progress Notes (Signed)
Subjective:    Patient ID: Emily Leonard, female    DOB: 12/13/1945, 77 y.o.   MRN: [id number]  HPI Chief complaint left knee pain 77 yo female with L2-L5 fusion and chronic lumbar pain due to postlaminectomy syndrome.  She is doing actually quite well with her low back at the present time. The patient does not have any sciatic type discomfort radiating down her legs.  Patient states that over the last several months she has had some episodes of catching in the left knee.  She has gone through outpatient therapy.  She has tried Voltaren gel without much improvement. Patient denies falls or trauma. Pain Inventory Average Pain 8 Pain Right Now 8 My pain is sharp and stabbing  In the last 24 hours, has pain interfered with the following? General activity 8 Relation with others 8 Enjoyment of life 10 What TIME of day is your pain at its worst? morning , daytime, evening, and night Sleep (in general) Fair  Pain is worse with: walking, bending, sitting, standing, and some activites Pain improves with: therapy/exercise, medication, and injections Relief from Meds: 5  Family History  Problem Relation Age of Onset   Hypertension Mother    Hypertension Father    Heart attack Father 61   Heart attack Brother 49   Social History   Socioeconomic History   Marital status: Married    Spouse name: Not on file   Number of children: 4   Years of education: Not on file   Highest education level: Not on file  Occupational History   Not on file  Tobacco Use   Smoking status: Every Day    Packs/day: 0.75    Years: 40.00    Total pack years: 30.00    Types: Cigarettes   Smokeless tobacco: Never  Vaping Use   Vaping Use: Never used  Substance and Sexual Activity   Alcohol use: Yes    Alcohol/week: 1.0 standard drink of alcohol    Types: 1 Standard drinks or equivalent per week    Comment: rarely   Drug use: No   Sexual activity: Not on file  Other Topics Concern   Not on file   Social History Narrative   Not on file   Social Determinants of Health   Financial Resource Strain: Not on file  Food Insecurity: Not on file  Transportation Needs: Not on file  Physical Activity: Not on file  Stress: Not on file  Social Connections: Not on file   Past Surgical History:  Procedure Laterality Date   ABDOMINAL HYSTERECTOMY     ANTERIOR CERVICAL DECOMP/DISCECTOMY FUSION N/A 05/07/2017   Procedure: ANTERIOR CERVICAL DECOMPRESSION/DISCECTOMY FUSION CERVICAL FOUR- CERVICAL FIVE, CERVICAL FIVE- CERVICAL SIX, CERVICAL SIX- CERVICAL SEVEN;  Surgeon: Newman Pies, MD;  Location: [place];  Service: Neurosurgery;  Laterality: N/A;   ANTERIOR CERVICAL DECOMP/DISCECTOMY FUSION N/A 06/21/2017   Procedure: Revision Anterior Instrumentation Cervical four-seven;  Surgeon: Newman Pies, MD;  Location: [place];  Service: Neurosurgery;  Laterality: N/A;   APPENDECTOMY     CATARACT EXTRACTION, BILATERAL     CERVICAL SPINE SURGERY     COLONOSCOPY  02/2013   EXCISION/RELEASE BURSA HIP Right 09/17/2019   Procedure: Right hip bursectomy;  Surgeon: Gaynelle Arabian, MD;  Location: [facility];  Service: Orthopedics;  Laterality: Right;   HEMORRHOID SURGERY  03/2013   LUMBAR SPINE SURGERY     x 3   POSTERIOR CERVICAL FUSION/FORAMINOTOMY N/A 06/21/2017   Procedure: POSTERIOR CERVICAL FUSION WITH  LATERAL MASS FIXATION CERVICAL FOUR- CERVICAL FIVE, CERVICAL FIVE- CERVICAL SIX, CERVICAL SIX- CERVICAL SEVEN;  Surgeon: Newman Pies, MD;  Location: [place];  Service: Neurosurgery;  Laterality: N/A;   Past Surgical History:  Procedure Laterality Date   ABDOMINAL HYSTERECTOMY     ANTERIOR CERVICAL DECOMP/DISCECTOMY FUSION N/A 05/07/2017   Procedure: ANTERIOR CERVICAL DECOMPRESSION/DISCECTOMY FUSION CERVICAL FOUR- CERVICAL FIVE, CERVICAL FIVE- CERVICAL SIX, CERVICAL SIX- CERVICAL SEVEN;  Surgeon: Newman Pies, MD;  Location: [place];  Service: Neurosurgery;  Laterality: N/A;   ANTERIOR CERVICAL  DECOMP/DISCECTOMY FUSION N/A 06/21/2017   Procedure: Revision Anterior Instrumentation Cervical four-seven;  Surgeon: Newman Pies, MD;  Location: [place];  Service: Neurosurgery;  Laterality: N/A;   APPENDECTOMY     CATARACT EXTRACTION, BILATERAL     CERVICAL SPINE SURGERY     COLONOSCOPY  02/2013   EXCISION/RELEASE BURSA HIP Right 09/17/2019   Procedure: Right hip bursectomy;  Surgeon: Gaynelle Arabian, MD;  Location[facility];  Service: Orthopedics;  Laterality: Right;   HEMORRHOID SURGERY  03/2013   LUMBAR SPINE SURGERY     x 3   POSTERIOR CERVICAL FUSION/FORAMINOTOMY N/A 06/21/2017   Procedure: POSTERIOR CERVICAL FUSION WITH LATERAL MASS FIXATION CERVICAL FOUR- CERVICAL FIVE, CERVICAL FIVE- CERVICAL SIX, CERVICAL SIX- CERVICAL SEVEN;  Surgeon: Newman Pies, MD;  Location: [place];  Service: Neurosurgery;  Laterality: N/A;   Past Medical History:  Diagnosis Date   Anxiety    Chronic pain syndrome    Dr Jamse Belfast    COPD (chronic obstructive pulmonary disease) ([place])    Degenerative disc disease    Degenerative joint disease    Depression    Essential hypertension, benign    GERD (gastroesophageal reflux disease)    Hyperlipidemia    Hypothyroidism    Insomnia    Osteoarthritis    Palpitations    Restless leg syndrome    TIA (transient ischemic attack) unsure   Ht '5\' 4"'$  (1.626 m)   Wt 107 lb (48.5 kg)   BMI 18.37 kg/m   Opioid Risk Score:   Fall Risk Score:  `1  Depression screen [facility] 2/9     08/08/2022    9:27 AM 06/20/2022   11:40 AM 12/16/2021   11:12 AM 11/29/2021    1:51 PM 09/23/2020   11:29 AM 06/10/2018   10:38 AM 11/16/2016    9:49 AM  Depression screen PHQ 2/9  Decreased Interest 1 1 0 0 0 0 0  Down, Depressed, Hopeless 1 1 0 0 0 0 0  PHQ - 2 Score 2 2 0 0 0 0 0      Review of Systems  Musculoskeletal:  Positive for back pain and gait problem.  All other systems reviewed and are negative.     Objective:   Physical Exam  Lumbar spine no tenderness to  palpation Lumbar range of motion reduced with flexion extension Left knee has no evidence of effusion erythema.  There is medial joint line tenderness no tenderness over the patellar or quadricep tendon.  There is fullness and some moderate discomfort to palpation in the popliteal fossa on the left side Right knee no pain with palpation along the joint line no pain in the posterior fossa Bilateral knees have normal stability. She ambulates without assistive device.      Assessment & Plan:  1.  Lumbar postlaminectomy syndrome with chronic pain actually doing quite well in this regard.  She is not showing any signs of sciatic discomfort. 2.  Left knee pain  likely arthritic versus meniscal.  No signs of significant effusion no erythema no history of trauma.  She has failed physical therapy as well as Voltaren gel.  Would recommend knee injection, will send for x-ray.  Follow-up in 6 weeks Knee injection Left  Without US guidance  Indication:Left Knee pain not relieved by medication management and other conservative care.  Informed consent was obtained after describing risks and benefits of the procedure with the patient, this includes bleeding, bruising, infection and medication side effects. The patient wishes to proceed and has given written consent. The patient was placed in a recumbent position. The medial aspect of the knee was marked and prepped with Betadine and alcohol. It was then entered with a 25-gauge 1-1/2 inch needle and 1 mL of 1% lidocaine was injected into the skin and subcutaneous tissue. Then another 25g 1.5 in needle was inserted into the knee joint. After negative draw back for blood, a solution containing one ML of '6mg'$  per mL betamethasone and 3 mL of 1% lidocaine were injected. The patient tolerated the procedure well. Post procedure instructions were given.

## 2022-11-23 NOTE — Patient Instructions (Signed)

## 2022-12-12 NOTE — Progress Notes (Signed)
Cardiology Office Note   Date:  12/14/2022   ID:  Emily, Leonard Oct 17, 1945, MRN [id number]  PCP:  Emily Every, MD  Cardiologist:   Minus Breeding, MD   Chief Complaint  Patient presents with   Cough       History of Present Illness: Emily Leonard is a 77 y.o. female who presents for follow up of chest discomfort and palpitations. She's had a stress test in 2015 which was unremarkable.  At the last visit I ordered an echo to follow up on her abnormal EKG.  This was unremarkable.  She has no new cardiovascular complaints.  She is not having any chest discomfort, neck or arm discomfort.  She is not having any palpitations, presyncope or syncope.  She has had no weight gain or edema.  She unfortunately is having a nonproductive cough for the last 2 weeks.  Her husband is try to get her primary physician but she has canceled 2 appointments.  She says she might be had a little fever.  She is not having any chills.  She is not having any weight gain or edema.  She continues to smoke cigarettes.  She is very limited by back pain.  She is also having some neck and bilateral arm pain.   Past Medical History:  Diagnosis Date   Anxiety    Chronic pain syndrome    Dr Jamse Belfast    COPD (chronic obstructive pulmonary disease) ([place])    Degenerative disc disease    Degenerative joint disease    Depression    Essential hypertension, benign    GERD (gastroesophageal reflux disease)    Hyperlipidemia    Hypothyroidism    Insomnia    Osteoarthritis    Palpitations    Restless leg syndrome    TIA (transient ischemic attack) unsure    Past Surgical History:  Procedure Laterality Date   ABDOMINAL HYSTERECTOMY     ANTERIOR CERVICAL DECOMP/DISCECTOMY FUSION N/A 05/07/2017   Procedure: ANTERIOR CERVICAL DECOMPRESSION/DISCECTOMY FUSION CERVICAL FOUR- CERVICAL FIVE, CERVICAL FIVE- CERVICAL SIX, CERVICAL SIX- CERVICAL SEVEN;  Surgeon: Newman Pies, MD;  Location: [place];  Service:  Neurosurgery;  Laterality: N/A;   ANTERIOR CERVICAL DECOMP/DISCECTOMY FUSION N/A 06/21/2017   Procedure: Revision Anterior Instrumentation Cervical four-seven;  Surgeon: Newman Pies, MD;  Location: [place];  Service: Neurosurgery;  Laterality: N/A;   APPENDECTOMY     CATARACT EXTRACTION, BILATERAL     CERVICAL SPINE SURGERY     COLONOSCOPY  02/2013   EXCISION/RELEASE BURSA HIP Right 09/17/2019   Procedure: Right hip bursectomy;  Surgeon: Gaynelle Arabian, MD;  [facility];  Service: Orthopedics;  Laterality: Right;   HEMORRHOID SURGERY  03/2013   LUMBAR SPINE SURGERY     x 3   POSTERIOR CERVICAL FUSION/FORAMINOTOMY N/A 06/21/2017   Procedure: POSTERIOR CERVICAL FUSION WITH LATERAL MASS FIXATION CERVICAL FOUR- CERVICAL FIVE, CERVICAL FIVE- CERVICAL SIX, CERVICAL SIX- CERVICAL SEVEN;  Surgeon: Newman Pies, MD;  Location: [place];  Service: Neurosurgery;  Laterality: N/A;     Current Outpatient Medications  Medication Sig Dispense Refill   albuterol (VENTOLIN HFA) 108 (90 Base) MCG/ACT inhaler Inhale 2 puffs into the lungs Leonard 6 (six) hours as needed for wheezing or shortness of breath.     alendronate (FOSAMAX) 70 MG tablet Take 1 tablet by mouth once a week.     ammonium lactate (LAC-HYDRIN) 12 % lotion      aspirin EC 81 MG tablet Take 81 mg  by mouth daily.     budesonide-formoterol (SYMBICORT) 160-4.5 MCG/ACT inhaler Inhale into the lungs.     budesonide-formoterol (SYMBICORT) 160-4.5 MCG/ACT inhaler Inhale into the lungs.     Calcium Carbonate-Vitamin D (CALCIUM-VITAMIN D3) 600-125 MG-UNIT TABS Take 600 mg by mouth daily.     diltiazem (CARDIZEM) 30 MG tablet TAKE 1 TABLET BY MOUTH AT BEDTIME (Patient taking differently: Take 30 mg by mouth daily.) 90 tablet 1   DULoxetine (CYMBALTA) 30 MG capsule Take 2 capsules by mouth daily.     hydrochlorothiazide (HYDRODIURIL) 25 MG tablet Take 1 tablet by mouth Leonard morning.     LAGEVRIO 200 MG CAPS capsule SMARTSIG:4 Capsule(s) By Mouth  Leonard 12 Hours     levothyroxine (SYNTHROID, LEVOTHROID) 100 MCG tablet Take 100 mcg by mouth daily before breakfast.   1   lidocaine (LIDODERM) 5 % Place 2 patches onto the skin daily. Remove & Discard patch within 12 hours or as directed by MD (Patient taking differently: Place 2 patches onto the skin daily as needed (Lower back and shoulder). Remove & Discard patch within 12 hours or as directed by MD) 60 patch 2   Magnesium 400 MG TABS Take 1 tablet by mouth daily.     magnesium oxide (MAG-OX) 400 (241.3 Mg) MG tablet Take 1 tablet by mouth daily.     meloxicam (MOBIC) 7.5 MG tablet Take 7.5 mg by mouth daily.     pravastatin (PRAVACHOL) 80 MG tablet TAKE 1 TABLET BY MOUTH AT BEDTIME 90 tablet 0   traMADol (ULTRAM) 50 MG tablet Take by mouth.     traZODone (DESYREL) 150 MG tablet Take 150 mg by mouth at bedtime.   0   No current facility-administered medications for this visit.    Allergies:   Erythromycin, Latex, Morphine, Morphine sulfate, Clozapine, and Fentanyl   ROS:  Please see the history of present illness.   Otherwise, review of systems are positive for none.   All other systems are reviewed and negative.    PHYSICAL EXAM: VS:  BP (!) 140/86   Pulse 66   Ht '5\' 4"'$  (1.626 m)   Wt 105 lb (47.6 kg)   SpO2 94%   BMI 18.02 kg/m  , BMI Body mass index is 18.02 kg/m. GENERAL:  Well appearing NECK:  No jugular venous distention, waveform within normal limits, carotid upstroke brisk and symmetric, no bruits, no thyromegaly LUNGS: Decreased breath sounds bilaterally with coarse crackles particular at the bilateral bases CHEST:  Unremarkable HEART:  PMI not displaced or sustained,S1 and S2 within normal limits, no S3, no S4, no clicks, no rubs, no murmurs ABD:  Flat, positive bowel sounds normal in frequency in pitch, no bruits, no rebound, no guarding, no midline pulsatile mass, no hepatomegaly, no splenomegaly EXT:  2 plus pulses throughout, no edema, no cyanosis no  clubbing  EKG:  EKG is  ordered today. Sinus rhythm, rate 61,  intervals within normal limits, early transition lead V2 with possible old anteroseptal infarct, there is a leftward axis.  Axis shift and anterior Q waves are new since previous.    Recent Labs: No results found for requested labs within last 365 days.    Lipid Panel    Component Value Date/Time   CHOL  03/23/2009 0630    160        ATP III CLASSIFICATION:  <200     mg/dL   Desirable  200-239  mg/dL   Borderline High  >=240  mg/dL   High          TRIG 100 03/23/2009 0630   HDL 44 03/23/2009 0630   CHOLHDL 3.6 03/23/2009 0630   VLDL 20 03/23/2009 0630   LDLCALC  03/23/2009 0630    96        Total Cholesterol/HDL:CHD Risk Coronary Heart Disease Risk Table                     Men   Women  1/2 Average Risk   3.4   3.3  Average Risk       5.0   4.4  2 X Average Risk   9.6   7.1  3 X Average Risk  23.4   11.0        Use the calculated Patient Ratio above and the CHD Risk Table to determine the patient's CHD Risk.        ATP III CLASSIFICATION (LDL):  <100     mg/dL   Optimal  100-129  mg/dL   Near or Above                    Optimal  130-159  mg/dL   Borderline  160-189  mg/dL   High  >190     mg/dL   Very High      Wt Readings from Last 3 Encounters:  12/14/22 105 lb (47.6 kg)  11/23/22 107 lb (48.5 kg)  08/15/22 109 lb 6.4 oz (49.6 kg)      Other studies Reviewed: Additional studies/ records that were reviewed today include: Labs. Review of the above records demonstrates:    Please see elsewhere in the note.     ASSESSMENT AND PLAN:  PALPITATIONS: She is not particular bothered by these.  No change in therapy.   CAROTID PLAQUE:    This was mild.  No further imaging.  She will continue with risk reduction.    CORONARY CALCIUM: She has no new symptoms.  No change in therapy.  DYSLIPIDEMIA:  LDL was 85 when I saw her last but I will try to get the most recent labs from her primary provider.    TOBACCO ABUSE:     She is unable to stop smoking.   ABNORMAL EKG: Echo was unremarkable.  No change in therapy.  EKG today demonstrated no acute abnormalities.  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  However, there was no regional wall motion abnormality on the recent echo.  COUGH: I will check a PA and lateral chest x-ray.  She has some right axis deviation.Current medicines are reviewed at length with the patient today.  The patient does not have concerns regarding medicines.  The following changes have been made:  None  Labs/ tests ordered today include:   Orders Placed This Encounter  Procedures   DG Chest 2 View   EKG 12-Lead      Disposition:   FU with me in 12 months.     Signed, Minus Breeding, MD  12/14/2022 11:58 AM    [facility]

## 2022-12-14 ENCOUNTER — Ambulatory Visit: Payer: Medicare HMO | Attending: Cardiology | Admitting: Cardiology

## 2022-12-14 ENCOUNTER — Ambulatory Visit
Admission: RE | Admit: 2022-12-14 | Discharge: 2022-12-14 | Disposition: A | Discharge status: Home / Self Care | Payer: Medicare HMO | Source: Ambulatory Visit | Attending: Cardiology | Admitting: Cardiology

## 2022-12-14 ENCOUNTER — Encounter: Payer: Self-pay | Admitting: Cardiology

## 2022-12-14 VITALS — BP 140/86 | HR 66 | Ht 64.0 in | Wt 105.0 lb

## 2022-12-14 DIAGNOSIS — E785 Hyperlipidemia, unspecified: Secondary | ICD-10-CM | POA: Diagnosis not present

## 2022-12-14 DIAGNOSIS — R931 Abnormal findings on diagnostic imaging of heart and coronary circulation: Secondary | ICD-10-CM

## 2022-12-14 DIAGNOSIS — R059 Cough, unspecified: Secondary | ICD-10-CM

## 2022-12-14 DIAGNOSIS — I6529 Occlusion and stenosis of unspecified carotid artery: Secondary | ICD-10-CM

## 2022-12-14 DIAGNOSIS — R002 Palpitations: Secondary | ICD-10-CM | POA: Diagnosis not present

## 2022-12-14 DIAGNOSIS — Z720 Tobacco use: Secondary | ICD-10-CM

## 2022-12-14 NOTE — Patient Instructions (Signed)
Medication Instructions:  Your physician recommends that you continue on your current medications as directed. Please refer to the Current Medication list given to you today.  *If you need a refill on your cardiac medications before your next appointment, please call your pharmacy*  Testing/Procedures: A chest x-ray takes a picture of the organs and structures inside the chest, including the heart, lungs, and blood vessels. This test can show several things, including, whether the heart is enlarges; whether fluid is building up in the lungs; and whether pacemaker / defibrillator leads are still in place.  [facility]: [place] appointment needed M-F 8-4  Follow-Up: At [facility], you and your health needs are our priority.  As part of our continuing mission to provide you with exceptional heart care, we have created designated Provider Care Teams.  These Care Teams include your primary Cardiologist (physician) and Advanced Practice Providers (APPs -  Physician Assistants and Nurse Practitioners) who all work together to provide you with the care you need, when you need it.  We recommend signing up for the patient portal called "MyChart".  Sign up information is provided on this After Visit Summary.  MyChart is used to connect with patients for Virtual Visits (Telemedicine).  Patients are able to view lab/test results, encounter notes, upcoming appointments, etc.  Non-urgent messages can be sent to your provider as well.   To learn more about what you can do with MyChart, go to [email or web address].    Your next appointment:   12 month(s)  Provider:   Minus Breeding, MD

## 2022-12-15 ENCOUNTER — Encounter: Payer: Self-pay | Admitting: Cardiology

## 2022-12-21 ENCOUNTER — Telehealth: Payer: Self-pay | Admitting: Physical Medicine & Rehabilitation

## 2022-12-21 NOTE — Telephone Encounter (Signed)
Patient called requesting a back injection for her next visit scheduled 3/21

## 2023-01-04 ENCOUNTER — Encounter: Payer: Medicare HMO | Admitting: Physical Medicine & Rehabilitation

## 2023-01-04 ENCOUNTER — Telehealth: Payer: Self-pay

## 2023-01-04 NOTE — Telephone Encounter (Signed)
PA was denied for Emily Leonard for SI injection a peer to peer can be done for her I will leave the paper on your desk that explains the denial for you to review

## 2023-01-05 ENCOUNTER — Encounter: Payer: Self-pay | Admitting: Physician Assistant

## 2023-01-05 NOTE — Progress Notes (Signed)
Dr. Marlou Porch fielded a call from outside hospital about pt who had near syncope, SBP in the 59s - treated with IVF with improvement, troponin minimally elevated. Dr. Marlou Porch feels this was likely due to the low BP but recommends early ER f/u. The patient had not had any chest pain. I have sent a staff message to our office's scheduling team requesting a follow-up appointment, and our office will call the patient with this information.

## 2023-01-17 NOTE — Telephone Encounter (Signed)
Called patient to let her know Josem Kaufmann was approved for [facility]

## 2023-01-19 ENCOUNTER — Encounter: Payer: Medicare HMO | Attending: Physical Medicine & Rehabilitation | Admitting: Physical Medicine & Rehabilitation

## 2023-01-19 ENCOUNTER — Encounter: Payer: Self-pay | Admitting: Physical Medicine & Rehabilitation

## 2023-01-19 VITALS — BP 167/84 | HR 51 | Temp 97.8°F | Ht 64.0 in | Wt 107.0 lb

## 2023-01-19 DIAGNOSIS — M5417 Radiculopathy, lumbosacral region: Secondary | ICD-10-CM | POA: Diagnosis not present

## 2023-01-19 MED ORDER — LIDOCAINE HCL (PF) 1 % IJ SOLN
2.0000 mL | Freq: Once | INTRAMUSCULAR | Status: AC
  Administered 2023-01-19: 4 mL

## 2023-01-19 MED ORDER — LIDOCAINE HCL 1 % IJ SOLN
5.0000 mL | Freq: Once | INTRAMUSCULAR | Status: AC
  Administered 2023-01-19: 10 mL

## 2023-01-19 MED ORDER — IOHEXOL 180 MG/ML  SOLN
3.0000 mL | Freq: Once | INTRAMUSCULAR | Status: AC
  Administered 2023-01-19: 3 mL via INTRAVENOUS

## 2023-01-19 MED ORDER — DEXAMETHASONE SODIUM PHOSPHATE 10 MG/ML IJ SOLN
10.0000 mg | Freq: Once | INTRAMUSCULAR | Status: AC
  Administered 2023-01-19: 20 mg via INTRAVENOUS

## 2023-01-19 NOTE — Progress Notes (Signed)
  PROCEDURE RECORD [facility]   Name: Emily Leonard DOB:July 18, 1946 MRN: [id number]  Date:01/19/2023  Physician: Claudette Laws, MD    Nurse/CMA: Nedra Hai, CMA  Allergies:  Allergies  Allergen Reactions   Erythromycin Swelling    Other reaction(s): Not available   Latex Other (See Comments)    Blisters Other reaction(s): Not available   Morphine     Other reaction(s): Not available   Morphine Sulfate Other (See Comments)    Hallucinations/deep sleep   Clozapine Rash   Fentanyl Nausea Only    "Feels like you are a hundred miles away when talking to her"    Consent Signed: Yes.    Is patient diabetic? No.  CBG today? .  Pregnant: No. LMP: No LMP recorded. Patient has had a hysterectomy. (age 29-55)  Anticoagulants: no Anti-inflammatory: no Antibiotics: no  Procedure: Bilateral S1 Epidural Steroid Injection  Position: Prone Start Time: 10:55 am  End Time: 11:01 am  Fluoro Time: 26  RN/CMA Nedra Hai, CMA Sonya S    Time 10:35 am 11:05    BP 167/84 146/83    Pulse 51 55    Respirations 16 16    O2 Sat 97 96    S/S 6 6    Pain Level 9/10 6/10     D/C home with husband, [name], D/C instructions reviewed, and sits independently.

## 2023-01-19 NOTE — Patient Instructions (Signed)

## 2023-01-19 NOTE — Progress Notes (Signed)
Bilateral S1 transforaminal epidural steroid injection under fluoroscopic guidance with contrast enhancement  Indication: Lumbosacral radiculitis is not relieved by medication management or other conservative care and interfering with self-care and mobility.  Patient originally scheduled for caudal epidural steroid injection.  This was last performed in June 2021 however the patient states it was not effective.  Patient has fusion in the lumbar area.  Discussed with patient recommend bilateral S1 transforaminal epidural injections  Informed consent was obtained after describing risk and benefits of the procedure with the patient, this includes bleeding, bruising, infection, paralysis and medication side effects.  The patient wishes to proceed and has given written consent.  Patient was placed in prone position.  The lumbar area was marked and prepped with Betadine.  It was entered with a 25-gauge 1-1/2 inch needle and one mL of 1% lidocaine was injected into the skin and subcutaneous tissue.  Then a 22-gauge 3.5 in spinal needle was inserted into the left S1  foramen under AP, lateral, and oblique view.  Once needle tip was within the foramen on lateral views an dnor exceeding 6 o clock position on the pedical on AP viewed Isovue 200 was inected x 75ml Then a solution containing one mL of 10 mg per mL dexamethasone and 2 mL of 1% lidocaine was injected. The same procedure and technique was used to inject the R 1 sacral foramen The patient tolerated procedure well.  Post procedure instructions were given.  Please see post procedure form.  On Gabapentin rx per Hildred Priest, no pregabalin per this office  RTC 3 mo to eval efficacy of injection

## 2023-01-28 DIAGNOSIS — R059 Cough, unspecified: Secondary | ICD-10-CM | POA: Insufficient documentation

## 2023-01-28 NOTE — Progress Notes (Unsigned)
Cardiology Office Note:   Date:  01/29/2023  ID:  LOUNA GLAUSER, DOB 06/15/1946, MRN [id number]  History of Present Illness:   Emily Leonard is a 77 y.o. female who presents for follow up of chest discomfort and palpitations. She's had a stress test in 2015 which was unremarkable.   She had a cough at the last visit but had no abnormalities on CXR.     Since I last saw her she has been hospitalized twice at an outside hospital.  I reviewed both of these records.  In March there was hypoxic respiratory failure.  She had presyncope.  Her daughter says she was told she was in A-fib by EMS but looking through the hospital records there is no mention of this.  She was hypotensive and HCTZ was discontinued.  I do see that her troponin was very borderline elevated.  There was a question of a TIA.  However I do not see any positive neurologic findings.  She did have a CT to rule out pulmonary embolism and there was none noted.  There were no significant pulmonary abnormalities.  She was discharged without oxygen.  She was then seen in the emergency room after this for lower abdominal pain that is ongoing.  She did have plaque noted in her abdominal vessels but there was not a dedicated angiogram.  Unfortunately she continues to have abdominal discomfort.  This has been somewhat constant.  She is lost about 6 pounds even though she is eating okay.  This is more in her right upper quadrant.  It is sharp.  It can be 7 out of 10 in intensity.  She was given a lidocaine patch but that has not helped.  It is not worse necessarily with movement.  She is not having any change in her bowel movements and has not had any hematochezia.  She has had no diarrhea or constipation.  She has had no fevers or chills.  She is not describing any palpitations, presyncope or syncope.  She has had no new chest pressure, neck or arm discomfort.  She has had no edema  ROS: Very hard of hearing. Otherwise as stated in the HPI and negative  for all other systems.  Studies Reviewed:    EKG:  NA    Risk Assessment/Calculations:         Physical Exam:   VS:  BP (!) 144/74 Comment: Pt's daughter said her PCP took her off blood pressure medicine on 01/07/2023  Pulse 65   Ht 5\' 4"  (1.626 m)   Wt 106 lb 3.2 oz (48.2 kg)   SpO2 94%   BMI 18.23 kg/m    Wt Readings from Last 3 Encounters:  01/29/23 106 lb 3.2 oz (48.2 kg)  01/19/23 107 lb (48.5 kg)  12/14/22 105 lb (47.6 kg)     GEN: Well nourished, well developed in no acute distress NECK: No JVD; No carotid bruits CARDIAC: RRR, no murmurs, rubs, gallops RESPIRATORY:  Clear to auscultation without rales, wheezing or rhonchi  ABDOMEN: Soft, non-tender, non-distended EXTREMITIES:  No edema; No deformity   ASSESSMENT AND PLAN:   ABDOMINAL PAIN: Etiology of this is not clear.  Workup thus far been negative.  I am going to do a dedicated angiogram of her abdominal vessels.   Otherwise per her primary provider and she might need referral to GI.    CAROTID PLAQUE:    This was mild.  No further imaging.     CORONARY CALCIUM: She  has no new symptoms.   She did have mildly elevated troponin in the hospital but is not describing any chest discomfort.  I will consider further ischemia workup after evaluating her GI complaints.   DYSLIPIDEMIA:   I would like to get her most recent labs from her PCP.    TOBACCO ABUSE:     She cannot quit smoking.   ARRHYTHMIA: Her daughter specifically mentions atrial fibrillation.  I do not see any mention of this however in the notes from the hospitals.  I extensively reviewed these notes  ( (Greater than 40 minutes reviewing all data with greater than 50% face to face with the patient).  I will apply a 4-week monitor       Signed, Rollene Rotunda, MD

## 2023-01-29 ENCOUNTER — Encounter: Payer: Self-pay | Admitting: Cardiology

## 2023-01-29 ENCOUNTER — Ambulatory Visit: Payer: Medicare HMO | Attending: Cardiology | Admitting: Cardiology

## 2023-01-29 VITALS — BP 144/74 | HR 65 | Ht 64.0 in | Wt 106.2 lb

## 2023-01-29 DIAGNOSIS — E785 Hyperlipidemia, unspecified: Secondary | ICD-10-CM

## 2023-01-29 DIAGNOSIS — R931 Abnormal findings on diagnostic imaging of heart and coronary circulation: Secondary | ICD-10-CM

## 2023-01-29 DIAGNOSIS — R002 Palpitations: Secondary | ICD-10-CM | POA: Diagnosis not present

## 2023-01-29 DIAGNOSIS — R058 Other specified cough: Secondary | ICD-10-CM | POA: Diagnosis not present

## 2023-01-29 NOTE — Patient Instructions (Addendum)
Medication Instructions:  Your physician recommends that you continue on your current medications as directed. Please refer to the Current Medication list given to you today.  *If you need a refill on your cardiac medications before your next appointment, please call your pharmacy*   Testing/Procedures: Non-Cardiac CT Angiography (CTA) abdomen and pelvis, is a special type of CT scan that uses a computer to produce multi-dimensional views of major blood vessels throughout the body. In CT angiography, a contrast material is injected through an IV to help visualize the blood vessels.    [employer] Cardiac Event Monitor Instructions  Your physician has requested you wear your cardiac event monitor for 30 days. [employer] may call or text to confirm a shipping address. The monitor will be sent to a land address via UPS. [employer] will not ship a monitor to a PO BOX. It typically takes 3-5 days to receive your monitor after it has been enrolled. [employer] will assist with USPS tracking if your package is delayed. The telephone number for [employer] is [phone]. Once you have received your monitor, please review the enclosed instructions. Instruction tutorials can also be viewed under help and settings on the enclosed cell phone. Your monitor has already been registered assigning a specific monitor serial # to you.  Billing and Self Pay Discount Information  [employer] has been provided the insurance information we had on file for you.  If your insurance has been updated, please call [employer] at [phone] to provide them with your updated insurance information.   [employer] offers a discounted Self Pay option for patients who have insurance that does not cover their cardiac event monitor or patients without insurance.  The discounted cost of a Self Pay Cardiac Event Monitor would be $225.00 , if the patient contacts [employer] at [phone] within 7 days of applying the monitor to  make payment arrangements.  If the patient does not contact [employer] within 7 days of applying the monitor, the cost of the cardiac event monitor will be $350.00.  Applying the monitor  Remove cell phone from case and turn it on. The cell phone works as IT consultant and needs to be within [employer] of you at all times. The cell phone will need to be charged on a daily basis. We recommend you plug the cell phone into the enclosed charger at your bedside table every night.  Monitor batteries: You will receive two monitor batteries labelled #1 and #2. These are your recorders. Plug battery #2 onto the second connection on the enclosed charger. Keep one battery on the charger at all times. This will keep the monitor battery deactivated. It will also keep it fully charged for when you need to switch your monitor batteries. A small light will be blinking on the battery emblem when it is charging. The light on the battery emblem will remain on when the battery is fully charged.  Open package of a Monitor strip. Insert battery #1 into black hood on strip and gently squeeze monitor battery onto connection as indicated in instruction booklet. Set aside while preparing skin.  Choose location for your strip, vertical or horizontal, as indicated in the instruction booklet. Shave to remove all hair from location. There cannot be any lotions, oils, powders, or colognes on skin where monitor is to be applied. Wipe skin clean with enclosed Saline wipe. Dry skin completely.  Peel paper labeled #1 off the back of the Monitor strip exposing the adhesive. Place the monitor on the chest in the vertical  or horizontal position shown in the instruction booklet. One arrow on the monitor strip must be pointing upward. Carefully remove paper labeled #2, attaching remainder of strip to your skin. Try not to create any folds or wrinkles in the strip as you apply it.  Firmly press and release the circle in the center  of the monitor battery. You will hear a small beep. This is turning the monitor battery on. The heart emblem on the monitor battery will light up every 5 seconds if the monitor battery in turned on and connected to the patient securely. Do not push and hold the circle down as this turns the monitor battery off. The cell phone will locate the monitor battery. A screen will appear on the cell phone checking the connection of your monitor strip. This may read poor connection initially but change to good connection within the next minute. Once your monitor accepts the connection you will hear a series of 3 beeps followed by a climbing crescendo of beeps. A screen will appear on the cell phone showing the two monitor strip placement options. Touch the picture that demonstrates where you applied the monitor strip.  Your monitor strip and battery are waterproof. You are able to shower, bathe, or swim with the monitor on. They just ask you do not submerge deeper than 3 feet underwater. We recommend removing the monitor if you are swimming in a lake, river, or ocean.  Your monitor battery will need to be switched to a fully charged monitor battery approximately once a week. The cell phone will alert you of an action which needs to be made.  On the cell phone, tap for details to reveal connection status, monitor battery status, and cell phone battery status. The green dots indicates your monitor is in good status. A red dot indicates there is something that needs your attention.  To record a symptom, click the circle on the monitor battery. In 30-60 seconds a list of symptoms will appear on the cell phone. Select your symptom and tap save. Your monitor will record a sustained or significant arrhythmia regardless of you clicking the button. Some patients do not feel the heart rhythm irregularities. [employer] will notify us of any serious or critical events.  Refer to instruction booklet for  instructions on switching batteries, changing strips, the Do not disturb or Pause features, or any additional questions.  Call [employer] at [phone], to confirm your monitor is transmitting and record your baseline. They will answer any questions you may have regarding the monitor instructions at that time.  Returning the monitor to [employer]  Place all equipment back into blue box. Peel off strip of paper to expose adhesive and close box securely. There is a prepaid UPS shipping label on this box. Drop in a UPS drop box, or at a UPS facility like [employer]. You may also contact [employer] to arrange UPS to pick up monitor package at your home.   Follow-Up: At [facility], you and your health needs are our priority.  As part of our continuing mission to provide you with exceptional heart care, we have created designated Provider Care Teams.  These Care Teams include your primary Cardiologist (physician) and Advanced Practice Providers (APPs -  Physician Assistants and Nurse Practitioners) who all work together to provide you with the care you need, when you need it.  We recommend signing up for the patient portal called "MyChart".  Sign up information is provided on this After Visit Summary.  MyChart is used to connect with patients for Virtual Visits (Telemedicine).  Patients are able to view lab/test results, encounter notes, upcoming appointments, etc.  Non-urgent messages can be sent to your provider as well.   To learn more about what you can do with MyChart, go to [email or web address].    Your next appointment:   6 week(s)  Provider:   Rollene Rotunda, MD

## 2023-01-30 ENCOUNTER — Encounter: Payer: Self-pay | Admitting: Cardiology

## 2023-01-30 NOTE — Telephone Encounter (Signed)
(  Please see previous MyChart messages)  Spoke with pt's daughter and she just feels like something is not right. Her mom keeps having pain in her stomach too. This is the 3rd ER visit within the last month. Today the pt had an allergic reaction/anaphylaxis, which is what she is being treated for= has had epi, benadryl, etc.   She does not feel like her mother should leave yet due to her symptoms; she just feels like something is not right. We went over the lab work from [facility] together. WBC's are elevated- she is thinking of asking for a urine to rule out bladder infection (pt went to the dr a few weeks ago and the urine showed many bacteria and did another urine but did not hear back) Maybe asking for a hemoccult test. Maybe a lactic acid?   She said she would really like to talk to Dr Antoine Poche if able.  Told her that I would send this to the provider.

## 2023-01-31 ENCOUNTER — Ambulatory Visit (HOSPITAL_COMMUNITY)
Admission: RE | Admit: 2023-01-31 | Discharge: 2023-01-31 | Disposition: A | Discharge status: Home / Self Care | Payer: Medicare HMO | Source: Ambulatory Visit | Attending: Cardiology | Admitting: Cardiology

## 2023-01-31 DIAGNOSIS — R002 Palpitations: Secondary | ICD-10-CM | POA: Insufficient documentation

## 2023-01-31 DIAGNOSIS — E785 Hyperlipidemia, unspecified: Secondary | ICD-10-CM | POA: Insufficient documentation

## 2023-01-31 DIAGNOSIS — R931 Abnormal findings on diagnostic imaging of heart and coronary circulation: Secondary | ICD-10-CM | POA: Diagnosis present

## 2023-01-31 DIAGNOSIS — R058 Other specified cough: Secondary | ICD-10-CM

## 2023-01-31 MED ORDER — IOHEXOL 350 MG/ML SOLN
75.0000 mL | Freq: Once | INTRAVENOUS | Status: AC | PRN
  Administered 2023-01-31: 75 mL via INTRAVENOUS

## 2023-02-06 ENCOUNTER — Telehealth: Payer: Self-pay | Admitting: Cardiology

## 2023-02-06 NOTE — Telephone Encounter (Signed)
Aviva Signs, P.A. from [facility] states he would like a call back from Dr. Antoine Poche at his earliest convenience to discuss tests he would like to order for this patient.  Stated that he could have call back and did not need to speak with anyone urgently.

## 2023-02-08 NOTE — Telephone Encounter (Signed)
Left message for PA to call.

## 2023-02-08 NOTE — Telephone Encounter (Signed)
PA returned the call, he has seen ms Pamer for the GI complaints and he would like to do an endoscopy but would like to talk to dr hochrein regarding ischemia workup. His mobile number is [phone]. Aware will have dr hochrein return his call.

## 2023-02-09 NOTE — Telephone Encounter (Signed)
Rollene Rotunda, MD  I did just call and finally got in touch with Aviva Signs who is seeing her for abdominal pain and wants to do colonoscopy.  I think this is very reasonable.  I would like to cancel the vascular study that I have scheduled which was to exclude the small possibility of intestinal ischemia.  She would be at acceptable risk for the planned colonoscopy.  No cardiac ischemia workup is necessary.

## 2023-03-06 ENCOUNTER — Ambulatory Visit: Payer: Medicare HMO | Attending: Cardiology

## 2023-03-06 DIAGNOSIS — R931 Abnormal findings on diagnostic imaging of heart and coronary circulation: Secondary | ICD-10-CM

## 2023-03-06 DIAGNOSIS — R058 Other specified cough: Secondary | ICD-10-CM

## 2023-03-06 DIAGNOSIS — R002 Palpitations: Secondary | ICD-10-CM

## 2023-03-06 DIAGNOSIS — E785 Hyperlipidemia, unspecified: Secondary | ICD-10-CM

## 2023-03-15 DIAGNOSIS — I499 Cardiac arrhythmia, unspecified: Secondary | ICD-10-CM | POA: Insufficient documentation

## 2023-03-15 NOTE — Progress Notes (Signed)
Cardiology Office Note:   Date:  03/16/2023  ID:  Emily Leonard, DOB August 10, 1946, MRN [id number] PCP: Barron Alvine, MD  [facility] Providers Cardiologist:  Rollene Rotunda, MD {  History of Present Illness:   Emily Leonard is a 77 y.o. female who presents for follow up of chest discomfort and palpitations. She's had a stress test in 2015 which was unremarkable.   She had a cough at the last visit but had no abnormalities on CXR.   In March there was hypoxic respiratory failure.  She had presyncope.  Her daughter says she was told she was in A-fib by EMS but looking through the hospital records there is no mention of this.  She was hypotensive and HCTZ was discontinued.  I do see that her troponin was very borderline elevated.  There was a question of a TIA.  However I do not see any positive neurologic findings.  She did have a CT to rule out pulmonary embolism and there was none noted.  There were no significant pulmonary abnormalities.  She was discharged without oxygen.  She was then seen in the emergency room after this for lower abdominal pain that is ongoing.  She did have plaque noted in her abdominal vessels but there was not a dedicated angiogram.  She has had severe abdominal pain.    She has been found to have colon cancer and is due to have colectomy.  She might also need to have cholecystectomy at the same time.  She also has a skin cancer that is going to be managed.  Still the etiology of the abdominal pain is not entirely clear and might be back problems.  She is very limited from this.  Of note she has not had any chest pain but she did have the elevated troponin mention below when she was in the hospital previously.  This was when she had respiratory failure.  I had managed this conservatively up till now.  She still has chronic dyspnea with exertion but she is not describing PND or orthopnea.  She describes her heart skipping but had no sustained arrhythmias on the monitor.   There was no evidence of fibrillation.  She wore a monitor after the last visit.  ROS: As stated in the HPI and negative for all other systems.  Studies Reviewed:    EKG: Sinus bradycardia, rate 51, axis within normal limits, intervals within normal limits, possible old anteroseptal infarct.   Risk Assessment/Calculations:       Physical Exam:   VS:  BP (!) 140/80 (BP Location: Left Arm, Patient Position: Sitting, Cuff Size: Normal)   Pulse (!) 51   Ht 5\' 4"  (1.626 m)   Wt 104 lb 3.2 oz (47.3 kg)   SpO2 94%   BMI 17.89 kg/m    Wt Readings from Last 3 Encounters:  03/16/23 104 lb 3.2 oz (47.3 kg)  01/29/23 106 lb 3.2 oz (48.2 kg)  01/19/23 107 lb (48.5 kg)     GEN: Well nourished, well developed in no acute distress NECK: No JVD; No carotid bruits CARDIAC: RRR, no murmurs, rubs, gallops RESPIRATORY:  Clear to auscultation without rales, wheezing or rhonchi  ABDOMEN: Soft, non-tender, non-distended EXTREMITIES:  No edema; No deformity   ASSESSMENT AND PLAN:   ABDOMINAL PAIN: The etiology is still not entirely clear but she is undergoing surgery as above.   CAROTID PLAQUE:     This was mild.  No change in therapy.   CORONARY  CALCIUM:   She does have an abnormal EKG.  She is going for surgery and had elevated troponins.  She needs preoperative stress testing according to ACC/AHA guidelines but was not able to walk on the treadmill.  She will have a [employer]. \  DYSLIPIDEMIA:   I do not have the most recent lipids.  LDL goal will be in the 70s.  TOBACCO ABUSE:     We had discussions many times she is not able to quit smoking.   ARRHYTHMIA:   She has some PVCs but no sustained arrhythmias.  There was no fibrillation.  No change in therapy.  PREOP: The patient needs Lexiscan Myoview prior to surgery.  Preoperative clearance will be based on this.       Signed, Rollene Rotunda, MD

## 2023-03-16 ENCOUNTER — Ambulatory Visit: Payer: Medicare HMO | Attending: Cardiology | Admitting: Cardiology

## 2023-03-16 ENCOUNTER — Encounter: Payer: Self-pay | Admitting: Cardiology

## 2023-03-16 ENCOUNTER — Encounter (HOSPITAL_COMMUNITY): Payer: Self-pay | Admitting: *Deleted

## 2023-03-16 ENCOUNTER — Telehealth (HOSPITAL_COMMUNITY): Payer: Self-pay | Admitting: *Deleted

## 2023-03-16 VITALS — BP 140/80 | HR 51 | Ht 64.0 in | Wt 104.2 lb

## 2023-03-16 DIAGNOSIS — R931 Abnormal findings on diagnostic imaging of heart and coronary circulation: Secondary | ICD-10-CM

## 2023-03-16 DIAGNOSIS — R7989 Other specified abnormal findings of blood chemistry: Secondary | ICD-10-CM

## 2023-03-16 DIAGNOSIS — E785 Hyperlipidemia, unspecified: Secondary | ICD-10-CM

## 2023-03-16 DIAGNOSIS — I499 Cardiac arrhythmia, unspecified: Secondary | ICD-10-CM

## 2023-03-16 DIAGNOSIS — R1084 Generalized abdominal pain: Secondary | ICD-10-CM

## 2023-03-16 DIAGNOSIS — R0602 Shortness of breath: Secondary | ICD-10-CM

## 2023-03-16 NOTE — Patient Instructions (Signed)
Medication Instructions:  Continue same medications *If you need a refill on your cardiac medications before your next appointment, please call your pharmacy*   Lab Work: None ordered   Testing/Procedures: Emily Leonard   Follow-Up: At [facility], you and your health needs are our priority.  As part of our continuing mission to provide you with exceptional heart care, we have created designated Provider Care Teams.  These Care Teams include your primary Cardiologist (physician) and Advanced Practice Providers (APPs -  Physician Assistants and Nurse Practitioners) who all work together to provide you with the care you need, when you need it.  We recommend signing up for the patient portal called "MyChart".  Sign up information is provided on this After Visit Summary.  MyChart is used to connect with patients for Virtual Visits (Telemedicine).  Patients are able to view lab/test results, encounter notes, upcoming appointments, etc.  Non-urgent messages can be sent to your provider as well.   To learn more about what you can do with MyChart, go to [email or web address].    Your next appointment:  6 months    Provider:  Dr.Hochrein

## 2023-03-16 NOTE — Telephone Encounter (Signed)
My Chart letter sent outlining instructions for upcoming stress test on 03/21/23 at 10:30.

## 2023-03-21 ENCOUNTER — Encounter (HOSPITAL_COMMUNITY): Payer: Self-pay | Admitting: *Deleted

## 2023-03-21 ENCOUNTER — Ambulatory Visit (HOSPITAL_COMMUNITY): Payer: Medicare HMO | Attending: Cardiovascular Disease

## 2023-03-21 VITALS — Ht 64.0 in | Wt 104.0 lb

## 2023-03-21 DIAGNOSIS — R931 Abnormal findings on diagnostic imaging of heart and coronary circulation: Secondary | ICD-10-CM

## 2023-03-21 LAB — MYOCARDIAL PERFUSION IMAGING: Rest Nuclear Isotope Dose: 10 mCi

## 2023-03-21 MED ORDER — TECHNETIUM TC 99M TETROFOSMIN IV KIT
10.0000 | PACK | Freq: Once | INTRAVENOUS | Status: AC | PRN
  Administered 2023-03-21: 10 via INTRAVENOUS

## 2023-03-23 ENCOUNTER — Ambulatory Visit (HOSPITAL_COMMUNITY): Payer: Medicare HMO | Attending: Cardiovascular Disease

## 2023-03-23 ENCOUNTER — Other Ambulatory Visit (HOSPITAL_COMMUNITY): Payer: Self-pay | Admitting: Cardiology

## 2023-03-23 DIAGNOSIS — Z01810 Encounter for preprocedural cardiovascular examination: Secondary | ICD-10-CM

## 2023-03-23 DIAGNOSIS — R931 Abnormal findings on diagnostic imaging of heart and coronary circulation: Secondary | ICD-10-CM

## 2023-03-23 DIAGNOSIS — I499 Cardiac arrhythmia, unspecified: Secondary | ICD-10-CM | POA: Diagnosis present

## 2023-03-23 LAB — MYOCARDIAL PERFUSION IMAGING
Base ST Depression (mm): 0 mm
LV dias vol: 66 mL (ref 46–106)
LV sys vol: 24 mL
Nuc Stress EF: 63 %
Peak HR: 93 {beats}/min
Rest HR: 51 {beats}/min
Rest Nuclear Isotope Dose: 10 mCi
SDS: 0
SRS: 0
SSS: 0
ST Depression (mm): 0 mm
Stress Nuclear Isotope Dose: 32.4 mCi
TID: 1.01

## 2023-03-23 MED ORDER — TECHNETIUM TC 99M TETROFOSMIN IV KIT
32.4000 | PACK | Freq: Once | INTRAVENOUS | Status: AC | PRN
  Administered 2023-03-23: 32.4 via INTRAVENOUS

## 2023-03-23 MED ORDER — REGADENOSON 0.4 MG/5ML IV SOLN
0.4000 mg | Freq: Once | INTRAVENOUS | Status: AC
Start: 2023-03-23 — End: 2023-03-23
  Administered 2023-03-23: 0.4 mg via INTRAVENOUS

## 2023-04-20 ENCOUNTER — Encounter: Payer: Medicare HMO | Admitting: Physical Medicine & Rehabilitation

## 2023-04-24 ENCOUNTER — Encounter: Payer: Self-pay | Admitting: Physical Medicine & Rehabilitation

## 2023-04-26 ENCOUNTER — Telehealth: Payer: Self-pay | Admitting: *Deleted

## 2023-04-26 ENCOUNTER — Encounter: Payer: Medicare HMO | Admitting: Physical Medicine & Rehabilitation

## 2023-04-26 NOTE — Telephone Encounter (Signed)
Patient's daughter would like to know when next injection can be done.  Per last my chart you offered to enter pallative referral offered. Patient's daughter declines at this times.  Patient has appt with surgeon on 05/01/2023.

## 2023-04-27 NOTE — Telephone Encounter (Signed)
Spoke to daughter, this is abdominal pain from colon cancer, will see surgeon and then oncologist.  We discussed that this clinic does not treat abd pain, a referral to anesthesia pain management was offered but daughter will wait until other MDs are seen.  Offered short term rx of tramadol

## 2023-05-01 ENCOUNTER — Encounter: Payer: Self-pay | Admitting: Physical Medicine & Rehabilitation

## 2023-05-14 ENCOUNTER — Other Ambulatory Visit: Payer: Self-pay | Admitting: *Deleted

## 2023-05-14 DIAGNOSIS — M5417 Radiculopathy, lumbosacral region: Secondary | ICD-10-CM

## 2023-05-14 DIAGNOSIS — M961 Postlaminectomy syndrome, not elsewhere classified: Secondary | ICD-10-CM

## 2023-05-18 ENCOUNTER — Encounter: Payer: Medicare HMO | Attending: Physical Medicine & Rehabilitation | Admitting: Physical Medicine & Rehabilitation

## 2023-05-18 DIAGNOSIS — M961 Postlaminectomy syndrome, not elsewhere classified: Secondary | ICD-10-CM | POA: Insufficient documentation

## 2023-05-25 ENCOUNTER — Telehealth: Payer: Self-pay | Admitting: Physical Medicine & Rehabilitation

## 2023-05-25 NOTE — Telephone Encounter (Signed)
Patient complaining she is in a lot of pain and is requesting another injection.  Can we schedule?

## 2023-05-31 ENCOUNTER — Encounter: Payer: Self-pay | Admitting: *Deleted

## 2023-05-31 NOTE — Telephone Encounter (Signed)
Opened in error

## 2023-06-05 ENCOUNTER — Telehealth: Payer: Self-pay | Admitting: *Deleted

## 2023-06-05 ENCOUNTER — Encounter (HOSPITAL_BASED_OUTPATIENT_CLINIC_OR_DEPARTMENT_OTHER): Payer: Medicare HMO | Admitting: Physical Medicine & Rehabilitation

## 2023-06-05 ENCOUNTER — Encounter: Payer: Self-pay | Admitting: Physical Medicine & Rehabilitation

## 2023-06-05 VITALS — BP 171/80 | HR 61 | Ht 64.0 in | Wt 99.0 lb

## 2023-06-05 DIAGNOSIS — M961 Postlaminectomy syndrome, not elsewhere classified: Secondary | ICD-10-CM

## 2023-06-05 NOTE — Telephone Encounter (Signed)
I attempted to reach Emily Leonard about the appointment today but there was no answer at home. I called the mobile number and it is her daughter's number.She would like all calls to go through her number so I have switched it to the main number. It is too late to cancel the appot as they were already on her way but because she saw the referral MD and received an injection this appt is not needed. She has arrived today.Marland Kitchen

## 2023-06-05 NOTE — Progress Notes (Signed)
Subjective:    Patient ID: Emily Leonard, female    DOB: [birth year], 77 y.o.   MRN: [id number]  HPI  77 year old female with history of lumbar and cervical spinal stenosis.  She is approximately 10 years post lumbar fusion L2-S1.  Her interval history since last S1 transforaminal injections in April is significant for diagnosis of colon carcinoma.  She has undergone an ascending colon resection.  She has had negative nodes.  Reviewed oncology notes indicating that additional surgery may be planned.  No adjuvant chemotherapy is planned. The patient has had abdominal pain mainly right lower as well as low back pain.  She has no significant pain going down the legs.  She cannot tell whether pain in the abdomen comes from the back.  It does not seem to be wrapping around. She has seen neurosurgery Dr. Lovell Sheehan who did not feel like her abdominal pain was related to her back.  He evaluated her in June.  The patient has had a CT of the lumbar spine in 2023 which did not show any significant L1-L2 adjacent level disease. I spoke to her surgeon Dr Bobby Rumpf who did not feel the patient's abdominal pain was related to her colon carcinoma. The patient saw Dr. Lorrine Kin from [facility] who performed an epidural injections using dexamethasone yesterday.  The patient does not recall the level that was injected.  I do not see this in electronic medical record.  She does feel like she has had some relief of her usual back pain but still has the abdominal pain. She is also receiving gabapentin for chronic pain. Pain Inventory Average Pain 9 Pain Right Now 8 My pain is constant, sharp, and aching  In the last 24 hours, has pain interfered with the following? General activity 7 Relation with others 10 Enjoyment of life 10 What TIME of day is your pain at its worst? daytime Sleep (in general) Good  Pain is worse with: unsure Pain improves with: medication Relief from Meds: 6  Family  History  Problem Relation Age of Onset   Hypertension Mother    Hypertension Father    Heart attack Father 44   Heart attack Brother 45   Social History   Socioeconomic History   Marital status: Married    Spouse name: Not on file   Number of children: 4   Years of education: Not on file   Highest education level: Not on file  Occupational History   Not on file  Tobacco Use   Smoking status: Every Day    Current packs/day: 0.75    Average packs/day: 0.8 packs/day for 40.0 years (30.0 ttl pk-yrs)    Types: Cigarettes   Smokeless tobacco: Never  Vaping Use   Vaping status: Never Used  Substance and Sexual Activity   Alcohol use: Yes    Alcohol/week: 1.0 standard drink of alcohol    Types: 1 Standard drinks or equivalent per week    Comment: rarely   Drug use: No   Sexual activity: Not on file  Other Topics Concern   Not on file  Social History Narrative   Not on file   Social Determinants of Health   Financial Resource Strain: Low Risk  (01/06/2023)   Received from [facility], [facility]   Overall Financial Resource Strain (CARDIA)    Difficulty of Paying Living Expenses: Not hard at all  Food Insecurity: No Food Insecurity (01/06/2023)   Received from [facility], [facility]   Hunger Vital Sign    Worried About Running Out of Food in the Last Year: Never true    Ran Out of Food in the Last Year: Never true  Transportation Needs: No Transportation Needs (01/06/2023)   Received from [facility], [facility]   [facility] - Transportation    Lack of Transportation (Medical): No    Lack of Transportation (Non-Medical): No  Physical Activity: Inactive (01/06/2023)   Received from [facility], [facility]   Exercise Vital Sign    Days of Exercise per Week: 0 days    Minutes of Exercise per Session: 0 min  Stress: No Stress Concern Present (01/06/2023)   Received from [facility], [facility] of Occupational  Health - Occupational Stress Questionnaire    Feeling of Stress : Only a little  Social Connections: Socially Integrated (01/06/2023)   Received from [facility], [facility]   Social Connection and Isolation Panel [NHANES]    Frequency of Communication with Friends and Family: More than three times a week    Frequency of Social Gatherings with Friends and Family: More than three times a week    Attends Religious Services: 1 to 4 times per year    Active Member of [employer] or Organizations: Yes    Attends Banker Meetings: 1 to 4 times per year    Marital Status: Married   Past Surgical History:  Procedure Laterality Date   ABDOMINAL HYSTERECTOMY     ANTERIOR CERVICAL DECOMP/DISCECTOMY FUSION N/A 05/07/2017   Procedure: ANTERIOR CERVICAL DECOMPRESSION/DISCECTOMY FUSION CERVICAL FOUR- CERVICAL FIVE, CERVICAL FIVE- CERVICAL SIX, CERVICAL SIX- CERVICAL SEVEN;  Surgeon: Tressie Stalker, MD;  Location: [facility] OR;  Service: Neurosurgery;  Laterality: N/A;   ANTERIOR CERVICAL DECOMP/DISCECTOMY FUSION N/A 06/21/2017   Procedure: Revision Anterior Instrumentation Cervical four-seven;  Surgeon: Tressie Stalker, MD;  Location: [facility] OR;  Service: Neurosurgery;  Laterality: N/A;   APPENDECTOMY     CATARACT EXTRACTION, BILATERAL     CERVICAL SPINE SURGERY     COLONOSCOPY  02/2013   EXCISION/RELEASE BURSA HIP Right 09/17/2019   Procedure: Right hip bursectomy;  Surgeon: Ollen Gross, MD;  Location: [facility] ORS;  Service: Orthopedics;  Laterality: Right;   HEMORRHOID SURGERY  03/2013   LUMBAR SPINE SURGERY     x 3   POSTERIOR CERVICAL FUSION/FORAMINOTOMY N/A 06/21/2017   Procedure: POSTERIOR CERVICAL FUSION WITH LATERAL MASS FIXATION CERVICAL FOUR- CERVICAL FIVE, CERVICAL FIVE- CERVICAL SIX, CERVICAL SIX- CERVICAL SEVEN;  Surgeon: Tressie Stalker, MD;  Location: [facility] OR;  Service: Neurosurgery;  Laterality: N/A;   Past Surgical History:  Procedure Laterality Date   ABDOMINAL HYSTERECTOMY      ANTERIOR CERVICAL DECOMP/DISCECTOMY FUSION N/A 05/07/2017   Procedure: ANTERIOR CERVICAL DECOMPRESSION/DISCECTOMY FUSION CERVICAL FOUR- CERVICAL FIVE, CERVICAL FIVE- CERVICAL SIX, CERVICAL SIX- CERVICAL SEVEN;  Surgeon: Tressie Stalker, MD;  Location: [facility] OR;  Service: Neurosurgery;  Laterality: N/A;   ANTERIOR CERVICAL DECOMP/DISCECTOMY FUSION N/A 06/21/2017   Procedure: Revision Anterior Instrumentation Cervical four-seven;  Surgeon: Tressie Stalker, MD;  Location: [facility] OR;  Service: Neurosurgery;  Laterality: N/A;   APPENDECTOMY     CATARACT EXTRACTION, BILATERAL     CERVICAL SPINE SURGERY     COLONOSCOPY  02/2013   EXCISION/RELEASE BURSA HIP Right 09/17/2019   Procedure: Right hip bursectomy;  Surgeon: Ollen Gross, MD;  Location: [facility] ORS;  Service: Orthopedics;  Laterality: Right;   HEMORRHOID SURGERY  03/2013  LUMBAR SPINE SURGERY     x 3   POSTERIOR CERVICAL FUSION/FORAMINOTOMY N/A 06/21/2017   Procedure: POSTERIOR CERVICAL FUSION WITH LATERAL MASS FIXATION CERVICAL FOUR- CERVICAL FIVE, CERVICAL FIVE- CERVICAL SIX, CERVICAL SIX- CERVICAL SEVEN;  Surgeon: Tressie Stalker, MD;  Location: [facility] OR;  Service: Neurosurgery;  Laterality: N/A;   Past Medical History:  Diagnosis Date   Anxiety    Chronic pain syndrome    Dr Quita Skye    COPD (chronic obstructive pulmonary disease) (HCC)    Degenerative disc disease    Degenerative joint disease    Depression    Essential hypertension, benign    GERD (gastroesophageal reflux disease)    Hyperlipidemia    Hypothyroidism    Insomnia    Osteoarthritis    Palpitations    Restless leg syndrome    TIA (transient ischemic attack) unsure   BP (!) 162/96   Pulse 61   Ht 5\' 4"  (1.626 m)   Wt 99 lb (44.9 kg)   SpO2 95%   BMI 16.99 kg/m   Opioid Risk Score:   Fall Risk Score:  `1  Depression screen [facility] 2/9     06/05/2023   10:58 AM 08/08/2022    9:27 AM 06/20/2022   11:40 AM 12/16/2021   11:12 AM 11/29/2021    1:51 PM 09/23/2020   11:29  AM 06/10/2018   10:38 AM  Depression screen PHQ 2/9  Decreased Interest 0 1 1 0 0 0 0  Down, Depressed, Hopeless 0 1 1 0 0 0 0  PHQ - 2 Score 0 2 2 0 0 0 0      Review of Systems  Gastrointestinal:  Positive for abdominal pain.  Musculoskeletal:  Positive for back pain.  All other systems reviewed and are negative.      Objective:   Physical Exam General Cachectic appearing female no acute distress Mood and affect appropriate HEENT hard of hearing Abdomen tenderness to palpation mainly at the right lower quadrant area. No evidence of abdominal distention Lumbar spine has tenderness at the PSIS bilaterally no tenderness along the upper or mid lumbar area. Negative straight leg raising bilaterally   Sacral thrust (prone) : Positive right greater than left  FABER's: Negative Distraction (supine): Negative Thigh thrust test: Negative Patient ambulates without assistive device no evidence of toe drag or knee instability      Assessment & Plan:   1.  History of L2-L5 fusion.  She has no significant adjacent level disease at L1-L2, the patient does have facet degeneration L5-S1 as expected.  Based on radiation patterns of the spine to the abdomen do not feel like the patient's abdominal pain is related to her lumbar spine.  Neurosurgery note from June 2024 reflects this as well. The patient has history of sacroiliac discomfort as well although her exam does not reflect this at the current time. Will check with Dr. Kathlene Cote office to see where the injection was performed yesterday, whether it was S1 or whether a right L1-L2 transforaminal was performed. Patient will follow-up with Dr. Lorrine Kin for now.  I could see her back in the future if needed. Discussed with patient and husband.

## 2023-06-11 ENCOUNTER — Telehealth: Payer: Self-pay | Admitting: Physical Medicine & Rehabilitation

## 2023-06-11 NOTE — Telephone Encounter (Signed)
Patient called in requesting to schedule an injection with Dr Wynn Banker, informed patient that on note 8/20 patient is following with Dr Lorrine Kin , patient said she needs an injection scheduled here . Informed patient I would confirm and we will contact patient back

## 2023-06-11 NOTE — Telephone Encounter (Signed)
Patient called in to schedule an injection with Dr Wynn Banker,

## 2023-06-15 NOTE — Telephone Encounter (Signed)
I spoke with the Mrs Dutil's daughter.

## 2023-07-27 ENCOUNTER — Encounter: Payer: Self-pay | Admitting: Physical Medicine & Rehabilitation

## 2023-09-17 NOTE — Progress Notes (Unsigned)
Cardiology Office Note:   Date:  09/20/2023  ID:  Emily Leonard, DOB Mar 07, 1946, MRN [id number] PCP: Barron Alvine, MD  [facility] Providers Cardiologist:  Rollene Rotunda, MD {  History of Present Illness:   Emily Leonard is a 77 y.o. female who presents for follow up of chest discomfort and palpitations. She's had a stress test in 2015 which was unremarkable.   She had a cough at the last visit but had no abnormalities on CXR.   In March there was hypoxic respiratory failure.  She had presyncope.  Her daughter says she was told she was in A-fib by EMS but looking through the hospital records there is no mention of this.  She was hypotensive and HCTZ was discontinued.  I do see that her troponin was very borderline elevated.  There was a question of a TIA.  However I do not see any positive neurologic findings.  She did have a CT to rule out pulmonary embolism and there was none noted.  There were no significant pulmonary abnormalities.  She was discharged without oxygen.  She was then seen in the emergency room after this for lower abdominal pain that is ongoing.  She did have plaque noted in her abdominal vessels but there was not a dedicated angiogram.  She has had severe abdominal pain.     She has been found to have colon cancer and and she had this resected in [place].  There were no apparent cardiac issues.  She still having chronic abdominal pain and they are wondering whether this could be related to her just severe back pain..    She had a Lexiscan Myoview that was negative for ischemia.  That was earlier this year.  She has had no new chest discomfort, neck or arm discomfort.  She had no new shortness of breath, PND or orthopnea.  She gets around very slowly with her back pain.  ROS: As stated in the HPI and negative for all other systems.  Studies Reviewed:    EKG:   EKG Interpretation Date/Time:  Thursday September 20 2023 11:06:16 EST Ventricular Rate:  56 PR  Interval:  160 QRS Duration:  88 QT Interval:  442 QTC Calculation: 426 R Axis:   82  Text Interpretation: Sinus bradycardia Septal infarct (cited on or before 19-Jun-2017) When compared with ECG of 19-Jun-2017 10:22, Questionable change in QRS axis Non-specific change in ST segment in Anterior leads No significant change since last tracing Confirmed by Rollene Rotunda ([id number]) on 09/20/2023 11:25:03 AM  Risk Assessment/Calculations:         Physical Exam:   VS:  BP (!) 172/88   Pulse (!) 54   Ht 5\' 4"  (1.626 m)   Wt 99 lb (44.9 kg)   SpO2 95%   BMI 16.99 kg/m    Wt Readings from Last 3 Encounters:  09/20/23 99 lb (44.9 kg)  06/05/23 99 lb (44.9 kg)  03/23/23 104 lb (47.2 kg)      GEN: Well nourished, well developed in no acute distress NECK: No JVD; No carotid bruits CARDIAC: RRR, no murmurs, rubs, gallops RESPIRATORY:  Clear to auscultation without rales, wheezing or rhonchi  ABDOMEN: Soft, non-tender, non-distended EXTREMITIES:  No edema; No deformity   ASSESSMENT AND PLAN:   ABDOMINAL PAIN: The etiology of this is still not clear.  There is been no suggestion of a vascular etiology.  No change in therapy or further cardiac testing is indicated.   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  No change in therapy.   CORONARY CALCIUM:    She had a negative perfusion study.  No further workup.  She will continue with primary risk reduction.   DYSLIPIDEMIA:   I have deferred lipid management to her primary provider and I think the goal should be an LDL in the 70s.  I do not have these labs.   TOBACCO ABUSE:   She says the only way she would quit smoking is if she starts drinking.   ARRHYTHMIA:   She has some PVCs but no sustained arrhythmias.  There were no other significant sustained arrhythmias on recent monitoring.  No change in therapy.  HTN: Her blood pressure is elevated.  I will stop her Cardizem and start her Norvasc 2.5 mg daily and she is  going to keep a blood pressure diary.    Follow up with me in 1 year or sooner if needed  Signed, Rollene Rotunda, MD

## 2023-09-20 ENCOUNTER — Encounter: Payer: Self-pay | Admitting: Cardiology

## 2023-09-20 ENCOUNTER — Ambulatory Visit: Payer: Medicare HMO | Attending: Cardiology | Admitting: Cardiology

## 2023-09-20 VITALS — BP 172/88 | HR 54 | Ht 64.0 in | Wt 99.0 lb

## 2023-09-20 DIAGNOSIS — Z720 Tobacco use: Secondary | ICD-10-CM

## 2023-09-20 DIAGNOSIS — E785 Hyperlipidemia, unspecified: Secondary | ICD-10-CM | POA: Diagnosis not present

## 2023-09-20 DIAGNOSIS — I493 Ventricular premature depolarization: Secondary | ICD-10-CM

## 2023-09-20 MED ORDER — AMLODIPINE BESYLATE 2.5 MG PO TABS: 2.5000 mg | ORAL_TABLET | Freq: Every day | ORAL | 3 refills | Status: AC

## 2023-09-20 NOTE — Patient Instructions (Signed)
Medication Instructions:  Stop taking diltiazem. Start taking amlodipine 2.5 mg daily. New script sent. *If you need a refill on your cardiac medications before your next appointment, please call your pharmacy*   Follow-Up: At [facility], you and your health needs are our priority.  As part of our continuing mission to provide you with exceptional heart care, we have created designated Provider Care Teams.  These Care Teams include your primary Cardiologist (physician) and Advanced Practice Providers (APPs -  Physician Assistants and Nurse Practitioners) who all work together to provide you with the care you need, when you need it.  Your next appointment:   12 month(s)  Provider:   Rollene Rotunda, MD     Other Instructions Take and record blood pressure 2x day for several weeks on the BP log from provided by office nurse. Bring results to clinic for drop off and MD review.

## 2023-11-23 ENCOUNTER — Encounter: Payer: Self-pay | Admitting: Physical Medicine & Rehabilitation

## 2023-11-23 ENCOUNTER — Encounter: Payer: Medicare HMO | Attending: Physical Medicine & Rehabilitation | Admitting: Physical Medicine & Rehabilitation

## 2023-11-23 VITALS — BP 158/78 | HR 100 | Ht 64.0 in | Wt 104.0 lb

## 2023-11-23 DIAGNOSIS — R1031 Right lower quadrant pain: Secondary | ICD-10-CM | POA: Insufficient documentation

## 2023-11-23 DIAGNOSIS — M961 Postlaminectomy syndrome, not elsewhere classified: Secondary | ICD-10-CM | POA: Insufficient documentation

## 2023-11-23 DIAGNOSIS — M5134 Other intervertebral disc degeneration, thoracic region: Secondary | ICD-10-CM | POA: Diagnosis present

## 2023-11-23 DIAGNOSIS — G8929 Other chronic pain: Secondary | ICD-10-CM | POA: Diagnosis present

## 2023-11-23 NOTE — Patient Instructions (Signed)
 Ask Dr Crecencio Dodge to send me a note

## 2023-11-23 NOTE — Progress Notes (Signed)
 Subjective:    Patient ID: Emily Leonard, female    DOB: 1946-04-06, 78 y.o.   MRN: [id number]  HPI 78 year old female current smoker with history of lumbar spinal stenosis status post remote L2-S1 fusion.  She has had symptoms consistent with S1 radiculopathy in the past as well as sacroiliac disorder and has had injections for each of these.  Unfortunately she has had limited benefit from these interventions.  He has had some moderate benefit from Lyrica .  She was maintained on tramadol  for many years and more recently has been advanced to hydrocodone  by her primary physician. The patient has been treated for colon cancer underwent partial colectomy no colostomy.  She has followed up with her surgeon for persistent abdominal pain on the right side.  Her surgeon did not feel like her pain was coming from her abdomen but rather related to her back issues.  The patient has had MRI of the thoracic spine and was referred to Dr. Darlis who performs thoracic injections in addition to lumbar.  The patient reportedly underwent some type of injection.  I could not see the patient on epic, the date was 09/21/2023. Thoracic MRI was performed 08/31/2023 with multilevel degenerative disc disease and facet arthropathy small disc protrusion with mild canal narrowing at T12-L1 Modic changes at that same level. Pain Inventory Average Pain 9 Pain Right Now 9 My pain is aching and stinging  In the last 24 hours, has pain interfered with the following? General activity 6 Relation with others 0 Enjoyment of life 9 What TIME of day is your pain at its worst? morning , daytime, evening, and night Sleep (in general) Good  Pain is worse with:  everything, its always there Pain improves with:  nothing Relief from Meds:  na  Family History  Problem Relation Age of Onset   Hypertension Mother    Hypertension Father    Heart attack Father 19   Heart attack Brother 67   Social History   Socioeconomic History    Marital status: Married    Spouse name: Not on file   Number of children: 4   Years of education: Not on file   Highest education level: Not on file  Occupational History   Not on file  Tobacco Use   Smoking status: Every Day    Current packs/day: 0.75    Average packs/day: 0.8 packs/day for 40.0 years (30.0 ttl pk-yrs)    Types: Cigarettes   Smokeless tobacco: Never  Vaping Use   Vaping status: Never Used  Substance and Sexual Activity   Alcohol use: Yes    Alcohol/week: 1.0 standard drink of alcohol    Types: 1 Standard drinks or equivalent per week    Comment: rarely   Drug use: No   Sexual activity: Not on file  Other Topics Concern   Not on file  Social History Narrative   Not on file   Social Drivers of Health   Financial Resource Strain: Low Risk  (01/06/2023)   Received from [facility], [facility]   Overall Financial Resource Strain (CARDIA)    Difficulty of Paying Living Expenses: Not hard at all  Food Insecurity: No Food Insecurity (01/06/2023)   Received from [facility], [facility]   Hunger Vital Sign    Worried About Running Out of Food in the Last Year: Never true    Ran Out of Food in the Last Year: Never true  Transportation Needs: No Transportation  Needs (01/06/2023)   Received from [facility], [facility]   [facility] - Transportation    Lack of Transportation (Medical): No    Lack of Transportation (Non-Medical): No  Physical Activity: Inactive (01/06/2023)   Received from [facility], [facility]   Exercise Vital Sign    Days of Exercise per Week: 0 days    Minutes of Exercise per Session: 0 min  Stress: No Stress Concern Present (01/06/2023)   Received from [facility], [facility] of [facility]ational [facility] - Occupational Stress Questionnaire    Feeling of Stress : Only a little  Social Connections: Socially Integrated (01/06/2023)   Received from [facility], [facility]   Social  Connection and Isolation Panel [NHANES]    Frequency of Communication with Friends and Family: More than three times a week    Frequency of Social Gatherings with Friends and Family: More than three times a week    Attends Religious Services: 1 to 4 times per year    Active Member of [employer] or Organizations: Yes    Attends Banker Meetings: 1 to 4 times per year    Marital Status: Married   Past Surgical History:  Procedure Laterality Date   ABDOMINAL HYSTERECTOMY     ANTERIOR CERVICAL DECOMP/DISCECTOMY FUSION N/A 05/07/2017   Procedure: ANTERIOR CERVICAL DECOMPRESSION/DISCECTOMY FUSION CERVICAL FOUR- CERVICAL FIVE, CERVICAL FIVE- CERVICAL SIX, CERVICAL SIX- CERVICAL SEVEN;  Surgeon: Mavis Purchase, MD;  Location: [facility] OR;  Service: Neurosurgery;  Laterality: N/A;   ANTERIOR CERVICAL DECOMP/DISCECTOMY FUSION N/A 06/21/2017   Procedure: Revision Anterior Instrumentation Cervical four-seven;  Surgeon: Mavis Purchase, MD;  Location: [facility] OR;  Service: Neurosurgery;  Laterality: N/A;   APPENDECTOMY     CATARACT EXTRACTION, BILATERAL     CERVICAL SPINE SURGERY     COLONOSCOPY  02/2013   EXCISION/RELEASE BURSA HIP Right 09/17/2019   Procedure: Right hip bursectomy;  Surgeon: Melodi Lerner, MD;  Location: [facility] ORS;  Service: Orthopedics;  Laterality: Right;   HEMORRHOID SURGERY  03/2013   LUMBAR SPINE SURGERY     x 3   POSTERIOR CERVICAL FUSION/FORAMINOTOMY N/A 06/21/2017   Procedure: POSTERIOR CERVICAL FUSION WITH LATERAL MASS FIXATION CERVICAL FOUR- CERVICAL FIVE, CERVICAL FIVE- CERVICAL SIX, CERVICAL SIX- CERVICAL SEVEN;  Surgeon: Mavis Purchase, MD;  Location: [facility] OR;  Service: Neurosurgery;  Laterality: N/A;   Past Surgical History:  Procedure Laterality Date   ABDOMINAL HYSTERECTOMY     ANTERIOR CERVICAL DECOMP/DISCECTOMY FUSION N/A 05/07/2017   Procedure: ANTERIOR CERVICAL DECOMPRESSION/DISCECTOMY FUSION CERVICAL FOUR- CERVICAL FIVE, CERVICAL FIVE- CERVICAL SIX, CERVICAL SIX-  CERVICAL SEVEN;  Surgeon: Mavis Purchase, MD;  Location: [facility] OR;  Service: Neurosurgery;  Laterality: N/A;   ANTERIOR CERVICAL DECOMP/DISCECTOMY FUSION N/A 06/21/2017   Procedure: Revision Anterior Instrumentation Cervical four-seven;  Surgeon: Mavis Purchase, MD;  Location: [facility] OR;  Service: Neurosurgery;  Laterality: N/A;   APPENDECTOMY     CATARACT EXTRACTION, BILATERAL     CERVICAL SPINE SURGERY     COLONOSCOPY  02/2013   EXCISION/RELEASE BURSA HIP Right 09/17/2019   Procedure: Right hip bursectomy;  Surgeon: Melodi Lerner, MD;  [facility]S;  Service: Orthopedics;  Laterality: Right;   HEMORRHOID SURGERY  03/2013   LUMBAR SPINE SURGERY     x 3   POSTERIOR CERVICAL FUSION/FORAMINOTOMY N/A 06/21/2017   Procedure: POSTERIOR CERVICAL FUSION WITH LATERAL MASS FIXATION CERVICAL FOUR- CERVICAL FIVE, CERVICAL FIVE- CERVICAL SIX, CERVICAL SIX- CERVICAL SEVEN;  Surgeon: Mavis Purchase, MD;  Location: [facility] OR;  Service: Neurosurgery;  Laterality: N/A;   Past Medical History:  Diagnosis Date   Anxiety    Chronic pain syndrome    Dr Prentice Clapper    COPD (chronic obstructive pulmonary disease) (HCC)    Degenerative disc disease    Degenerative joint disease    Depression    Essential hypertension, benign    GERD (gastroesophageal reflux disease)    Hyperlipidemia    Hypothyroidism    Insomnia    Osteoarthritis    Palpitations    Restless leg syndrome    TIA (transient ischemic attack) unsure   BP (!) 158/78   Pulse 100   Ht 5' 4 (1.626 m)   Wt 104 lb (47.2 kg)   SpO2 97%   BMI 17.85 kg/m   Opioid Risk Score:   Fall Risk Score:  `1  Depression screen [facility] 2/9     06/05/2023   10:58 AM 08/08/2022    9:27 AM 06/20/2022   11:40 AM 12/16/2021   11:12 AM 11/29/2021    1:51 PM 09/23/2020   11:29 AM 06/10/2018   10:38 AM  Depression screen PHQ 2/9  Decreased Interest 0 1 1 0 0 0 0  Down, Depressed, Hopeless 0 1 1 0 0 0 0  PHQ - 2 Score 0 2 2 0 0 0 0     Review of Systems   Musculoskeletal:  Positive for back pain.       Pain going down whole left leg  All other systems reviewed and are negative.      Objective:   Physical Exam Abdomen soft nontender she has a well-healed midline abdominal incision. There is no abdominal distention. Lumbar spine has reduced range of motion with flexion and extension of approximately 25 to 50%. She has no tenderness in the thoracic paraspinal area no tenderness in the upper lumbar area there is tenderness around the lumbosacral junction   Sacral thrust (prone) : Positive Lateral compression: Negative FABER's: Negative Distraction (supine): Negative Thigh thrust test: Negative Negative straight leg raise Lower extremity strength is normal      Assessment & Plan:   1.  History of lumbar spinal stenosis also has thoracic degenerative disc and mild stenosis.  As we discussed her surgeon feels like her abdominal pain may be related to her thoracic spine issues although they do not appear to be severe on the MRI scan.  She has been seen by Dr. Darlis from interventional pain.  She reports having no significant relief from the injection performed on 09/21/2023.  She missed her follow-up appointment.  I have asked for a copy of her injection notes. 2.  History of sacroiliac disorder does not appear to be active at this time. 3.  History of S1 radiculopathy does not appear to be symptomatic at this time Pain medication regimen reviewed, family practice is prescribing which I think is appropriate Patient will follow-up with Dr. Darlis, husband states that he is going to the office to make an appointment. If she develops recurrent S1 radiculopathy or sacroiliac disorder I be happy to reevaluate and perform injections although this can also be done by Dr.Eichman I will see her back on a as needed basis

## 2024-08-27 ENCOUNTER — Encounter: Payer: Self-pay | Admitting: Physical Medicine & Rehabilitation

## 2024-08-29 ENCOUNTER — Encounter: Payer: Self-pay | Admitting: Physical Medicine & Rehabilitation

## 2024-08-29 ENCOUNTER — Encounter: Attending: Physical Medicine & Rehabilitation | Admitting: Physical Medicine & Rehabilitation

## 2024-08-29 VITALS — BP 161/97 | HR 62 | Ht 64.0 in | Wt 101.0 lb

## 2024-08-29 DIAGNOSIS — M5417 Radiculopathy, lumbosacral region: Secondary | ICD-10-CM | POA: Diagnosis not present

## 2024-08-29 NOTE — Progress Notes (Signed)
 Subjective:    Patient ID: Emily Leonard, female    DOB: December 09, 1945, 78 y.o.   MRN: [id number]  HPI Discussed the use of AI scribe software for clinical note transcription with the patient, who gave verbal consent to proceed.  History of Present Illness Emily Leonard is a 78 year old female with a history of L2-S1 back fusion who presents with low back pain radiating to the left leg.  She experiences low back pain that radiates down her left leg, primarily located in the lower back, with some discomfort higher up. She experienced pain while cleaning her commode.  The pain radiates mostly down the left leg and does not affect the right leg. She underwent a significant back fusion surgery from L2 through S1 in the past, which is relevant to her current symptoms. She has had ongoing low back pain since the surgery.   Pain Inventory Average Pain 10 Pain Right Now 10 My pain is sharp, burning, stabbing, and aching  In the last 24 hours, has pain interfered with the following? General activity 8 Relation with others 8 Enjoyment of life 10 What TIME of day is your pain at its worst? morning , daytime, evening, and night Sleep (in general) Good  Pain is worse with: walking, bending, standing, and some activites Pain improves with: injections Relief from Meds: 9  Family History  Problem Relation Age of Onset   Hypertension Mother    Hypertension Father    Heart attack Father 18   Heart attack Brother 24   Social History   Socioeconomic History   Marital status: Married    Spouse name: Not on file   Number of children: 4   Years of education: Not on file   Highest education level: Not on file  Occupational History   Not on file  Tobacco Use   Smoking status: Every Day    Current packs/day: 0.75    Average packs/day: 0.8 packs/day for 40.0 years (30.0 ttl pk-yrs)    Types: Cigarettes   Smokeless tobacco: Never  Vaping Use   Vaping status: Never Used  Substance and Sexual  Activity   Alcohol use: Yes    Alcohol/week: 1.0 standard drink of alcohol    Types: 1 Standard drinks or equivalent per week    Comment: rarely   Drug use: No   Sexual activity: Not on file  Other Topics Concern   Not on file  Social History Narrative   Not on file   Social Drivers of Health   Financial Resource Strain: Low Risk (01/06/2023)   Received from [facility]   Overall Financial Resource Strain (CARDIA)    Difficulty of Paying Living Expenses: Not hard at all  Food Insecurity: No Food Insecurity (06/12/2024)   Received from [facility]   Hunger Vital Sign    Within the past 12 months, you worried that your food would run out before you got the money to buy more.: Never true    Within the past 12 months, the food you bought just didn't last and you didn't have money to get more.: Never true  Transportation Needs: No Transportation Needs (06/12/2024)   Received from [facility] - Transportation    Lack of Transportation (Medical): No    Lack of Transportation (Non-Medical): No  Physical Activity: Inactive (01/06/2023)   Received from [facility]   Exercise Vital Sign    On average, how many days per  week do you engage in moderate to strenuous exercise (like a brisk walk)?: 0 days    On average, how many minutes do you engage in exercise at this level?: 0 min  Stress: No Stress Concern Present (01/06/2023)   Received from [facility] Occupational Health - Occupational Stress Questionnaire    Feeling of Stress : Only a little  Social Connections: Socially Integrated (01/06/2023)   Received from [facility]   Social Connection and Isolation Panel    In a typical week, how many times do you talk on the phone with family, friends, or neighbors?: More than three times a week    How often do you get together with friends or relatives?: More than three times a week    How often do you attend church or religious services?: 1  to 4 times per year    Do you belong to any clubs or organizations such as church groups, unions, fraternal or athletic groups, or school groups?: Yes    How often do you attend meetings of the clubs or organizations you belong to?: 1 to 4 times per year    Are you married, widowed, divorced, separated, never married, or living with a partner?: Married   Past Surgical History:  Procedure Laterality Date   ABDOMINAL HYSTERECTOMY     ANTERIOR CERVICAL DECOMP/DISCECTOMY FUSION N/A 05/07/2017   Procedure: ANTERIOR CERVICAL DECOMPRESSION/DISCECTOMY FUSION CERVICAL FOUR- CERVICAL FIVE, CERVICAL FIVE- CERVICAL SIX, CERVICAL SIX- CERVICAL SEVEN;  Surgeon: Mavis Purchase, MD;  Location: [facility] OR;  Service: Neurosurgery;  Laterality: N/A;   ANTERIOR CERVICAL DECOMP/DISCECTOMY FUSION N/A 06/21/2017   Procedure: Revision Anterior Instrumentation Cervical four-seven;  Surgeon: Mavis Purchase, MD;  Location: [facility] OR;  Service: Neurosurgery;  Laterality: N/A;   APPENDECTOMY     CATARACT EXTRACTION, BILATERAL     CERVICAL SPINE SURGERY     COLONOSCOPY  02/2013   EXCISION/RELEASE BURSA HIP Right 09/17/2019   Procedure: Right hip bursectomy;  Surgeon: Melodi Lerner, MD;  Location: [facility] ORS;  Service: Orthopedics;  Laterality: Right;   HEMORRHOID SURGERY  03/2013   LUMBAR SPINE SURGERY     x 3   POSTERIOR CERVICAL FUSION/FORAMINOTOMY N/A 06/21/2017   Procedure: POSTERIOR CERVICAL FUSION WITH LATERAL MASS FIXATION CERVICAL FOUR- CERVICAL FIVE, CERVICAL FIVE- CERVICAL SIX, CERVICAL SIX- CERVICAL SEVEN;  Surgeon: Mavis Purchase, MD;  Location: [facility] OR;  Service: Neurosurgery;  Laterality: N/A;   Past Surgical History:  Procedure Laterality Date   ABDOMINAL HYSTERECTOMY     ANTERIOR CERVICAL DECOMP/DISCECTOMY FUSION N/A 05/07/2017   Procedure: ANTERIOR CERVICAL DECOMPRESSION/DISCECTOMY FUSION CERVICAL FOUR- CERVICAL FIVE, CERVICAL FIVE- CERVICAL SIX, CERVICAL SIX- CERVICAL SEVEN;  Surgeon: Mavis Purchase, MD;  Location:  [facility] OR;  Service: Neurosurgery;  Laterality: N/A;   ANTERIOR CERVICAL DECOMP/DISCECTOMY FUSION N/A 06/21/2017   Procedure: Revision Anterior Instrumentation Cervical four-seven;  Surgeon: Mavis Purchase, MD;  Location: [facility] OR;  Service: Neurosurgery;  Laterality: N/A;   APPENDECTOMY     CATARACT EXTRACTION, BILATERAL     CERVICAL SPINE SURGERY     COLONOSCOPY  02/2013   EXCISION/RELEASE BURSA HIP Right 09/17/2019   Procedure: Right hip bursectomy;  Surgeon: Melodi Lerner, MD;  Location: [facility] ORS;  Service: Orthopedics;  Laterality: Right;   HEMORRHOID SURGERY  03/2013   LUMBAR SPINE SURGERY     x 3   POSTERIOR CERVICAL FUSION/FORAMINOTOMY N/A 06/21/2017   Procedure: POSTERIOR CERVICAL FUSION WITH LATERAL MASS FIXATION CERVICAL FOUR- CERVICAL FIVE, CERVICAL FIVE- CERVICAL  SIX, CERVICAL SIX- CERVICAL SEVEN;  Surgeon: Mavis Purchase, MD;  Location: [facility] OR;  Service: Neurosurgery;  Laterality: N/A;   Past Medical History:  Diagnosis Date   Anxiety    Chronic pain syndrome    Dr Prentice Clapper    COPD (chronic obstructive pulmonary disease) (HCC)    Degenerative disc disease    Degenerative joint disease    Depression    Essential hypertension, benign    GERD (gastroesophageal reflux disease)    Hyperlipidemia    Hypothyroidism    Insomnia    Osteoarthritis    Palpitations    Restless leg syndrome    TIA (transient ischemic attack) unsure   BP (!) 161/97   Pulse 62   Ht 5' 4 (1.626 m)   Wt 101 lb (45.8 kg)   SpO2 92%   BMI 17.34 kg/m   Opioid Risk Score:   Fall Risk Score:  `1  Depression screen [facility] 2/9     06/05/2023   10:58 AM 08/08/2022    9:27 AM 06/20/2022   11:40 AM 12/16/2021   11:12 AM 11/29/2021    1:51 PM 09/23/2020   11:29 AM 06/10/2018   10:38 AM  Depression screen PHQ 2/9  Decreased Interest 0 1 1 0 0 0 0  Down, Depressed, Hopeless 0 1 1 0 0 0 0  PHQ - 2 Score 0 2 2 0 0 0 0     Review of Systems  Musculoskeletal:        Right foot pain Hip pain  All other  systems reviewed and are negative.      Objective:   Physical Exam  Reduced sensation bilateral small toes compared to big toes. Negative EHL weakness 4/5 strength bilateral hip flexion extensor dorsiflexor Negative straight leg raising bilaterally Ambulates without assistive device no evidence of toe drag or knee instability Lumbar spine has reduced range of motion with lumbar extension around 25% flexion is 75%  Sacral thrust (prone) : Positive bilateral Lateral compression: Negative left positive right FABER's: Negative Distraction (supine): Negative Thigh thrust test: Positive bilateral      Assessment & Plan:   Assessment and Plan Assessment & Plan Low back pain with left> right sided lumbar radiculopathy Chronic low back pain with left leg radiculopathy, post L2-S1 fusion. - Schedule S1 epidural injection on bilateral, pending insurance approval.   Patient may have some element of sacroiliac disorder as well although examination is equivocal at this time.

## 2024-08-29 NOTE — Patient Instructions (Addendum)
 Plan is for bilateral S1 epidural steroid injections   VISIT SUMMARY: You visited us  today due to low back pain that radiates to your left leg, which has been ongoing since your previous back fusion surgery. We also discussed your bilateral hip pain, which is more severe on the left side.  YOUR PLAN: LOW BACK PAIN WITH LEFT-SIDED LUMBAR RADICULOPATHY: You have chronic low back pain that radiates down your left leg, which is related to your previous back fusion surgery. -We will schedule an S1 epidural injection bilateral, pending insurance approval.  BILATERAL HIP PAIN, GREATER ON LEFT: You have pain in both hips, which is more severe on the left side and tender to touch. -We will continue to monitor your hip pain and discuss further treatment options if necessary.                      Contains text generated by Abridge.                                 Contains text generated by Abridge.

## 2024-09-16 ENCOUNTER — Telehealth: Payer: Self-pay | Admitting: Physical Medicine & Rehabilitation

## 2024-09-16 NOTE — Telephone Encounter (Signed)
 Patient is wanting to have a right hip injection with her back injection this Friday.  Are we able to do that if we get auth?  Please advise.

## 2024-09-19 ENCOUNTER — Encounter: Attending: Physical Medicine & Rehabilitation | Admitting: Physical Medicine & Rehabilitation

## 2024-09-19 ENCOUNTER — Encounter: Payer: Self-pay | Admitting: Physical Medicine & Rehabilitation

## 2024-09-19 VITALS — BP 120/83 | HR 60 | Temp 98.1°F | Ht 64.0 in | Wt 104.0 lb

## 2024-09-19 DIAGNOSIS — M5417 Radiculopathy, lumbosacral region: Secondary | ICD-10-CM | POA: Insufficient documentation

## 2024-09-19 MED ORDER — LIDOCAINE HCL (PF) 1 % IJ SOLN
5.0000 mL | Freq: Once | INTRAMUSCULAR | Status: AC
  Administered 2024-09-19: 5 mL

## 2024-09-19 MED ORDER — IOHEXOL 180 MG/ML  SOLN
3.0000 mL | Freq: Once | INTRAMUSCULAR | Status: AC
  Administered 2024-09-19: 3 mL

## 2024-09-19 MED ORDER — LIDOCAINE HCL 1 % IJ SOLN
5.0000 mL | Freq: Once | INTRAMUSCULAR | Status: AC
  Administered 2024-09-19: 5 mL

## 2024-09-19 MED ORDER — DEXAMETHASONE SOD PHOSPHATE PF 10 MG/ML IJ SOLN
20.0000 mg | Freq: Once | INTRAMUSCULAR | Status: AC
  Administered 2024-09-19: 20 mg

## 2024-09-19 NOTE — Progress Notes (Signed)
 Bilateral S1 transforaminal epidural steroid injection under fluoroscopic guidance with contrast enhancement  Indication: Lumbosacral radiculitis is not relieved by medication management or other conservative care and interfering with self-care and mobility.  Patient originally scheduled for caudal epidural steroid injection.  This was last performed in June 2021 however the patient states it was not effective.  Patient has fusion in the lumbar area.  Discussed with patient recommend bilateral S1 transforaminal epidural injections  Informed consent was obtained after describing risk and benefits of the procedure with the patient, this includes bleeding, bruising, infection, paralysis and medication side effects.  The patient wishes to proceed and has given written consent.  Patient was placed in prone position.  The lumbar area was marked and prepped with Betadine .  It was entered with a 25-gauge 1-1/2 inch needle and one mL of 1% lidocaine  was injected into the skin and subcutaneous tissue.  Then a 22-gauge 3.5 in spinal needle was inserted into the left S1  foramen under AP, lateral, and oblique view.  Once needle tip was within the foramen on lateral views an dnor exceeding 6 o clock position on the pedical on AP viewed Isovue  200 was inected x 2ml Then a solution containing one mL of 10 mg per mL dexamethasone  and 2 mL of 1% lidocaine  was injected. The same procedure and technique was used to inject the Right S1 sacral foramen The patient tolerated procedure well.  Post procedure instructions were given.  Please see post procedure form.  Also complaining of lateral hip pain on the right side previously had good results with right greater trochanteric injection.  Will schedule for repeat in 1 month   RTC 77mo

## 2024-09-19 NOTE — Progress Notes (Signed)
  PROCEDURE RECORD [facility]   Name: LAKYSHA KOSSMAN DOB:[birth year] MRN: [id number]  Date:09/19/2024  Physician: Prentice Compton MD    Nurse/CMA: Tye Kitty MA  Allergies:  Allergies  Allergen Reactions   Erythromycin Swelling    Other reaction(s): Not available   Latex Other (See Comments)    Blisters Other reaction(s): Not available   Morphine      Other reaction(s): Not available   Morphine  Sulfate Other (See Comments)    Hallucinations/deep sleep   Clozapine Rash   Fentanyl  Nausea Only    Feels like you are a hundred miles away when talking to her    Consent Signed: Yes.    Is patient diabetic? No.  CBG today? N/A  Pregnant: No. LMP: No LMP recorded. Patient has had a hysterectomy. (age 1-55)  Anticoagulants: no Anti-inflammatory: no Antibiotics: no  Procedure: Epidural Steroid Injection - Bilateral  Position: Prone Start Time: 11:57 am  End Time: 12:11  Fluoro Time: 1:11  RN/CMA Lathyn Griggs MA Charnel Giles MA    Time 11:36 AM 12:15 PM    BP 120/83 161/84    Pulse 60 64    Respirations 16 16    O2 Sat 96 95    S/S 6 6    Pain Level 9/10 0/10     D/C home with Daughter, [name], D/C instructions reviewed, and sits independently.

## 2024-09-19 NOTE — Patient Instructions (Signed)
 You received a bilateral S1 epidural steroid injection under fluoroscopic guidance. This is the most accurate way to perform an epidural injection. This injection was performed to relieve thigh or leg or foot pain that may be related to a pinched nerve in the lumbar spine. The local anesthetic injected today may cause numbness in your leg for a couple hours. If it is severe we may need to observe you for 30-60 minutes after the injection. The cortisone medicine injected today may take several days to take full effect. This medicine can also cause facial flushing or feeling of being warm.  This injection may last for days weeks or months. It can be repeated if needed. If it is not effective, another spinal level may need to be injected. Other treatments include medication management as well as physical therapy. In some cases surgery may be an option.

## 2024-11-04 ENCOUNTER — Encounter: Admitting: Physical Medicine & Rehabilitation

## 2024-12-12 ENCOUNTER — Encounter: Admitting: Physical Medicine & Rehabilitation
